# Patient Record
Sex: Female | Born: 1965 | Race: White | Hispanic: No | Marital: Single | Smoking: Current every day smoker
Health system: Southern US, Community
[De-identification: ages and names within clinical notes are randomized; demographics above are authoritative.]

## PROBLEM LIST (undated history)

## (undated) DIAGNOSIS — K449 Diaphragmatic hernia without obstruction or gangrene: Secondary | ICD-10-CM

## (undated) DIAGNOSIS — F32A Depression, unspecified: Secondary | ICD-10-CM

## (undated) DIAGNOSIS — N3281 Overactive bladder: Secondary | ICD-10-CM

## (undated) DIAGNOSIS — I10 Essential (primary) hypertension: Secondary | ICD-10-CM

## (undated) DIAGNOSIS — J439 Emphysema, unspecified: Secondary | ICD-10-CM

## (undated) DIAGNOSIS — T7840XA Allergy, unspecified, initial encounter: Secondary | ICD-10-CM

## (undated) DIAGNOSIS — J189 Pneumonia, unspecified organism: Secondary | ICD-10-CM

## (undated) DIAGNOSIS — F329 Major depressive disorder, single episode, unspecified: Secondary | ICD-10-CM

## (undated) DIAGNOSIS — J45909 Unspecified asthma, uncomplicated: Secondary | ICD-10-CM

## (undated) DIAGNOSIS — E559 Vitamin D deficiency, unspecified: Secondary | ICD-10-CM

## (undated) DIAGNOSIS — R519 Headache, unspecified: Secondary | ICD-10-CM

## (undated) DIAGNOSIS — F419 Anxiety disorder, unspecified: Secondary | ICD-10-CM

## (undated) DIAGNOSIS — Q249 Congenital malformation of heart, unspecified: Secondary | ICD-10-CM

## (undated) DIAGNOSIS — M1990 Unspecified osteoarthritis, unspecified site: Secondary | ICD-10-CM

## (undated) DIAGNOSIS — G4730 Sleep apnea, unspecified: Secondary | ICD-10-CM

## (undated) DIAGNOSIS — M542 Cervicalgia: Secondary | ICD-10-CM

## (undated) DIAGNOSIS — G4700 Insomnia, unspecified: Secondary | ICD-10-CM

## (undated) DIAGNOSIS — E079 Disorder of thyroid, unspecified: Secondary | ICD-10-CM

## (undated) DIAGNOSIS — J449 Chronic obstructive pulmonary disease, unspecified: Secondary | ICD-10-CM

## (undated) HISTORY — PX: CARDIAC CATHETERIZATION: SHX172

## (undated) HISTORY — DX: Unspecified asthma, uncomplicated: J45.909

## (undated) HISTORY — DX: Depression, unspecified: F32.A

## (undated) HISTORY — DX: Anxiety disorder, unspecified: F41.9

## (undated) HISTORY — DX: Vitamin D deficiency, unspecified: E55.9

## (undated) HISTORY — DX: Essential (primary) hypertension: I10

## (undated) HISTORY — DX: Overactive bladder: N32.81

## (undated) HISTORY — DX: Congenital malformation of heart, unspecified: Q24.9

## (undated) HISTORY — DX: Allergy, unspecified, initial encounter: T78.40XA

## (undated) HISTORY — DX: Pneumonia, unspecified organism: J18.9

## (undated) HISTORY — DX: Unspecified osteoarthritis, unspecified site: M19.90

## (undated) HISTORY — DX: Insomnia, unspecified: G47.00

## (undated) HISTORY — DX: Major depressive disorder, single episode, unspecified: F32.9

## (undated) HISTORY — DX: Disorder of thyroid, unspecified: E07.9

## (undated) HISTORY — PX: ANGIOPLASTY: SHX39

## (undated) HISTORY — DX: Emphysema, unspecified: J43.9

## (undated) HISTORY — DX: Diaphragmatic hernia without obstruction or gangrene: K44.9

## (undated) HISTORY — DX: Chronic obstructive pulmonary disease, unspecified: J44.9

---

## 1983-07-21 HISTORY — PX: CHOLECYSTECTOMY: SHX55

## 1990-07-20 HISTORY — PX: TUBAL LIGATION: SHX77

## 1991-07-21 HISTORY — PX: GYNECOLOGIC CRYOSURGERY: SHX857

## 1999-07-21 DIAGNOSIS — J45909 Unspecified asthma, uncomplicated: Secondary | ICD-10-CM

## 1999-07-21 HISTORY — DX: Unspecified asthma, uncomplicated: J45.909

## 2004-07-20 HISTORY — PX: SKIN BIOPSY: SHX1

## 2006-01-26 ENCOUNTER — Ambulatory Visit: Payer: Self-pay | Admitting: Internal Medicine

## 2006-08-23 ENCOUNTER — Emergency Department: Payer: Self-pay | Admitting: Emergency Medicine

## 2006-08-23 ENCOUNTER — Other Ambulatory Visit: Payer: Self-pay

## 2006-10-07 ENCOUNTER — Ambulatory Visit: Payer: Self-pay | Admitting: Cardiovascular Disease

## 2007-06-24 ENCOUNTER — Emergency Department: Payer: Self-pay | Admitting: Emergency Medicine

## 2007-07-07 ENCOUNTER — Emergency Department: Payer: Self-pay | Admitting: Unknown Physician Specialty

## 2007-08-15 ENCOUNTER — Ambulatory Visit: Payer: Self-pay | Admitting: Gastroenterology

## 2008-07-30 ENCOUNTER — Encounter
Admission: RE | Admit: 2008-07-30 | Discharge: 2008-10-28 | Payer: Self-pay | Admitting: Physical Medicine & Rehabilitation

## 2008-07-31 ENCOUNTER — Ambulatory Visit: Payer: Self-pay | Admitting: Physical Medicine & Rehabilitation

## 2008-09-10 ENCOUNTER — Ambulatory Visit: Payer: Self-pay | Admitting: Physical Medicine & Rehabilitation

## 2008-09-14 ENCOUNTER — Ambulatory Visit (HOSPITAL_COMMUNITY)
Admission: RE | Admit: 2008-09-14 | Discharge: 2008-09-14 | Payer: Self-pay | Admitting: Physical Medicine & Rehabilitation

## 2008-10-11 ENCOUNTER — Ambulatory Visit: Payer: Self-pay | Admitting: Physical Medicine & Rehabilitation

## 2008-11-12 ENCOUNTER — Encounter
Admission: RE | Admit: 2008-11-12 | Discharge: 2009-02-10 | Payer: Self-pay | Admitting: Physical Medicine & Rehabilitation

## 2008-11-15 ENCOUNTER — Ambulatory Visit: Payer: Self-pay | Admitting: Physical Medicine & Rehabilitation

## 2008-12-20 ENCOUNTER — Ambulatory Visit: Payer: Self-pay | Admitting: Physical Medicine & Rehabilitation

## 2009-01-14 ENCOUNTER — Ambulatory Visit: Payer: Self-pay | Admitting: Physical Medicine & Rehabilitation

## 2010-01-11 ENCOUNTER — Emergency Department: Payer: Self-pay | Admitting: Unknown Physician Specialty

## 2010-05-26 ENCOUNTER — Emergency Department: Payer: Self-pay | Admitting: Emergency Medicine

## 2010-11-11 ENCOUNTER — Ambulatory Visit: Payer: Self-pay | Admitting: Emergency Medicine

## 2010-12-02 NOTE — Assessment & Plan Note (Signed)
A 45 year old female with back pain of insidious onset greater than 17  months ago.  Treated in the past by primary care using long-acting  opioid as well as breakthrough medicine.  She is also on fairly high  dose benzodiazepine 10 mg t.i.d. and diazepam.  She has not started her  physical therapy that I wrote for last time, she states that the  therapist wanted MRI results.  She had an MRI of the knee done last  year, but the MRI of the back was done 4 or 5 years ago.  She has had an  [place] controlled drug check showing no evidence of multiple providers.  She  has lower extremity numbness, which seemed to relate to her back pain in  terms of co-existence.  She in fact has knee pain as well and sees Dr.  Darrelyn Hillock from [facility].   Her other past medical history of hypothyroidism, asthma, as well as  Plavix.   Her urine drug screen was negative, i.e., consistent with reported  usage.   Her Oswestry score today is 68%.   On examination, in general no acute distress.  Mood and affect  appropriate.  Also mildly anxious.  Extremities without edema.   Motor strength is 5/5 in bilateral deltoid, biceps, grip as well as hip  flexion, knee extension, ankle dorsiflexion.   Deep tendon reflexes are normal in upper and lower extremities.   Sensation normal in the upper and lower extremities with the exception  of decreased left L4 and L5 dermatomes.   She has full range of motion of knees.  No evidence of knee effusion.  Hip range of motion is full and pain free.  Ankle dorsiflexion and  plantar flexion are full and pain free.   IMPRESSION:  Mainly axial lumbar pain with some lower extremity  numbness, question if it is due to spinal nerve root compression versus  a peripheral neuropathy at the base of his head, some type of EMT at her  primary care's office, I do not have the results, it would have been  sometime within the last year or so.   PLAN:  1. In order to more fully  evaluate her constellation of symptoms, we      will need MRI of the lumbar spine without contrast.  2. If MRI of the lumbar spine does not show any compressive lesion      would repeat EMG.  3. The patient already has been trialed on indomethacin per      Orthopedics.  She has tried over-the-counter NSAIDs as well.  She      has tried Tramadol.  None of these have really given her      significant symptom relief.  Has tried Percocet in the past, which      has been helpful, but has not tried hydrocodone per her report.  We      will give her trial of hydrocodone 5/325 t.i.d.  Her urine drug      screen did not show any signs of abuse or misuse.  We will continue      to monitor, however.   I will see her back in about a month after the MRI, further treatment  from there.      Erick Colace, M.D.  Electronically Signed    AEK/MedQ  D:  09/10/2008 16:16:17  T:  09/11/2008 03:07:32  Job #:  [id number]

## 2010-12-02 NOTE — Group Therapy Note (Signed)
REASON FOR REFERRAL:  Back pain.   CHIEF COMPLAINT:  Back pain as well as knee pain.   HISTORY:  Bonnie Gomez is a 45 year old female who gives a history of  back pain of insidious onset of greater than 16 months ago.  She has  been treated in the past by her primary care physician and was placed on  fentanyl patch 50 mcg q.3 days as well as Percocet 10/325 #160 along  with Valium 10 mg p.o. t.i.d. and Ambien 10 mg nightly.  She also has  been treated for hypothyroidism.  She has remained on fairly high dose  of narcotic analgesics in conjunction with benzodiazepines.  Has not  been trialed on any physical therapy.  No injections.  She indicates she  has had lumbar epidural steroid injection in the past.  She has had an  MRI of the back in the past, but I do not have any record of it.  I do  have a record of right knee MRI, which she states shows some bad  arthritis, but actually the report really look normal.  She has had a  subsequent one done since the last MRI of the right knee, which was  performed on August 06, 2007.   Her average pain is 4/10.  She has been on following medications for  pain:  Indomethacin 25 mg b.i.d., Cymbalta 60 mg per day, glucosamine  with chondroitin b.i.d., Lyrica 200 mg b.i.d.  Her other medications  include Singulair, Plavix, amlodipine, Combivent, Spiriva, furosemide,  zolpidem, levothyroxine, WelChol, promethazine, omeprazole, triamterene  and hydrochlorothiazide, aspirin, and dicyclomine.  Her allergies are  none.  Other medications she has tried in the past include tramadol,  which she states does not help, and she states she has been on it for at  least a month straight without any improvement.  She says also they give  her in the emergency room when she goes there at [facility].  Her  pain diagram shows some markings in the neck area as well as low back as  well as bilateral knees and bilateral ankles.  She describes the pain as  sharp,  stabbing, and constant aching currently at a 2/10, interferes  with activity at a 6/10 level.  During the level at the 3/10, sleep is  poor.  Pains with walking, bending, and standing.  Relief from meds is  good.  She has been disabled since 2006.  She needs assist with  household duties and shopping.  Review of systems positive for shortness  of breath and sleep apnea.   PAST SURGICAL HISTORY:  Cholecystectomy.   PAST MEDICAL HISTORY:  Hypothyroidism as well as asthma.  She is on  Plavix, which she states is for heart, although she does not have any  more details.  I do not have any more detailed notes in regards to this.   She denies any illegal drug use, but did admit to getting a Percocet  from a friend of hers about a week ago.  She smokes a half-pack to a  pack per day.   FAMILY HISTORY:  Heart disease, diabetes, and high blood pressure.   PHYSICAL EXAMINATION:  VITAL SIGNS:  Her blood pressure is 121/87, pulse  90, respiratory rate is 18, and sat 89% on room air.  GENERAL:  A well-developed and well-nourished female.  Orientation x3.  Affect is alert.  Gait is normal.  EXTREMITIES:  Without edema.  Her upper and lower extremity strength is  normal.  Coordination is normal in the upper and lower extremities.  Deep tendon reflexes are normal in the upper and lower extremities.  Sensation normal in the upper and lower extremities.  MUSCULOSKELETAL:  Her lumbar spine range of motion is 50% forward  flexion, extension, lateral rotation, and bending.  Neck range of motion  is 100% forward flexion, extension, lateral rotation, and bending.  She  has mild tenderness to palpation in bilateral upper traps, bilateral  posterior paraspinal and cervical, lumbar, and thoracic regions.  No  pain in the hips.  Upper extremity range of motion normal.   IMPRESSION:  Mainly axial lumbar pain appears to be moderate.  Differential includes lumbar degenerative disk versus facet versus   sacroiliac disorder.   Of note is she has never really tried any physical therapy.  We will try  her on this for a month first.  We will check urine drug screen.  Not  convinced that narcotic analgesics have a huge will here.  We will see  her back in a month if her pain does not get better with physical  therapy, we will consider MRI lumbar spine.   In terms of her neck, really this seems to be a secondary complaint.  We  will now pursue further workup at this time.   We will ask Physical Therapy to look into a TENS unit as well.   In terms of the knees, she states she is seeing an orthopedic surgeon  here in [place], cannot give me the name.  She states that repeat  MRIs have been done showing that are severe arthritis.  Once again, I do  not have these and these do not show up on the E-chart System of [facility].      Erick Colace, M.D.  Electronically Signed     AEK/MedQ  D:  07/31/2008 16:20:23  T:  08/01/2008 06:52:52  Job #:  [id number]   cc:   Gelene Mink, MD

## 2010-12-02 NOTE — Procedures (Signed)
NAME:  Bonnie Gomez, Bonnie Gomez              ACCOUNT NO.:  [id number]   MEDICAL RECORD NO.:  [id number]          PATIENT TYPE:  REC   LOCATION:  [facility]                          FACILITY:  [facility]   PHYSICIAN:  Erick Colace, M.D.DATE OF BIRTH:  1966-03-06   DATE OF PROCEDURE:  DATE OF DISCHARGE:                               OPERATIVE REPORT   PROCEDURE:  This is a bilateral L5 dorsal ramus injection, bilateral L4  medial branch block, and bilateral L3 medial branch block under  fluoroscopic guidance.   INDICATIONS:  Lumbar pain only partially responsive to medication  management including narcotic analgesics and interfering with self-care  and mobility.  She has had 50% relief of pain with the last set of  injections on November 15, 2008.  Her current pre-injection pain level is  6/10.   Informed consent was obtained after describing the risks and benefits of  the procedure with the patient.  These include bleeding, bruising,  infection, temporary or permanent paralysis.  She elects to proceed and  has given written consent.  The patient was placed prone on fluoroscopy  table.  Betadine prep and sterile draped.  A 25-gauge 1-1/2-inch needle  was used to anesthetize the skin and subcu tissue with 1% lidocaine x2  mL.  Then, a 22-gauge 5-inch spinal needle was inserted under  fluoroscopic guidance first targeting the left S1 SAP sacral alar  junction, bone contact made, confirmed with lateral imaging.  Omnipaque  180 under live fluoro demonstrated no intravascular uptake and 0.5 mL of  solution containing 1 mL of 4 mg/mL dexamethasone, 2 mL of 0.25%  marcaine were injected.  Then, a left L5 SAP transverse process junction  targeted, bone contact made, confirmed with lateral imaging.  Omnipaque  180 under live fluoro demonstrated no intravascular uptake and 0.5 mL of  dexamethasone-marcaine solution was injected.  Then, a left L4 SAP  transverse process junction targeted, bone contact made,  confirmed with  lateral imaging.  Omnipaque 180 under live fluoro demonstrated no  intravascular uptake and 0.5 mL of dexamethasone-marcaine solution.  The  same procedure was repeated on the right side using same needle  injectate and technique.  The patient tolerated the procedure well.  Pre-  and post-injection vitals stable.  Post-injection instructions given.  Pre-injection pain level 8/10.  Post-injection pain level 5/10.  We will  follow overtime.   Consider RF.  Follow up in a month.      Erick Colace, M.D.  Electronically Signed     AEK/MEDQ  D:  12/20/2008 13:48:54  T:  12/21/2008 03:15:19  Job:  [id number]

## 2010-12-02 NOTE — Assessment & Plan Note (Signed)
Bonnie Gomez returns today follow up of her MRI of the lumbar spine.  She had not had 1 for a few years.  She has had increasing low back pain  as well as lower extremity pain.  She had some decreased left L4-L5  dermatome sensation when I last saw her on September 10, 2008.   Pain level is 5-6/10, interferes with activity at 5/10 level.  She is  able to climb steps.  She is able to drive.  She has problems with sleep  and a blood sugar related problems.   PHYSICAL EXAMINATION:  VITAL SIGNS:  Her blood pressure is 134/88, pulse  79, respirations 18, and sat 95% on room air.  GENERAL:  Obese female in no acute stress.  Orientation x3.  Affect  bright.  Gait is normal.  EXTREMITIES:  She is able to toe walk and heel walk.  She is able to do  partial squat on 1 leg on both sides.  She has good hip, knee, and ankle  range of motion.  She has a normal deep tendon reflexes in the upper  lower extremities.  NEUROLOGIC:  Sensation is intact over the lower extremities today.   I reviewed her MRI with her using a spine model to explain the results.   She has T11-12, T12-L1 shallow disk protrusions, but no significant  neural impingement.  No stenosis in the lumbar spine.  She does have  hypertrophic facet more advanced than would be expected for age at L4-5  and L5-S1.   IMPRESSION:  Lumbar spondylosis without myelopathy.  I believe that the  majority of her pain is in the lower lumbar area, which was what she  point to and what is tender to palpation.  She really does not have any  tenderness around T11-12.  She does have some tenderness around T4-5  area.   We will go ahead and set her up for medial branch blocks.  We will hold  Plavix for 5 days and switched to aspirin 1 a day.   Continue with hydrocodone 5/325 t.i.d., but add Voltaren gel to the back  q.i.d.  She is on Plavix for cardiovascular issues and do not want her  to be on long-term NSAIDs.   I discussed with the patient.  She  agrees with plan.      Erick Colace, M.D.  Electronically Signed     AEK/MedQ  D:  10/11/2008 14:58:07  T:  10/12/2008 02:04:48  Job #:  [id number]

## 2010-12-02 NOTE — Procedures (Signed)
NAME:  Bonnie Gomez, COACHMAN              ACCOUNT NO.:  [id number]   MEDICAL RECORD NO.:  [id number]          PATIENT TYPE:  REC   LOCATION:  [facility]                          FACILITY:  [facility]   PHYSICIAN:  Erick Colace, M.D.DATE OF BIRTH:  10-14-1965   DATE OF PROCEDURE:  DATE OF DISCHARGE:                               OPERATIVE REPORT   PROCEDURES:  Bilateral L5 dorsal ramus injection, bilateral L4 medial  branch block, bilateral L3 medial branch block under fluoroscopic  guidance.   INDICATIONS:  Lumbar pain.  Pain only partially response to medication  management including narcotic analgesics and interferes with self-care  and mobility.   Informed consent was obtained after describing the risks and benefits of  the procedure with the patient, these include bleeding, bruising,  infection, temporary and permanent paralysis.  She elects to proceed and  has given written consent.  The patient was placed prone on the  fluoroscopy table.  Betadine prepped and sterile draped.  A 25-gauge  inch and a half needle was used to anesthetize the skin and subcutaneous  tissue, 1% lidocaine x2 mL.  Then a 22-gauge 5-inch spinal needle was  inserted under fluoroscopic guidance, first shot in the left S1 SAP  sacral ala junction, bone contact made, confirmed with lateral imaging.  Omnipaque 180 under live fluoro demonstrated no intravascular uptake,  then 0.5 mL solution containing 1 mL of 4 mg/mL dexamethasone and 2 mL  of 2% MPF lidocaine were injected.  Then a left L5 SAP transverse  process junction targeted, bone contact made, confirmed with lateral  imaging.  Omnipaque 180 under live fluoro demonstrated no intravascular  uptake, then 0.5 of dexamethasone lidocaine solution was injected.  Then, the left L4 SAP transverse process junction targeted and bone  contact made, confirmed with lateral imaging.  Omnipaque 180 under live  fluoro demonstrated no intravascular uptake, then 0.5 mL of  dexamethasone lidocaine solution was injected.  Same procedure was  repeated on the right side using same needle injectate and technique.  The patient tolerated the procedure well.  Pre and post injection vitals  stable.  Post injection instructions given.      Erick Colace, M.D.  Electronically Signed     AEK/MEDQ  D:  11/15/2008 10:24:33  T:  11/15/2008 23:07:44  Job:  [id number]

## 2011-07-21 DIAGNOSIS — J449 Chronic obstructive pulmonary disease, unspecified: Secondary | ICD-10-CM

## 2011-07-21 HISTORY — PX: COLONOSCOPY: SHX174

## 2011-07-21 HISTORY — PX: UPPER GI ENDOSCOPY: SHX6162

## 2011-07-21 HISTORY — DX: Chronic obstructive pulmonary disease, unspecified: J44.9

## 2011-08-02 ENCOUNTER — Inpatient Hospital Stay: Payer: Self-pay | Admitting: Internal Medicine

## 2011-08-02 LAB — COMPREHENSIVE METABOLIC PANEL
Alkaline Phosphatase: 64 U/L (ref 50–136)
BUN: 6 mg/dL — ABNORMAL LOW (ref 7–18)
Bilirubin,Total: 0.6 mg/dL (ref 0.2–1.0)
Creatinine: 0.87 mg/dL (ref 0.60–1.30)
EGFR (Non-African Amer.): >60
Glucose: 106 mg/dL — ABNORMAL HIGH (ref 65–99)
SGPT (ALT): 12 U/L
Total Protein: 7.1 g/dL (ref 6.4–8.2)

## 2011-08-02 LAB — CBC
HCT: 39.9 % (ref 35.0–47.0)
HGB: 13.4 g/dL (ref 12.0–16.0)
RDW: 13.4 % (ref 11.5–14.5)
WBC: 15.4 10*3/uL — ABNORMAL HIGH (ref 3.6–11.0)

## 2011-08-02 LAB — CK TOTAL AND CKMB (NOT AT ARMC)
CK, Total: 147 U/L (ref 21–215)
CK-MB: <0.5 ng/mL — ABNORMAL LOW (ref 0.5–3.6)

## 2011-08-02 LAB — TROPONIN I: Troponin-I: <0.02 ng/mL

## 2011-08-02 LAB — RAPID INFLUENZA A&B ANTIGENS

## 2011-08-03 LAB — BASIC METABOLIC PANEL
BUN: 9 mg/dL (ref 7–18)
Calcium, Total: 8.7 mg/dL (ref 8.5–10.1)
Glucose: 155 mg/dL — ABNORMAL HIGH (ref 65–99)
Potassium: 3.8 mmol/L (ref 3.5–5.1)
Sodium: 142 mmol/L (ref 136–145)

## 2011-08-03 LAB — CBC WITH DIFFERENTIAL/PLATELET
Basophil #: 0 10*3/uL (ref 0.0–0.1)
Eosinophil %: 0 %
Lymphocyte #: 0.7 10*3/uL — ABNORMAL LOW (ref 1.0–3.6)
MCV: 95 fL (ref 80–100)
Monocyte %: 0.8 %
Neutrophil %: 94.3 %
Platelet: 216 10*3/uL (ref 150–440)
RBC: 4.37 10*6/uL (ref 3.80–5.20)
RDW: 13.3 % (ref 11.5–14.5)
WBC: 15.1 10*3/uL — ABNORMAL HIGH (ref 3.6–11.0)

## 2011-08-04 LAB — CBC WITH DIFFERENTIAL/PLATELET
Basophil #: 0.1 10*3/uL (ref 0.0–0.1)
Basophil %: 0.4 %
Eosinophil #: 0 10*3/uL (ref 0.0–0.7)
HCT: 38.4 % (ref 35.0–47.0)
MCHC: 33.6 g/dL (ref 32.0–36.0)
MCV: 96 fL (ref 80–100)
Monocyte #: 0.6 10*3/uL (ref 0.0–0.7)
Monocyte %: 3.3 %
Neutrophil #: 16.6 10*3/uL — ABNORMAL HIGH (ref 1.4–6.5)
RDW: 13.3 % (ref 11.5–14.5)
WBC: 18 10*3/uL — ABNORMAL HIGH (ref 3.6–11.0)

## 2011-08-05 LAB — WBC: WBC: 17.9 10*3/uL — ABNORMAL HIGH (ref 3.6–11.0)

## 2011-08-08 LAB — CULTURE, BLOOD (SINGLE)

## 2011-11-10 ENCOUNTER — Ambulatory Visit: Payer: Self-pay | Admitting: Cardiovascular Disease

## 2011-12-11 ENCOUNTER — Emergency Department: Payer: Self-pay | Admitting: Emergency Medicine

## 2011-12-11 LAB — CBC
HCT: 44.1 %
HGB: 14.4 g/dL
MCH: 30.4 pg
MCHC: 32.7 g/dL
MCV: 93 fL
Platelet: 236 x10 3/mm 3
RBC: 4.75 X10 6/mm 3
RDW: 13.7 %
WBC: 8.6 x10 3/mm 3

## 2011-12-11 LAB — URINALYSIS, COMPLETE
Bilirubin,UR: NEGATIVE
Glucose,UR: NEGATIVE mg/dL
Ketone: NEGATIVE
Nitrite: NEGATIVE
Ph: 5
Protein: 100
RBC,UR: 2830 /HPF
Specific Gravity: 1.016
Squamous Epithelial: 15
WBC UR: 7 /HPF

## 2011-12-11 LAB — COMPREHENSIVE METABOLIC PANEL WITH GFR
Albumin: 3.1 g/dL — ABNORMAL LOW
Alkaline Phosphatase: 75 U/L
Anion Gap: 9
BUN: 7 mg/dL
Bilirubin,Total: 0.6 mg/dL
Calcium, Total: 8.5 mg/dL
Chloride: 103 mmol/L
Co2: 28 mmol/L
Creatinine: 0.82 mg/dL
EGFR (African American): >60
EGFR (Non-African Amer.): >60
Glucose: 94 mg/dL
Osmolality: 277
Potassium: 3.8 mmol/L
SGOT(AST): 20 U/L
SGPT (ALT): 14 U/L
Sodium: 140 mmol/L
Total Protein: 7.2 g/dL

## 2011-12-11 LAB — TROPONIN I: Troponin-I: <0.02 ng/mL

## 2011-12-11 LAB — CK TOTAL AND CKMB (NOT AT ARMC)
CK, Total: 283 U/L — ABNORMAL HIGH
CK-MB: 0.9 ng/mL

## 2012-09-14 ENCOUNTER — Emergency Department: Payer: Self-pay | Admitting: Emergency Medicine

## 2012-10-18 ENCOUNTER — Emergency Department: Payer: Self-pay | Admitting: Emergency Medicine

## 2012-10-18 LAB — CBC
HCT: 44.8 % (ref 35.0–47.0)
HGB: 15.3 g/dL (ref 12.0–16.0)
RBC: 4.87 10*6/uL (ref 3.80–5.20)
RDW: 14.9 % — ABNORMAL HIGH (ref 11.5–14.5)
WBC: 9.8 10*3/uL (ref 3.6–11.0)

## 2012-10-18 LAB — URINALYSIS, COMPLETE
Glucose,UR: NEGATIVE mg/dL (ref 0–75)
Leukocyte Esterase: NEGATIVE
Nitrite: NEGATIVE
Protein: NEGATIVE
Specific Gravity: 1.018 (ref 1.003–1.030)

## 2012-10-18 LAB — BASIC METABOLIC PANEL
Anion Gap: 6 — ABNORMAL LOW (ref 7–16)
BUN: 10 mg/dL (ref 7–18)
Co2: 28 mmol/L (ref 21–32)
EGFR (African American): >60
Osmolality: 279 (ref 275–301)
Potassium: 3.7 mmol/L (ref 3.5–5.1)

## 2013-07-15 ENCOUNTER — Emergency Department: Payer: Self-pay | Admitting: Emergency Medicine

## 2013-07-15 LAB — RAPID INFLUENZA A&B ANTIGENS

## 2013-12-05 DIAGNOSIS — G4730 Sleep apnea, unspecified: Secondary | ICD-10-CM | POA: Insufficient documentation

## 2013-12-05 DIAGNOSIS — F419 Anxiety disorder, unspecified: Secondary | ICD-10-CM | POA: Insufficient documentation

## 2013-12-05 DIAGNOSIS — G43909 Migraine, unspecified, not intractable, without status migrainosus: Secondary | ICD-10-CM | POA: Insufficient documentation

## 2014-01-15 ENCOUNTER — Ambulatory Visit: Payer: Self-pay | Admitting: Gastroenterology

## 2014-01-16 LAB — PATHOLOGY REPORT

## 2014-02-13 ENCOUNTER — Ambulatory Visit: Payer: Self-pay | Admitting: Gastroenterology

## 2014-02-28 ENCOUNTER — Ambulatory Visit: Payer: Self-pay | Admitting: Gastroenterology

## 2014-03-29 ENCOUNTER — Ambulatory Visit: Payer: Self-pay | Admitting: Unknown Physician Specialty

## 2014-11-11 NOTE — Discharge Summary (Signed)
PATIENT NAME:  Bonnie Gomez, Bonnie Gomez MR#:  [id number] DATE OF BIRTH:  1966/01/21  DATE OF ADMISSION:  08/02/2011 DATE OF DISCHARGE:  08/06/2011  PRIMARY CARE PHYSICIAN: Lorelee Market, MD  REASON FOR ADMISSION: Acute respiratory failure with chronic obstructive pulmonary disease exacerbation and pneumonia.   DISCHARGE DIAGNOSES:  1. Acute on chronic hypoxic respiratory failure due to pneumonia and acute chronic obstructive pulmonary disease exacerbation. 2. Bilateral pneumonia, suspect atypical versus community-acquired pneumonia.  3. Acute hypoxic respiratory failure.  4. Leukocytosis. 5. Chronic back pain. 6. History of migraines. 7. History of fibromyalgia. 8. History of anxiety. 9. History of sleep apnea; the patient is on nocturnal oxygen. 10. Ongoing tobacco abuse.   DISCHARGE DISPOSITION: Home.   DISCHARGE MEDICATIONS:  1. Prednisone taper as written on prescription.  2. Omnicef 300 mg p.o. every 12 hours x2 days. 3. Advair Diskus 250/50 one dose inhaled twice a day. 4. Spiriva HandiHaler one cap inhaled daily.  5. Tylenol 325 mg 1 to 2 tablets p.o. every 4 to 6 hours p.r.n. pain.  6. Norco 5/325 mg one tab p.o. every 12 hours p.r.n. pain.  7. Ambien 5 mg p.o. at bedtime p.r.n. insomnia. 8. Albuterol metered dose inhaler 1 to 2 puffs inhaled every 4 to 6 hours p.r.n.  9. Valium 10 mg p.o. twice a day p.r.n.  10. Vitamin D3 5000 international units two caps p.o. daily.   DISCHARGE ACTIVITY: As tolerated.   DISCHARGE DIET: Regular.   DISCHARGE INSTRUCTIONS:  1. Take medications as prescribed.  2. Return to the emergency department for recurrence of symptoms or for worsening shortness of breath, cough, wheezing, fever, chills, or chest pain.   FOLLOWUP INSTRUCTIONS: Follow-up with Dr. Lorelee Market within 1 to 2 weeks.   REFERRALS: The patient is being referred to Dr. Humphrey Rolls of pulmonology within 2 to 3 weeks for management of chronic obstructive pulmonary disease.    PROCEDURES: Chest x-ray, PA and lateral, from 08/02/2011: Suggestion of interstitial pulmonary opacities raising the possibly of etiology of atypical infection.   PERTINENT LABS/STUDIES: Complete metabolic panel normal on admission, with the exception of serum albumin slightly low at 3.2.  Troponin negative on admission.   CBC normal on admission, except for WBC 15.4. WBC 13.2 on the day of discharge.  Blood cultures x2 from 08/02/2011: No growth to date.  Sputum culture revealed normal flora, on 08/03/2011.   Serum influenza antigen was negative for influenza A and B from 08/02/2011.  BRIEF HISTORY/HOSPITAL COURSE: The patient is a 49 year old female with past medical history of chronic obstructive pulmonary disease, ongoing tobacco abuse, chronic back pain, migraines, fibromyalgia, anxiety, sleep apnea, and ongoing tobacco abuse who presented to the emergency department with complaints of shortness of breath in addition to cough and wheezing. Please see dictated admission History and Physical for pertinent details surrounding the onset of this hospitalization and please see below for further details.  1. Acute hypoxic respiratory failure - due to pneumonia leading to acute chronic obstructive pulmonary disease exacerbation resulting in shortness of breath, cough, and wheezing. Chest x-ray was obtained in the ER and was suggestive of bilateral pulmonary opacities raising the possibility of atypical infection. Blood and sputum cultures were obtained and the patient was empirically started on IV antibiotics and admitted to the medical floor and also kept on supplemental oxygen via nasal cannula for hypoxia. She was also maintained on IV steroids for chronic obstructive pulmonary disease exacerbation in addition to bronchodilator support, and she was also maintained on Advair and  Spiriva and also given bronchodilators with nebulizers and MDIs. With the measures mentioned above, the patient's overall  clinical condition has significantly improved with resolution of her shortness of breath and wheezing and her cough has significantly improved. Blood culture did not reveal any growth to date. Sputum culture revealed normal flora. Once her condition was noted to have improved, she was transitioned off IV steroids to oral prednisone taper which she will continue as an outpatient. Her IV antibiotics were switched to p.o., which she will also continue as an outpatient. She has been successfully weaned off oxygen and has normal oxygen saturations on the day of discharge on room air at rest and also with ambulation. She has ambulated well without any shortness of breath.  2. She was noted to have leukocytosis and this could be steroid induced versus from pneumonia and her WBC count has improved overall and should hopefully continue to improve as her steroids are being tapered. She has been afebrile.  3. Her chronic back pain has been stable as have her history of migraines, fibromyalgia, and anxiety and she is to resume her home medications as before.  4. For sleep apnea, she is to continue nocturnal oxygen as previously advised. 5. For tobacco abuse, the patient was strongly counseled on the importance of smoking cessation. The patient will be referred to pulmonology as an outpatient for ongoing management of her chronic obstructive pulmonary disease.   On 08/06/2011, the patient was hemodynamically stable and without any shortness of breath or wheezing and her cough had significantly improved and she was felt to be stable for discharge home with close outpatient follow-up to which the patient was agreeable.  TIME SPENT ON DISCHARGE: Greater than 30 minutes. ____________________________ Romie Jumper, MD knl:[id number] D: 08/09/2011 23:08:16 ET     T: 08/10/2011 13:39:48 ET       JOB#: [id number] cc: Romie Jumper, MD, <Dictator> Meindert A. Brunetta Genera, MD Allyne Gee, MD Romie Jumper MD ELECTRONICALLY  SIGNED 08/18/2011 18:52

## 2014-11-11 NOTE — H&P (Signed)
PATIENT NAME:  Bonnie Gomez, Bonnie Gomez MR#:  [id number] DATE OF BIRTH:  1965-08-07  DATE OF ADMISSION:  08/02/2011  REFERRING PHYSICIAN: Dr. Benjaman Lobe  FAMILY PHYSICIAN: Dr. Brunetta Genera   REASON FOR ADMISSION: Acute respiratory failure with chronic obstructive pulmonary disease exacerbation and pneumonia.   HISTORY OF PRESENT ILLNESS: The patient is a 49 year old female with a history of chronic obstructive pulmonary disease/tobacco abuse as well as sleep apnea, on nocturnal oxygen who presents to the Emergency Room with a 1 to 2 day history of progressive shortness of breath, cough, and fatigue. In the Emergency Room, the patient was noted to be febrile and tachycardic. She was also hypoxic. She was started on oxygen and given IV steroids with multiple SVNs with no improvement of her symptoms. Chest x-ray returned abnormal consistent with atypical pneumonia. She is now admitted for further evaluation.   PAST MEDICAL HISTORY:  1. Chronic obstructive pulmonary disease/tobacco abuse.  2. Sleep apnea, on nocturnal oxygen.  3. Chronic low back pain.  4. Anxiety.  5. Migraine headaches.  6. Status post bilateral tubal ligation.  7. Status post cholecystectomy.   MEDICATIONS:  1. Ambien 10 mg p.o. at bedtime p.r.n. sleep.  2. Oxygen at 2 liters per minute per nasal cannula at bedtime.  3. Albuterol 2.5 mg SVNs every four hours p.r.n. shortness of breath.  4. Ventolin 2 puffs q.i.d.  5. Valium 10 mg p.o. b.i.d.   ALLERGIES: No known drug allergies.   SOCIAL HISTORY: The patient was smoking up until 2 to 3 days ago. Denies alcohol abuse.   FAMILY HISTORY: Positive for hypertension but otherwise unremarkable.   REVIEW OF SYSTEMS: CONSTITUTIONAL: The patient has had fever but no change in weight. EYES: No blurred or double vision. No glaucoma. ENT: No tinnitus or hearing loss. No nasal discharge or bleeding. No difficulty swallowing. RESPIRATORY: No hemoptysis. No painful respiration. CARDIOVASCULAR: No  chest pain or orthopnea. No palpitations or syncope. GI: No nausea, vomiting, or diarrhea. No abdominal pain. No change in bowel habits.  GU: No dysuria or hematuria. No incontinence. ENDOCRINE: No polyuria or polydipsia. No heat or cold intolerance. HEMATOLOGIC: The patient denies anemia, easy bruising, or bleeding. LYMPHATIC: No swollen glands. MUSCULOSKELETAL: The patient denies pain in her neck, shoulders, knees, or hips. Has chronic back pain. No gout. NEUROLOGIC: No numbness or migraines. Denies stroke or seizures. PSYCH: The patient denies anxiety, insomnia, or depression.   PHYSICAL EXAMINATION:  GENERAL: The patient is in mild respiratory distress.   VITAL SIGNS: Currently remarkable for a blood pressure of 137/83 with a heart rate of 108 and a respiratory rate of 24. Temperature is 99.3.   HEENT: Normocephalic, atraumatic. Pupils equally round and reactive to light and accommodation. Extraocular movements are intact. Sclerae are anicteric. Conjunctivae are clear.  Oropharynx is dry but clear.   NECK: Supple without jugular venous distention or bruits. No adenopathy or thyromegaly is noted.   LUNGS: Scattered wheezes and rhonchi. No dullness or rales. Respiratory effort is mildly increased.   CARDIAC: Rapid rate with a regular rhythm. Normal S1 and S2. No significant rubs, murmurs, or gallops. PMI is nondisplaced. Chest wall is nontender.   ABDOMEN: Soft, nontender, with normoactive bowel sounds. No organomegaly or masses were appreciated. No hernias or bruits were noted.   EXTREMITIES: Without clubbing, cyanosis, or edema. Pulses were 2+ bilaterally.   SKIN: Warm and dry without rash or lesions.   NEUROLOGIC: Cranial nerves II through XII grossly intact. Deep tendon reflexes were symmetric. Motor and  sensory examination is nonfocal.   PSYCH: Exam revealed a patient who was alert and oriented to person, place, and time. She was cooperative and used good judgment.   LABORATORY,  DIAGNOSTIC, AND RADIOLOGICAL DATA: EKG revealed sinus tachycardia with no acute ischemic changes. Chest x-ray revealed bilateral opacities worrisome for atypical pneumonia. White count was 15.4 with a hemoglobin of 13.4. Glucose 106 with a BUN of 6 and a creatinine of 0.87. GFR was greater than 60. Troponin was less than 0.02. Flu swab was negative.   ASSESSMENT:  1. Bilateral atypical pneumonia.  2. Acute respiratory failure.  3. Chronic obstructive pulmonary disease exacerbation. 4. Chronic back pain.  5. Anxiety.   PLAN: The patient will be admitted to the floor with oxygen as well as IV steroids, IV antibiotics, Xopenex and Atrovent SVNs. We will begin Mucinex, Advair, and Spiriva. We will send off sputum for culture. Wean oxygen as tolerated. Continue outpatient medications. Follow up chest x-ray and labs in the morning. Further treatment and evaluation will depend upon the patient's progress.   TOTAL TIME SPENT:  50 minutes.    ____________________________ Leonie Douglas Doy Hutching, MD jds:bjt D: 08/02/2011 17:43:12 ET T: 08/03/2011 06:07:11 ET JOB#: [id number]  cc: Leonie Douglas. Doy Hutching, MD, <Dictator> Meindert A. Brunetta Genera, MD Jennell Janosik Lennice Sites MD ELECTRONICALLY SIGNED 08/03/2011 11:37

## 2014-12-11 IMAGING — CR DG CHEST 2V
1 series · 2 of 2 positions shown · non-contrast
Comparison: none

REASON FOR EXAM: sob, cough, congestion
COMMENTS:   LMP: One week ago

[Series 1: w chest pa · 0.14mm/px · 2 of 2 slices shown]
[im 1/2]
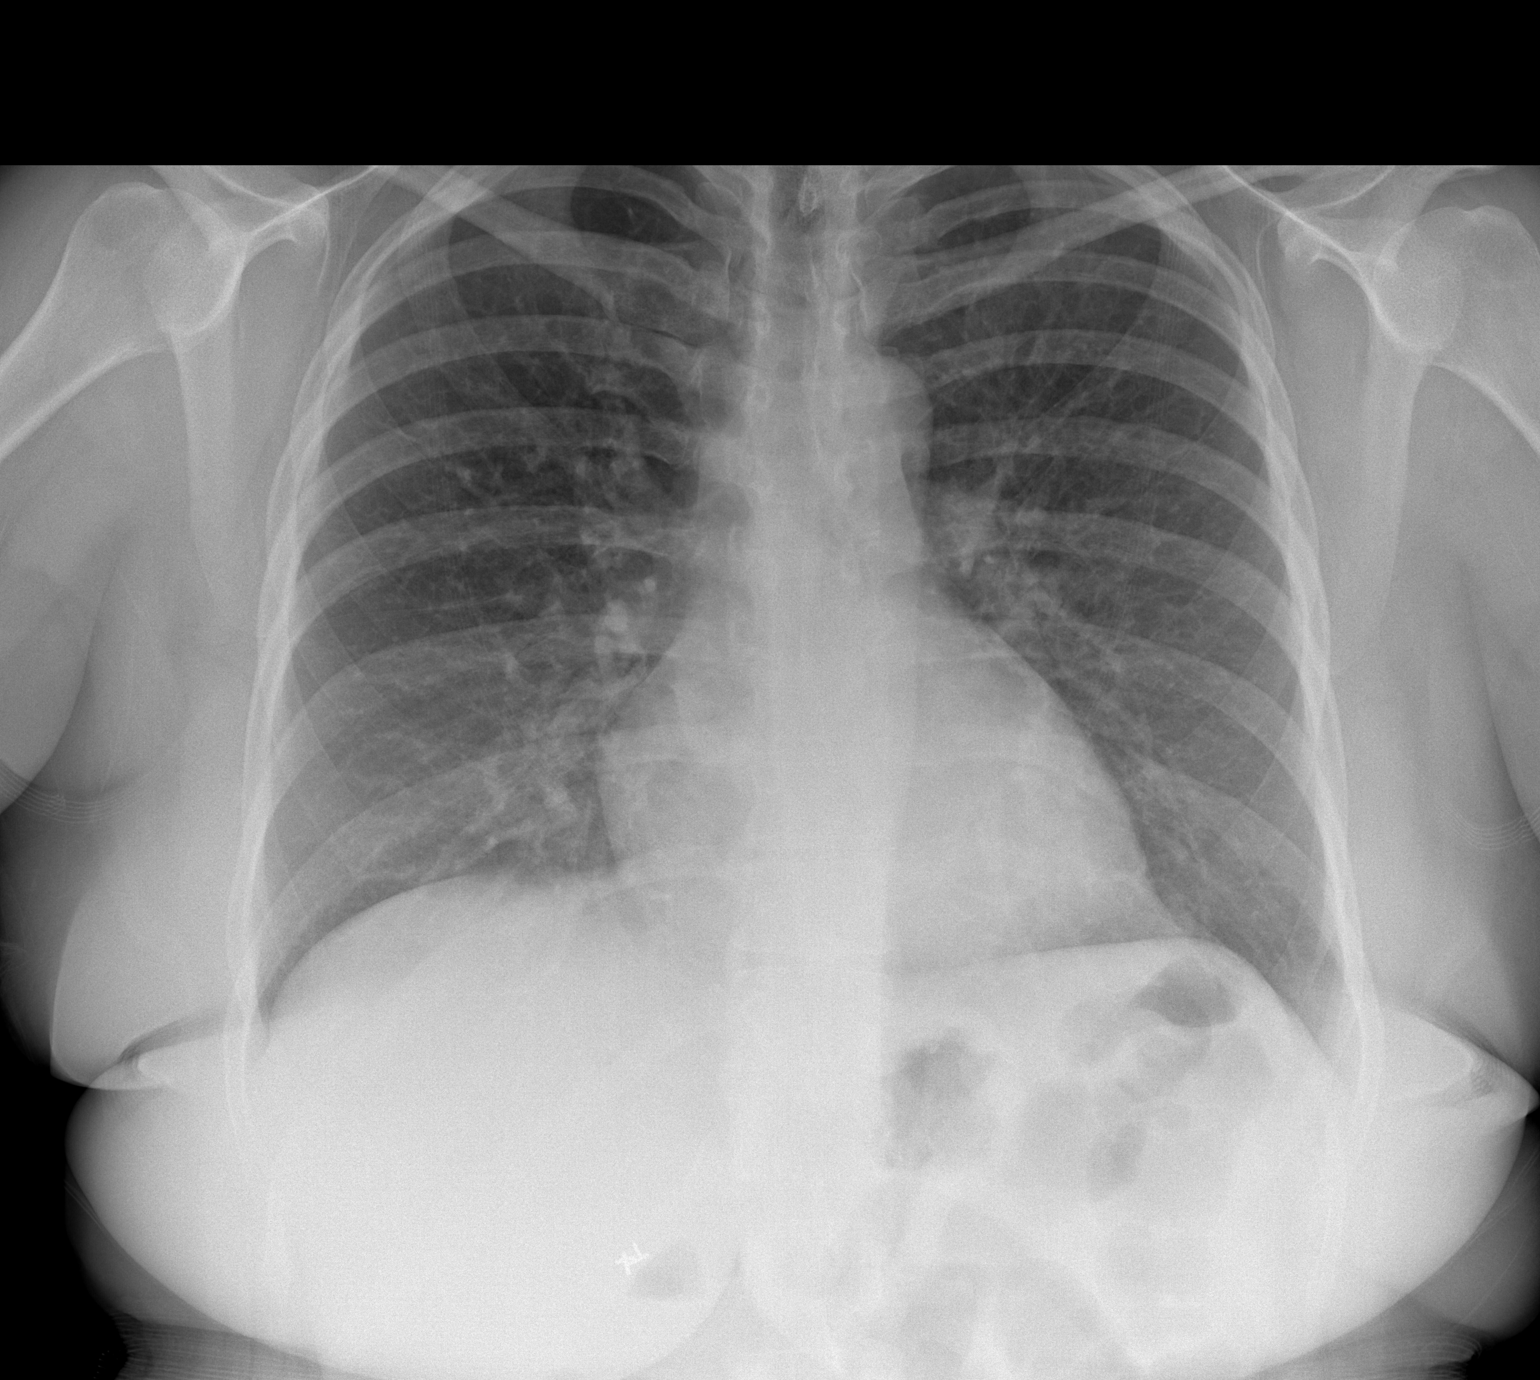
[im 2/2]
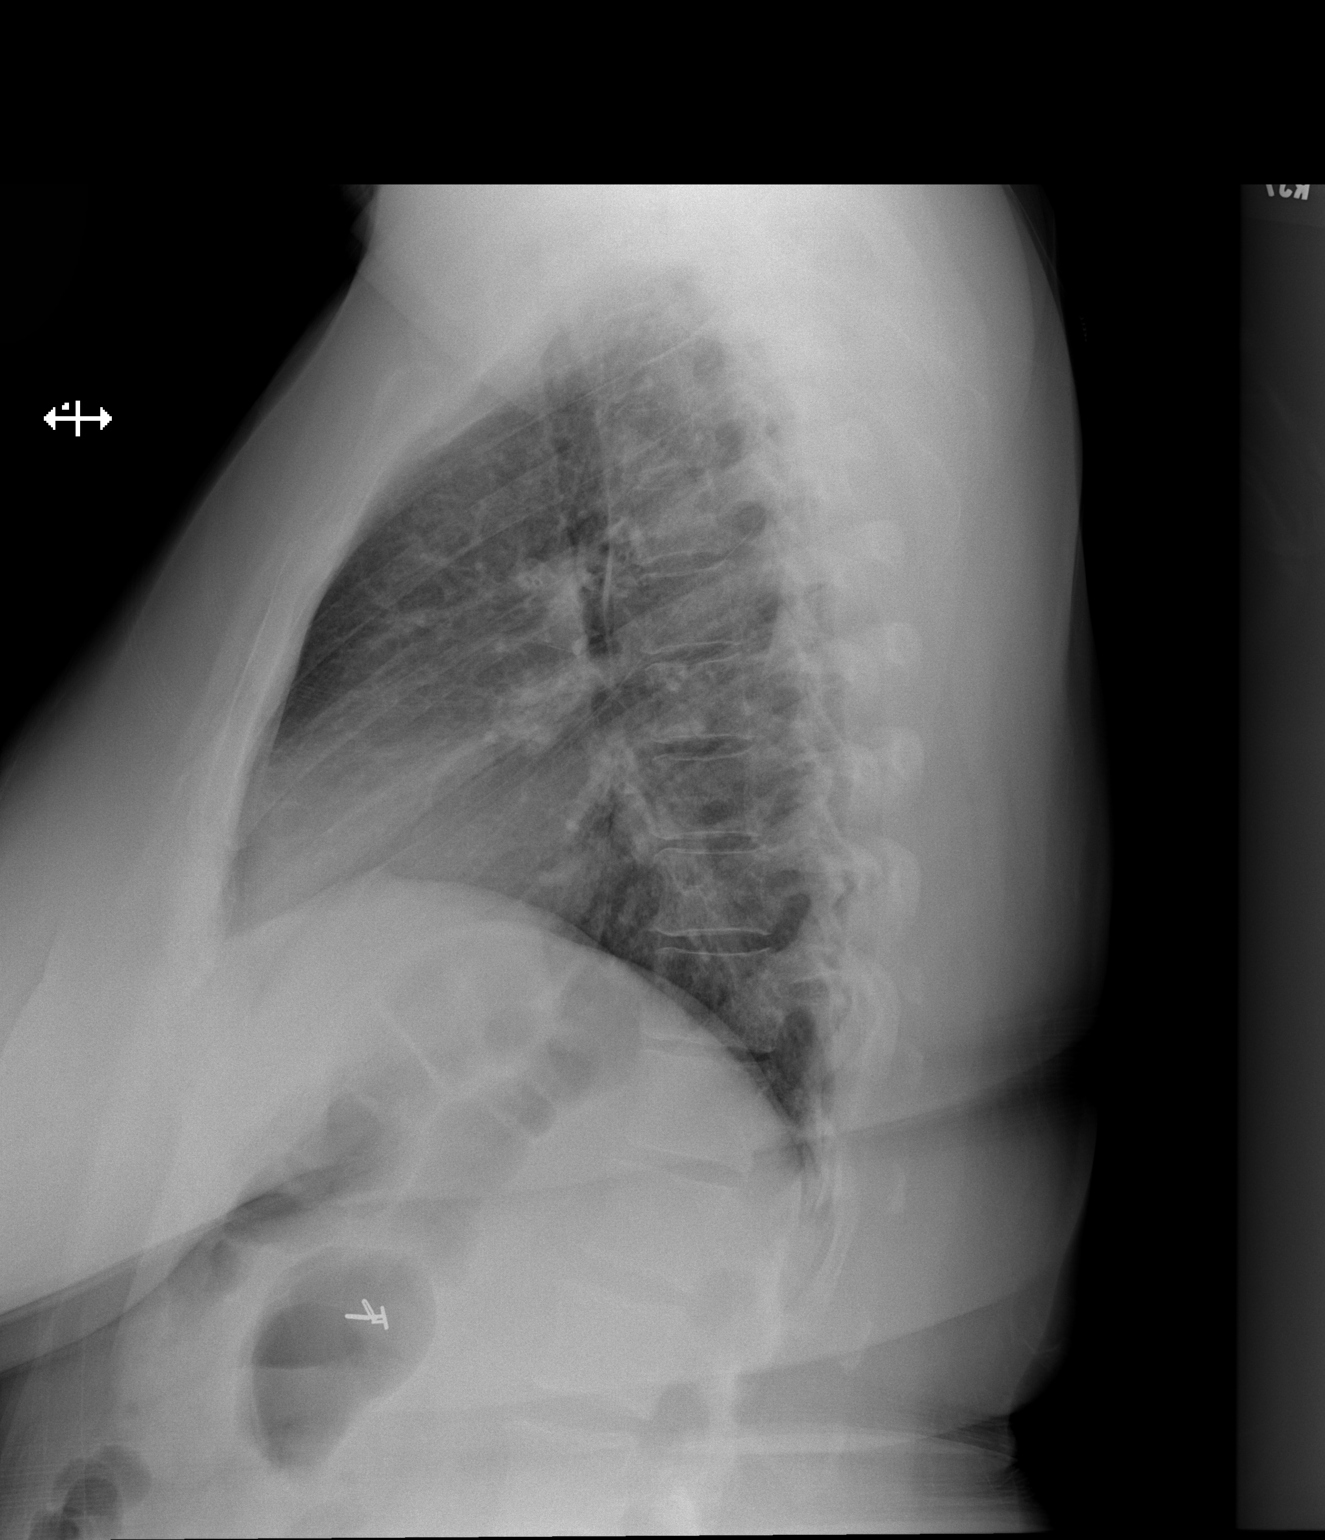

[2 of 2 positions shown; findings below may reference images not displayed]

PROCEDURE:     DXR - DXR CHEST PA (OR AP) AND LATERAL  - September 14, 2012 [DATE]

RESULT:     Comparison is made to the previous examination 11 December, 2011.
The lungs are clear. The heart and pulmonary vessels are normal. The bony
and mediastinal structures are unremarkable. There is no effusion. There is
no pneumothorax or evidence of congestive failure.
IMPRESSION: No acute cardiopulmonary disease.

[REDACTED]

## 2015-04-11 ENCOUNTER — Ambulatory Visit (INDEPENDENT_AMBULATORY_CARE_PROVIDER_SITE_OTHER): Payer: Medicare Other | Admitting: General Surgery

## 2015-04-11 ENCOUNTER — Encounter: Payer: Self-pay | Admitting: General Surgery

## 2015-04-11 VITALS — BP 124/66 | HR 60 | Resp 12 | Ht 63.0 in | Wt 193.0 lb

## 2015-04-11 DIAGNOSIS — N949 Unspecified condition associated with female genital organs and menstrual cycle: Secondary | ICD-10-CM

## 2015-04-11 DIAGNOSIS — N9089 Other specified noninflammatory disorders of vulva and perineum: Secondary | ICD-10-CM

## 2015-04-11 NOTE — Patient Instructions (Addendum)
Wait for biopsy results to determine when to reuturn

## 2015-04-11 NOTE — Progress Notes (Signed)
Patient ID: Bonnie Gomez, female   DOB: 1966/03/02, 49 y.o.   MRN: [id number]  Chief Complaint  Patient presents with  . Hemorrhoids    HPI Bonnie Gomez is a 49 y.o. female here today for an evaluation of hemorrhoids. She does report a "growth" on her rectum, does bleed and irritated. She states she has had this for about 2 years. Denies blood in her stool. She does use soap to clean area in the shower.  HPI  Past Medical History  Diagnosis Date  . 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 Pneumonia     2013  . Asthma 2001  . Arthritis   . Anxiety   . Cancer 2013    skin  . Thyroid disease   . Depression   . Hypertension   . Overactive bladder   . Allergy   . COPD (chronic obstructive pulmonary disease) 2013  . Vitamin D deficiency   . Insomnia   . Emphysema lung   . Hiatal hernia     Past Surgical History  Procedure Laterality Date  . Cholecystectomy  1985  . Tubal ligation  1992  . Angioplasty  2008, 2012  . Upper gi endoscopy  2013    [place]  . Colonoscopy  2013  . Skin biopsy  2006    Family History  Problem Relation Age of Onset  . Cancer Mother     lung  . Cancer Father     lung  . Cancer Maternal Grandmother     breast    Social History Social History  Substance Use Topics  . Smoking status: Current Every Day Smoker -- 1.00 packs/day    Types: Cigarettes  . Smokeless tobacco: None  . Alcohol Use: No    Allergies  Allergen Reactions  . Varenicline Other (See Comments)    Agitation  . Duloxetine Hcl Anxiety    Agitation    Current Outpatient Prescriptions  Medication Sig Dispense Refill  . albuterol (PROAIR HFA) 108 (90 BASE) MCG/ACT inhaler Inhale into the lungs.    Marland Kitchen albuterol (PROVENTIL) (2.5 MG/3ML) 0.083% nebulizer solution INHALE 1 VIAL USING NEBULIZER EVERY SIX HOURS AS NEEDED    . aspirin EC 81 MG tablet Take by mouth.    . BD INTEGRA SYRINGE 25G X 1" 3 ML MISC USE WITH B12  5  . CHANTIX CONTINUING MONTH PAK 1  MG tablet 1 TAB(S) ORAL TWO TIMES A DAY  3  . Cholecalciferol (D 1000) 1000 UNITS capsule Take by mouth.    . clonazePAM (KLONOPIN) 1 MG tablet Take by mouth.    . fentaNYL (DURAGESIC - DOSED MCG/HR) 75 MCG/HR PLACE ONE PATCH ONTO THE SKIN EVERY 72 HOURS  0  . fluconazole (DIFLUCAN) 150 MG tablet 1 ORAL EVERY DAY  0  . Fluticasone Furoate-Vilanterol 100-25 MCG/INH AEPB Inhale into the lungs.    . furosemide (LASIX) 40 MG tablet Take 40 mg by mouth daily.  5  . gabapentin (NEURONTIN) 300 MG capsule Take 300 mg by mouth 3 (three) times daily.  4  . ketoconazole (NIZORAL) 2 % cream APPLY A SMALL AMOUNT TOPICALLY TWO TIMES A DAY  2  . ketoconazole (NIZORAL) 2 % shampoo See admin instructions.  6  . levothyroxine (SYNTHROID, LEVOTHROID) 50 MCG tablet Take 50 mcg by mouth every morning.  2  . oxyCODONE-acetaminophen (PERCOCET) 7.5-325 MG per tablet TAKE 1 TABLET BY MOUTH EVERY 6 HOURS AND 1 TABLET AT  BEDTIME AS NEEDED  0  . progesterone (PROMETRIUM) 100 MG capsule Take 100 mg by mouth daily.  5  . tiotropium (SPIRIVA) 18 MCG inhalation capsule Place into inhaler and inhale.    . traMADol (ULTRAM) 50 MG tablet Take by mouth.    . traZODone (DESYREL) 100 MG tablet Take 300 mg by mouth.     . VESICARE 5 MG tablet      No current facility-administered medications for this visit.    Review of Systems Review of Systems  Constitutional: Negative.   Respiratory: Negative.   Cardiovascular: Negative.     Blood pressure 124/66, pulse 60, resp. rate 12, height 5\' 3"  (1.6 m), weight 193 lb (87.544 kg), SpO2 97 %.  Physical Exam Physical Exam  Constitutional: She is oriented to person, place, and time. She appears well-developed and well-nourished.  Cardiovascular: Normal rate, regular rhythm and normal heart sounds.   Pulmonary/Chest: Effort normal. She has rhonchi in the left lower field.  Abdominal: Soft. Normal appearance and normal aorta.  Genitourinary:     2x4 cm mass at the base of  sacrum   Lymphadenopathy:       Right: No inguinal adenopathy present.       Left: No inguinal adenopathy present.  Neurological: She is alert and oriented to person, place, and time.  Skin: Skin is warm and dry.  Psychiatric: Her behavior is normal.    Data Reviewed PCP notes.  Assessment    Pigmented mass of the perineum.     Plan    This is likely simply a traumatic skin tag, but the unusual pigmentation warrants biopsy prior to formal excision. The patient was amenable, and 10 mL of 0.5% Xylocaine with 0.25% Marcaine with 1-200,000 units of epinephrine was utilized. Chlor prep was applied to the skin. 2, 2.5 mm punch biopsies were obtained. The skin defect was closed with 5-0 nylon sutures. A dry dressing was applied.  The patient will be contacted when the biopsy results are retrieved. If this is a benign lesion this will likely be able to be excised as an office procedure under local anesthesia. If malignancy is identified a different approach may be necessary.       PCP:  Alfred Levins 04/11/2015, 8:26 PM

## 2015-04-15 ENCOUNTER — Telehealth: Payer: Self-pay

## 2015-04-15 NOTE — Telephone Encounter (Signed)
Notified patient as instructed, patient pleased. Discussed follow-up appointments, patient agrees. Patient scheduled for excision of area on 04/29/15.

## 2015-04-15 NOTE — Telephone Encounter (Signed)
-----   Message from Robert Bellow, MD sent at 04/15/2015  3:25 PM EDT ----- Please notify the patient the biopsy was benign. We can arrange for an office excision when convenient. ----- Message -----    From: [facility]    Sent: 04/15/2015   3:12 PM      To: Robert Bellow, MD

## 2015-04-18 ENCOUNTER — Ambulatory Visit (INDEPENDENT_AMBULATORY_CARE_PROVIDER_SITE_OTHER): Payer: Medicare Other | Admitting: *Deleted

## 2015-04-18 DIAGNOSIS — N9089 Other specified noninflammatory disorders of vulva and perineum: Secondary | ICD-10-CM

## 2015-04-18 DIAGNOSIS — N949 Unspecified condition associated with female genital organs and menstrual cycle: Secondary | ICD-10-CM

## 2015-04-18 NOTE — Progress Notes (Signed)
Patient came in today for a wound check/biopsy site.  The nodule is clean, with no signs of infection noted. Plan for office excision. Follow up as scheduled. The patient is aware to call back for any questions or concerns.

## 2015-04-18 NOTE — Patient Instructions (Signed)
The patient is aware to call back for any questions or concerns.  

## 2015-04-29 ENCOUNTER — Ambulatory Visit (INDEPENDENT_AMBULATORY_CARE_PROVIDER_SITE_OTHER): Payer: Medicare Other | Admitting: General Surgery

## 2015-04-29 ENCOUNTER — Encounter: Payer: Self-pay | Admitting: General Surgery

## 2015-04-29 ENCOUNTER — Ambulatory Visit: Payer: Medicare Other | Admitting: General Surgery

## 2015-04-29 VITALS — BP 132/74 | HR 82 | Resp 14 | Wt 189.0 lb

## 2015-04-29 DIAGNOSIS — D215 Benign neoplasm of connective and other soft tissue of pelvis: Secondary | ICD-10-CM | POA: Diagnosis not present

## 2015-04-29 DIAGNOSIS — A630 Anogenital (venereal) warts: Secondary | ICD-10-CM

## 2015-04-29 DIAGNOSIS — N9089 Other specified noninflammatory disorders of vulva and perineum: Secondary | ICD-10-CM

## 2015-04-29 MED ORDER — HYDROCODONE-ACETAMINOPHEN 5-325 MG PO TABS: 1.0000 | ORAL_TABLET | ORAL | Status: DC | PRN

## 2015-04-29 NOTE — Progress Notes (Signed)
Patient ID: Bonnie Gomez, female   DOB: August 25, 1965, 49 y.o.   MRN: [id number]  Chief Complaint  Patient presents with  . Procedure    mass exc    HPI Bonnie Gomez is a 49 y.o. female here today for a perirenal mass excision. Previous punch biopsy showed that this was a condyloma without evidence of malignancy. HPI  Past Medical History  Diagnosis Date  . 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 Pneumonia     2013  . Asthma 2001  . Arthritis   . Anxiety   . Cancer ([facility]) 2013    skin  . Thyroid disease   . Depression   . Hypertension   . Overactive bladder   . Allergy   . COPD (chronic obstructive pulmonary disease) ([place]) 2013  . Vitamin D deficiency   . Insomnia   . Emphysema lung ([place])   . Hiatal hernia     Past Surgical History  Procedure Laterality Date  . Cholecystectomy  1985  . Tubal ligation  1992  . Angioplasty  2008, 2012  . Upper gi endoscopy  2013    [facility]  . Colonoscopy  2013  . Skin biopsy  2006    Family History  Problem Relation Age of Onset  . Cancer Mother     lung  . Cancer Father     lung  . Cancer Maternal Grandmother     breast    Social History Social History  Substance Use Topics  . Smoking status: Current Every Day Smoker -- 1.00 packs/day    Types: Cigarettes  . Smokeless tobacco: None  . Alcohol Use: No    Allergies  Allergen Reactions  . Varenicline Other (See Comments)    Agitation  . Duloxetine Hcl Anxiety    Agitation    Current Outpatient Prescriptions  Medication Sig Dispense Refill  . albuterol (PROAIR HFA) 108 (90 BASE) MCG/ACT inhaler Inhale into the lungs.    Marland Kitchen albuterol (PROVENTIL) (2.5 MG/3ML) 0.083% nebulizer solution INHALE 1 VIAL USING NEBULIZER EVERY SIX HOURS AS NEEDED    . aspirin EC 81 MG tablet Take by mouth.    . BD INTEGRA SYRINGE 25G X 1" 3 ML MISC USE WITH B12  5  . CHANTIX CONTINUING MONTH PAK 1 MG tablet 1 TAB(S) ORAL TWO TIMES A DAY  3  . Cholecalciferol (D 1000) 1000  UNITS capsule Take by mouth.    . clonazePAM (KLONOPIN) 1 MG tablet Take by mouth.    . fentaNYL (DURAGESIC - DOSED MCG/HR) 75 MCG/HR PLACE ONE PATCH ONTO THE SKIN EVERY 72 HOURS  0  . fluconazole (DIFLUCAN) 150 MG tablet 1 ORAL EVERY DAY  0  . Fluticasone Furoate-Vilanterol 100-25 MCG/INH AEPB Inhale into the lungs.    . furosemide (LASIX) 40 MG tablet Take 40 mg by mouth daily.  5  . gabapentin (NEURONTIN) 300 MG capsule Take 300 mg by mouth 3 (three) times daily.  4  . HYDROcodone-acetaminophen (NORCO) 5-325 MG tablet Take 1-2 tablets by mouth every 4 (four) hours as needed for moderate pain. 20 tablet 0  . ketoconazole (NIZORAL) 2 % cream APPLY A SMALL AMOUNT TOPICALLY TWO TIMES A DAY  2  . ketoconazole (NIZORAL) 2 % shampoo See admin instructions.  6  . levothyroxine (SYNTHROID, LEVOTHROID) 50 MCG tablet Take 50 mcg by mouth every morning.  2  . oxyCODONE-acetaminophen (PERCOCET) 7.5-325 MG per tablet TAKE 1 TABLET BY MOUTH  EVERY 6 HOURS AND 1 TABLET AT BEDTIME AS NEEDED  0  . progesterone (PROMETRIUM) 100 MG capsule Take 100 mg by mouth daily.  5  . tiotropium (SPIRIVA) 18 MCG inhalation capsule Place into inhaler and inhale.    . traMADol (ULTRAM) 50 MG tablet Take by mouth.    . traZODone (DESYREL) 100 MG tablet Take 300 mg by mouth.     . VESICARE 5 MG tablet      No current facility-administered medications for this visit.    Review of Systems Review of Systems  Constitutional: Negative.   Respiratory: Negative.   Cardiovascular: Negative.     Blood pressure 132/74, pulse 82, resp. rate 14, weight 189 lb (85.73 kg).  Physical Exam Physical Exam  Genitourinary:       Data Reviewed Condyloma on biopsy.  Assessment    Condyloma of the perineum, symptomatic.    Plan    The plans for excision was reviewed. A total of 20 mL of 0.5% Xylocaine with 0.25% Marcaine with 1-200,000 of epinephrine was utilized well tolerated. Chlor prep was applied pre-and post local and  acetic injection. The area was excised through elliptical incision. Hemostasis was with interrupted 4-0 Vicryls figure-of-eight sutures. The wound was closed in multiple layers with interrupted 4-0 Vicryls figure-of-eight sutures. The skin was closed with a running 4-0 Vicryls subcuticular suture. Telfa and Tegaderm applied.  A prescription for Norco 5/325, #20 with the inscription one 1-2 by mouth every 4 hours when necessary for pain was provided, no refills. The patient reports that she is no longer making use of Percocet 7.5 as noted on her medication list.  She will follow-up in 8 days for wound evaluation with the nurse.     PCP: Threasa Alpha, Np   Robert Bellow 04/29/2015, 4:18 PM

## 2015-04-29 NOTE — Patient Instructions (Addendum)
Patient to return in eight days.

## 2015-05-02 ENCOUNTER — Telehealth: Payer: Self-pay | Admitting: *Deleted

## 2015-05-02 NOTE — Telephone Encounter (Signed)
-----   Message from Robert Bellow, MD sent at 05/02/2015  2:25 PM EDT ----- Please notify the patient that the biopsy did not show any cancer. Follow-up as previously scheduled. ----- Message -----    From: [facility]    Sent: 05/02/2015  11:46 AM      To: Robert Bellow, MD

## 2015-05-02 NOTE — Telephone Encounter (Signed)
Notified patient as instructed, patient pleased. Discussed follow-up appointments, patient agrees  

## 2015-05-07 ENCOUNTER — Ambulatory Visit (INDEPENDENT_AMBULATORY_CARE_PROVIDER_SITE_OTHER): Payer: Medicare Other | Admitting: *Deleted

## 2015-05-07 DIAGNOSIS — N949 Unspecified condition associated with female genital organs and menstrual cycle: Secondary | ICD-10-CM

## 2015-05-07 DIAGNOSIS — N9089 Other specified noninflammatory disorders of vulva and perineum: Secondary | ICD-10-CM

## 2015-05-07 NOTE — Progress Notes (Signed)
Patient came in today for a wound check.  The wound is clean, with no signs of infection noted. Follow up as needed.  

## 2015-11-21 ENCOUNTER — Other Ambulatory Visit: Payer: Self-pay | Admitting: Dentistry

## 2015-11-21 DIAGNOSIS — J449 Chronic obstructive pulmonary disease, unspecified: Secondary | ICD-10-CM

## 2015-12-17 ENCOUNTER — Other Ambulatory Visit: Payer: Self-pay | Admitting: Dentistry

## 2015-12-17 ENCOUNTER — Ambulatory Visit
Admission: RE | Admit: 2015-12-17 | Discharge: 2015-12-17 | Disposition: A | Discharge status: Home / Self Care | Payer: Disability Insurance | Source: Ambulatory Visit | Attending: Dentistry | Admitting: Dentistry

## 2015-12-17 ENCOUNTER — Ambulatory Visit (HOSPITAL_COMMUNITY): Payer: Disability Insurance

## 2015-12-17 DIAGNOSIS — M5136 Other intervertebral disc degeneration, lumbar region: Secondary | ICD-10-CM | POA: Insufficient documentation

## 2015-12-17 DIAGNOSIS — M1990 Unspecified osteoarthritis, unspecified site: Secondary | ICD-10-CM

## 2015-12-17 DIAGNOSIS — M5126 Other intervertebral disc displacement, lumbar region: Secondary | ICD-10-CM

## 2015-12-17 DIAGNOSIS — M797 Fibromyalgia: Secondary | ICD-10-CM | POA: Insufficient documentation

## 2015-12-17 DIAGNOSIS — J449 Chronic obstructive pulmonary disease, unspecified: Secondary | ICD-10-CM

## 2016-02-29 ENCOUNTER — Emergency Department: Payer: Medicare Other

## 2016-02-29 ENCOUNTER — Inpatient Hospital Stay
Admission: EM | Admit: 2016-02-29 | Discharge: 2016-03-01 | DRG: 190 | Disposition: A | Discharge status: Home / Self Care | Payer: Medicare Other | Attending: Internal Medicine | Admitting: Internal Medicine

## 2016-02-29 ENCOUNTER — Encounter: Payer: Self-pay | Admitting: Emergency Medicine

## 2016-02-29 DIAGNOSIS — I10 Essential (primary) hypertension: Secondary | ICD-10-CM | POA: Diagnosis present

## 2016-02-29 DIAGNOSIS — Z79891 Long term (current) use of opiate analgesic: Secondary | ICD-10-CM | POA: Diagnosis not present

## 2016-02-29 DIAGNOSIS — F329 Major depressive disorder, single episode, unspecified: Secondary | ICD-10-CM | POA: Diagnosis present

## 2016-02-29 DIAGNOSIS — J9601 Acute respiratory failure with hypoxia: Secondary | ICD-10-CM | POA: Diagnosis present

## 2016-02-29 DIAGNOSIS — F17210 Nicotine dependence, cigarettes, uncomplicated: Secondary | ICD-10-CM | POA: Diagnosis present

## 2016-02-29 DIAGNOSIS — R0600 Dyspnea, unspecified: Secondary | ICD-10-CM

## 2016-02-29 DIAGNOSIS — Z79899 Other long term (current) drug therapy: Secondary | ICD-10-CM | POA: Diagnosis not present

## 2016-02-29 DIAGNOSIS — Z888 Allergy status to other drugs, medicaments and biological substances status: Secondary | ICD-10-CM

## 2016-02-29 DIAGNOSIS — F419 Anxiety disorder, unspecified: Secondary | ICD-10-CM | POA: Diagnosis present

## 2016-02-29 DIAGNOSIS — Z809 Family history of malignant neoplasm, unspecified: Secondary | ICD-10-CM

## 2016-02-29 DIAGNOSIS — J441 Chronic obstructive pulmonary disease with (acute) exacerbation: Principal | ICD-10-CM

## 2016-02-29 DIAGNOSIS — N3281 Overactive bladder: Secondary | ICD-10-CM | POA: Diagnosis present

## 2016-02-29 DIAGNOSIS — E039 Hypothyroidism, unspecified: Secondary | ICD-10-CM | POA: Diagnosis present

## 2016-02-29 DIAGNOSIS — E559 Vitamin D deficiency, unspecified: Secondary | ICD-10-CM | POA: Diagnosis present

## 2016-02-29 LAB — COMPREHENSIVE METABOLIC PANEL
ALT: 11 U/L — ABNORMAL LOW (ref 14–54)
ANION GAP: 8 (ref 5–15)
AST: 16 U/L (ref 15–41)
Albumin: 3.3 g/dL — ABNORMAL LOW (ref 3.5–5.0)
Alkaline Phosphatase: 66 U/L (ref 38–126)
BUN: 8 mg/dL (ref 6–20)
CHLORIDE: 100 mmol/L — AB (ref 101–111)
CO2: 28 mmol/L (ref 22–32)
CREATININE: 0.87 mg/dL (ref 0.44–1.00)
Calcium: 8.6 mg/dL — ABNORMAL LOW (ref 8.9–10.3)
Glucose, Bld: 147 mg/dL — ABNORMAL HIGH (ref 65–99)
POTASSIUM: 3.9 mmol/L (ref 3.5–5.1)
SODIUM: 136 mmol/L (ref 135–145)
Total Bilirubin: 1 mg/dL (ref 0.3–1.2)
Total Protein: 6.7 g/dL (ref 6.5–8.1)

## 2016-02-29 LAB — TROPONIN I: Troponin I: <0.03 ng/mL (ref <0.03)

## 2016-02-29 LAB — CBC WITH DIFFERENTIAL/PLATELET
Basophils Absolute: 0.1 10*3/uL (ref 0–0.1)
Basophils Relative: 1 %
EOS ABS: 0.2 10*3/uL (ref 0–0.7)
EOS PCT: 2 %
HCT: 46 % (ref 35.0–47.0)
Hemoglobin: 16 g/dL (ref 12.0–16.0)
LYMPHS ABS: 2.7 10*3/uL (ref 1.0–3.6)
LYMPHS PCT: 23 %
MCH: 31.7 pg (ref 26.0–34.0)
MCHC: 34.7 g/dL (ref 32.0–36.0)
MCV: 91.5 fL (ref 80.0–100.0)
MONO ABS: 0.9 10*3/uL (ref 0.2–0.9)
Monocytes Relative: 8 %
Neutro Abs: 7.6 10*3/uL — ABNORMAL HIGH (ref 1.4–6.5)
Neutrophils Relative %: 66 %
PLATELETS: 253 10*3/uL (ref 150–440)
RBC: 5.03 MIL/uL (ref 3.80–5.20)
RDW: 13.7 % (ref 11.5–14.5)
WBC: 11.4 10*3/uL — AB (ref 3.6–11.0)

## 2016-02-29 LAB — BRAIN NATRIURETIC PEPTIDE: B Natriuretic Peptide: 45 pg/mL (ref 0.0–100.0)

## 2016-02-29 MED ORDER — ONDANSETRON HCL 4 MG/2ML IJ SOLN: 4.0000 mg | Freq: Four times a day (QID) | INTRAMUSCULAR | Status: DC | PRN

## 2016-02-29 MED ORDER — DARIFENACIN HYDROBROMIDE ER 7.5 MG PO TB24
7.5000 mg | ORAL_TABLET | Freq: Every day | ORAL | Status: DC
  Administered 2016-02-29 – 2016-03-01 (×2): 7.5 mg via ORAL
  Filled 2016-02-29 (×2): qty 1

## 2016-02-29 MED ORDER — TRAZODONE HCL 100 MG PO TABS: 300.0000 mg | ORAL_TABLET | Freq: Every day | ORAL | Status: DC

## 2016-02-29 MED ORDER — ACETAMINOPHEN 325 MG PO TABS: 650.0000 mg | ORAL_TABLET | Freq: Four times a day (QID) | ORAL | Status: DC | PRN

## 2016-02-29 MED ORDER — METHYLPREDNISOLONE SODIUM SUCC 125 MG IJ SOLR
60.0000 mg | INTRAMUSCULAR | Status: DC
  Administered 2016-03-01: 60 mg via INTRAVENOUS
  Filled 2016-02-29: qty 2

## 2016-02-29 MED ORDER — TIOTROPIUM BROMIDE MONOHYDRATE 18 MCG IN CAPS
18.0000 ug | ORAL_CAPSULE | Freq: Every day | RESPIRATORY_TRACT | Status: DC
  Administered 2016-03-01: 18 ug via RESPIRATORY_TRACT
  Filled 2016-02-29: qty 5

## 2016-02-29 MED ORDER — PROGESTERONE MICRONIZED 100 MG PO CAPS
100.0000 mg | ORAL_CAPSULE | Freq: Every day | ORAL | Status: DC
  Administered 2016-03-01: 100 mg via ORAL
  Filled 2016-02-29 (×2): qty 1

## 2016-02-29 MED ORDER — ASPIRIN EC 81 MG PO TBEC
81.0000 mg | DELAYED_RELEASE_TABLET | Freq: Every day | ORAL | Status: DC
  Administered 2016-03-01: 81 mg via ORAL
  Filled 2016-02-29: qty 1

## 2016-02-29 MED ORDER — LEVOTHYROXINE SODIUM 50 MCG PO TABS
50.0000 ug | ORAL_TABLET | Freq: Every morning | ORAL | Status: DC
  Administered 2016-03-01: 50 ug via ORAL
  Filled 2016-02-29: qty 1

## 2016-02-29 MED ORDER — METHYLPREDNISOLONE SODIUM SUCC 125 MG IJ SOLR
125.0000 mg | Freq: Once | INTRAMUSCULAR | Status: AC
  Administered 2016-02-29: 125 mg via INTRAVENOUS
  Filled 2016-02-29: qty 2

## 2016-02-29 MED ORDER — FENTANYL 75 MCG/HR TD PT72
75.0000 ug | MEDICATED_PATCH | TRANSDERMAL | Status: DC
  Administered 2016-02-29: 75 ug via TRANSDERMAL
  Filled 2016-02-29: qty 1

## 2016-02-29 MED ORDER — IPRATROPIUM-ALBUTEROL 0.5-2.5 (3) MG/3ML IN SOLN
3.0000 mL | Freq: Once | RESPIRATORY_TRACT | Status: AC
  Administered 2016-02-29: 3 mL via RESPIRATORY_TRACT
  Filled 2016-02-29: qty 3

## 2016-02-29 MED ORDER — ENOXAPARIN SODIUM 40 MG/0.4ML ~~LOC~~ SOLN
40.0000 mg | SUBCUTANEOUS | Status: DC
  Administered 2016-02-29: 40 mg via SUBCUTANEOUS
  Filled 2016-02-29: qty 0.4

## 2016-02-29 MED ORDER — ONDANSETRON HCL 4 MG PO TABS: 4.0000 mg | ORAL_TABLET | Freq: Four times a day (QID) | ORAL | Status: DC | PRN

## 2016-02-29 MED ORDER — AZITHROMYCIN 250 MG PO TABS
250.0000 mg | ORAL_TABLET | Freq: Every day | ORAL | Status: DC
  Administered 2016-02-29 – 2016-03-01 (×2): 250 mg via ORAL
  Filled 2016-02-29 (×2): qty 1

## 2016-02-29 MED ORDER — CLONAZEPAM 1 MG PO TABS
1.0000 mg | ORAL_TABLET | Freq: Two times a day (BID) | ORAL | Status: DC | PRN
  Administered 2016-02-29: 1 mg via ORAL
  Filled 2016-02-29: qty 1

## 2016-02-29 MED ORDER — FLUTICASONE FUROATE-VILANTEROL 100-25 MCG/INH IN AEPB
1.0000 | INHALATION_SPRAY | Freq: Every day | RESPIRATORY_TRACT | Status: DC
Start: 2016-02-29 — End: 2016-03-01
  Administered 2016-03-01: 1 via RESPIRATORY_TRACT
  Filled 2016-02-29: qty 28

## 2016-02-29 MED ORDER — GUAIFENESIN-CODEINE 100-10 MG/5ML PO SOLN
10.0000 mL | ORAL | Status: DC | PRN
  Administered 2016-03-01 (×2): 10 mL via ORAL
  Filled 2016-02-29 (×2): qty 10

## 2016-02-29 MED ORDER — OXYCODONE HCL 5 MG PO TABS: 5.0000 mg | ORAL_TABLET | ORAL | Status: DC | PRN

## 2016-02-29 MED ORDER — ACETAMINOPHEN 650 MG RE SUPP
650.0000 mg | Freq: Four times a day (QID) | RECTAL | Status: DC | PRN
Start: 2016-02-29 — End: 2016-03-01

## 2016-02-29 MED ORDER — IPRATROPIUM-ALBUTEROL 0.5-2.5 (3) MG/3ML IN SOLN
3.0000 mL | RESPIRATORY_TRACT | Status: DC
  Administered 2016-03-01 (×4): 3 mL via RESPIRATORY_TRACT
  Filled 2016-02-29 (×4): qty 3

## 2016-02-29 MED ORDER — GABAPENTIN 300 MG PO CAPS
300.0000 mg | ORAL_CAPSULE | Freq: Three times a day (TID) | ORAL | Status: DC
  Administered 2016-02-29 – 2016-03-01 (×2): 300 mg via ORAL
  Filled 2016-02-29 (×2): qty 1

## 2016-02-29 MED ORDER — FUROSEMIDE 40 MG PO TABS
40.0000 mg | ORAL_TABLET | Freq: Every day | ORAL | Status: DC
  Administered 2016-03-01: 40 mg via ORAL
  Filled 2016-02-29: qty 1

## 2016-02-29 NOTE — ED Triage Notes (Signed)
States SOB x 2 days, nausea and vomiting today.

## 2016-02-29 NOTE — ED Provider Notes (Signed)
[facility] Emergency Department Provider Note        Time seen: ----------------------------------------- 6:36 PM on 02/29/2016 -----------------------------------------    I have reviewed the triage vital signs and the nursing notes.   HISTORY  Chief Complaint Shortness of Breath    HPI Bonnie Gomez is a 50 y.o. female who presents to ER with shortness of breath for 2 days. She's had nausea and vomiting today.Patient states she was seen by her pulmonologist 12 days ago and started on Levaquin and prednisone. She finished the prednisone 4 days ago and Levaquin 2 days ago. She has not had any improvement in her shortness of breath. She still been coughing, today acutely she began throwing up. She denies any fevers, has been using inhalers without improving.   Past Medical History:  Diagnosis Date  . Allergy   . Anxiety   . Arthritis   . Asthma 2001  . Cancer ([place]) 2013   skin  . COPD (chronic obstructive pulmonary disease) ([place]) 2013  . Depression   . Emphysema lung ([place])   . 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 Hiatal hernia   . Hypertension   . Insomnia   . Overactive bladder   . Pneumonia    2013  . Thyroid disease   . Vitamin D deficiency     Patient Active Problem List   Diagnosis Date Noted  . Condyloma acuminatum in female 04/29/2015  . Perineal mass in female 04/11/2015    Past Surgical History:  Procedure Laterality Date  . ANGIOPLASTY  2008, 2012  . CHOLECYSTECTOMY  1985  . COLONOSCOPY  2013  . SKIN BIOPSY  2006  . TUBAL LIGATION  1992  . UPPER GI ENDOSCOPY  2013   [facility]    Allergies Varenicline and Duloxetine hcl  Social History Social History  Substance Use Topics  . Smoking status: Current Every Day Smoker    Packs/day: 0.50    Types: Cigarettes  . Smokeless tobacco: Not on file  . Alcohol use No    Review of Systems Constitutional: Negative for fever. Eyes: Negative for visual  changes. ENT: Positive for sinus congestion Cardiovascular: Negative for chest pain. Respiratory: Positive for shortness of breath, cough Gastrointestinal: Negative for abdominal pain, positive for vomiting Genitourinary: Negative for dysuria. Musculoskeletal: Negative for back pain. Skin: Negative for rash. Neurological: Negative for headaches, positive for weakness  10-point ROS otherwise negative.  ____________________________________________   PHYSICAL EXAM:  VITAL SIGNS: ED Triage Vitals  Enc Vitals Group     BP 02/29/16 1816 (!) 154/99     Pulse Rate 02/29/16 1816 90     Resp 02/29/16 1816 18     Temp 02/29/16 1816 98 F (36.7 C)     Temp Source 02/29/16 1816 Oral     SpO2 02/29/16 1816 90 %     Weight 02/29/16 1817 195 lb (88.5 kg)     Height 02/29/16 1817 5\' 3"  (1.6 m)     Head Circumference --      Peak Flow --      Pain Score 02/29/16 1817 7     Pain Loc --      Pain Edu? --      Excl. in [place]? --     Constitutional: Alert and oriented. Mild distress Eyes: Conjunctivae are normal. PERRL. Normal extraocular movements. ENT   Head: Normocephalic and atraumatic.   Nose: No congestion/rhinnorhea.   Mouth/Throat: Mucous membranes are moist.  Neck: No stridor. Cardiovascular: Normal rate, regular rhythm. No murmurs, rubs, or gallops. Respiratory: Wheezing with diminished breath sounds and rhonchi bilaterally. Gastrointestinal: Soft and nontender. Normal bowel sounds Musculoskeletal: Nontender with normal range of motion in all extremities. No lower extremity tenderness nor edema. Neurologic:  Normal speech and language. No gross focal neurologic deficits are appreciated.  Skin:  Skin is warm, dry and intact. No rash noted. Psychiatric: Mood and affect are normal. Speech and behavior are normal.  ____________________________________________  EKG: Interpreted by me.Sinus rhythm with a rate of 82 bpm, normal PR interval, normal QRS, normal QT interval.  Normal axis. No evidence of acute infarction.  ____________________________________________  ED COURSE:  Pertinent labs & imaging results that were available during my care of the patient were reviewed by me and considered in my medical decision making (see chart for details). Clinical Course  Patient is in mild distress from likely COPD exacerbation. She'll receive DuoNeb, steroids and reevaluation. We will assess with chest x-ray basic labs.  Procedures ____________________________________________   LABS (pertinent positives/negatives)  Labs Reviewed  CBC WITH DIFFERENTIAL/PLATELET - Abnormal; Notable for the following:       Result Value   WBC 11.4 (*)    Neutro Abs 7.6 (*)    All other components within normal limits  COMPREHENSIVE METABOLIC PANEL - Abnormal; Notable for the following:    Chloride 100 (*)    Glucose, Bld 147 (*)    Calcium 8.6 (*)    Albumin 3.3 (*)    ALT 11 (*)    All other components within normal limits  BRAIN NATRIURETIC PEPTIDE  TROPONIN I    RADIOLOGY Images were viewed by me  Chest x-ray Does not reveal any acute process ____________________________________________  FINAL ASSESSMENT AND PLAN  COPD exacerbation  Plan: Patient with labs and imaging as dictated above. Patient has not had significant improvement in her symptoms. Still diffuse bilateral wheezing with shortness of breath. She has failed outpatient management of her COPD, this is likely viral. I will discuss with the hospitalist for admission.   Earleen Newport, MD   Note: This dictation was prepared with Dragon dictation. Any transcriptional errors that result from this process are unintentional    Earleen Newport, MD 02/29/16 1946

## 2016-02-29 NOTE — H&P (Signed)
[facility] [facility] [place] NAME: Bonnie Gomez    MR#:  [id number]  DATE OF BIRTH:  November 17, 1965   DATE OF ADMISSION:  02/29/2016  PRIMARY CARE PHYSICIAN: Vista Mink, FNP  Pulmonary: Raul Del  REQUESTING/REFERRING PHYSICIAN: [place]  CHIEF COMPLAINT:   Chief Complaint  Patient presents with  . Shortness of Breath    HISTORY OF PRESENT ILLNESS:  Bonnie Gomez  is a 50 y.o. female with a known history of COPD not on oxygen presenting with shortness of breath and cough. Patient has had symptoms for 12 days in total including productive cough clear sputum generalized weakness fatigue malaise denies fevers or chills. Shortness of breath and wheezing. Saw her pulmonologist who started her on antibiotics and steroids she's completed a ten-day course of these without improvement of symptoms is presenting to Hospital further workup and evaluation. Noticing at home to be hypoxic with O2 saturations in the mid 80s on room air.  PAST MEDICAL HISTORY:   Past Medical History:  Diagnosis Date  . Allergy   . Anxiety   . Arthritis   . Asthma 2001  . Cancer ([place]) 2013   skin  . COPD (chronic obstructive pulmonary disease) ([place]) 2013  . Depression   . Emphysema lung ([place])   . 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 Hiatal hernia   . Hypertension   . Insomnia   . Overactive bladder   . Pneumonia    2013  . Thyroid disease   . Vitamin D deficiency     PAST SURGICAL HISTORY:   Past Surgical History:  Procedure Laterality Date  . ANGIOPLASTY  2008, 2012  . CHOLECYSTECTOMY  1985  . COLONOSCOPY  2013  . SKIN BIOPSY  2006  . TUBAL LIGATION  1992  . UPPER GI ENDOSCOPY  2013   [place]    SOCIAL HISTORY:   Social History  Substance Use Topics  . Smoking status: Current Every Day Smoker    Packs/day: 0.50    Types: Cigarettes  . Smokeless tobacco: Never Used  . Alcohol use No    FAMILY HISTORY:   Family History   Problem Relation Age of Onset  . Cancer Mother     lung  . Cancer Father     lung  . Cancer Maternal Grandmother     breast    DRUG ALLERGIES:   Allergies  Allergen Reactions  . Varenicline Other (See Comments)    Agitation  . Duloxetine Hcl Anxiety    Agitation    REVIEW OF SYSTEMS:  REVIEW OF SYSTEMS:  CONSTITUTIONAL: Denies fevers, chills,Positive fatigue, weakness.  EYES: Denies blurred vision, double vision, or eye pain.  EARS, NOSE, THROAT: Denies tinnitus, ear pain, hearing loss.  RESPIRATORY: Positive cough, shortness of breath, wheezing  CARDIOVASCULAR: Denies chest pain, palpitations, edema.  GASTROINTESTINAL: Denies nausea, vomiting, diarrhea, abdominal pain.  GENITOURINARY: Denies dysuria, hematuria.  ENDOCRINE: Denies nocturia or thyroid problems. HEMATOLOGIC AND LYMPHATIC: Denies easy bruising or bleeding.  SKIN: Denies rash or lesions.  MUSCULOSKELETAL: Denies pain in neck, back, shoulder, knees, hips, or further arthritic symptoms.  NEUROLOGIC: Denies paralysis, paresthesias.  PSYCHIATRIC: Denies anxiety or depressive symptoms. Otherwise full review of systems performed by me is negative.   MEDICATIONS AT HOME:   Prior to Admission medications   Medication Sig Start Date End Date Taking? Authorizing Provider  albuterol (PROAIR HFA) 108 (90 BASE) MCG/ACT inhaler Inhale 2 puffs into the lungs  every 6 (six) hours as needed for wheezing or shortness of breath.    Yes Historical Provider, MD  albuterol (PROVENTIL) (2.5 MG/3ML) 0.083% nebulizer solution INHALE 1 VIAL USING NEBULIZER EVERY SIX HOURS AS NEEDED 04/03/14  Yes Historical Provider, MD  aspirin EC 81 MG tablet Take 81 mg by mouth every morning.    Yes Historical Provider, MD  baclofen (LIORESAL) 10 MG tablet Take 10 mg by mouth 3 (three) times daily as needed for muscle spasms.   Yes Historical Provider, MD  butalbital-acetaminophen-caffeine (FIORICET) 50-325-40 MG tablet Take 1 tablet by mouth every  4 (four) hours as needed for headache.   Yes Historical Provider, MD  Cholecalciferol (VITAMIN D3) 5000 units CAPS Take 10,000 Units by mouth every morning.   Yes Historical Provider, MD  clonazePAM (KLONOPIN) 1 MG tablet Take 1 mg by mouth 3 (three) times daily as needed for anxiety.    Yes Historical Provider, MD  estradiol (ESTRACE) 0.5 MG tablet Take 0.5 mg by mouth every morning. For 21 days. Do not take for 1 week, then start again.   Yes Historical Provider, MD  fentaNYL (DURAGESIC - DOSED MCG/HR) 75 MCG/HR PLACE ONE PATCH ONTO THE SKIN EVERY 72 HOURS 03/23/15  Yes Historical Provider, MD  furosemide (LASIX) 40 MG tablet Take 40 mg by mouth daily as needed for fluid or edema.  04/07/15  Yes Historical Provider, MD  gabapentin (NEURONTIN) 300 MG capsule Take 300 mg by mouth every 6 (six) hours.  03/21/15  Yes Historical Provider, MD  ketoconazole (NIZORAL) 2 % cream APPLY A SMALL AMOUNT TOPICALLY TWO TIMES A DAY 04/08/15  Yes Historical Provider, MD  levofloxacin (LEVAQUIN) 500 MG tablet Take 500 mg by mouth daily. X 10 days. 02/20/16 02/29/16 Yes Historical Provider, MD  levothyroxine (SYNTHROID, LEVOTHROID) 50 MCG tablet Take 50 mcg by mouth every morning. 03/21/15  Yes Historical Provider, MD  losartan (COZAAR) 50 MG tablet Take 50 mg by mouth every morning.   Yes Historical Provider, MD  mometasone (ELOCON) 0.1 % cream Apply 1 application topically 2 (two) times daily.   Yes Historical Provider, MD  oxyCODONE-acetaminophen (PERCOCET) 7.5-325 MG per tablet TAKE 1 TABLET BY MOUTH EVERY 6 HOURS AND 1 TABLET AT BEDTIME AS NEEDED 03/21/15  Yes Historical Provider, MD  potassium chloride (KLOR-CON M10) 10 MEQ tablet Take 10 mEq by mouth every morning.   Yes Historical Provider, MD  promethazine (PHENERGAN) 25 MG tablet Take 25 mg by mouth 2 (two) times daily as needed for nausea.   Yes Historical Provider, MD  tiotropium (SPIRIVA) 18 MCG inhalation capsule Place 18 mcg into inhaler and inhale daily.    Yes  Historical Provider, MD  traZODone (DESYREL) 50 MG tablet Take 50 mg by mouth at bedtime.   Yes Historical Provider, MD  venlafaxine XR (EFFEXOR-XR) 37.5 MG 24 hr capsule Take 37.5 mg by mouth every evening.   Yes Historical Provider, MD  BD INTEGRA SYRINGE 25G X 1" 3 ML MISC USE WITH B12 01/09/15   Historical Provider, MD  fluconazole (DIFLUCAN) 150 MG tablet 1 ORAL EVERY DAY 04/08/15   Historical Provider, MD  Fluticasone Furoate-Vilanterol 100-25 MCG/INH AEPB Inhale into the lungs.    Historical Provider, MD  HYDROcodone-acetaminophen (NORCO) 5-325 MG tablet Take 1-2 tablets by mouth every 4 (four) hours as needed for moderate pain. 04/29/15   Robert Bellow, MD  progesterone (PROMETRIUM) 100 MG capsule Take 100 mg by mouth daily. 04/07/15   Historical Provider, MD  VITAL SIGNS:  Blood pressure 126/67, pulse 84, temperature 98 F (36.7 C), temperature source Oral, resp. rate 19, height 5\' 3"  (1.6 m), weight 88.5 kg (195 lb), SpO2 (!) 89 %.  PHYSICAL EXAMINATION:  VITAL SIGNS: Vitals:   02/29/16 1902 02/29/16 2000  BP:  126/67  Pulse: 77 84  Resp: 16 19  Temp:     GENERAL:49 y.o.female currently in Mild acute distress. Given respiratory status HEAD: Normocephalic, atraumatic.  EYES: Pupils equal, round, reactive to light. Extraocular muscles intact. No scleral icterus.  MOUTH: Moist mucosal membrane. Dentition poor. No abscess noted.  EAR, NOSE, THROAT: Clear without exudates. No external lesions.  NECK: Supple. No thyromegaly. No nodules. No JVD.  PULMONARY: Diminished breath sounds are all lung fields with scattered rhonchi next door wheezing tachypnea No use of accessory muscles, Good respiratory effort. good air entry bilaterally CHEST: Nontender to palpation.  CARDIOVASCULAR: S1 and S2. Regular rate and rhythm. No murmurs, rubs, or gallops. No edema. Pedal pulses 2+ bilaterally.  GASTROINTESTINAL: Soft, nontender, nondistended. No masses. Positive bowel sounds. No  hepatosplenomegaly.  MUSCULOSKELETAL: No swelling, clubbing, or edema. Range of motion full in all extremities.  NEUROLOGIC: Cranial nerves II through XII are intact. No gross focal neurological deficits. Sensation intact. Reflexes intact.  SKIN: No ulceration, lesions, rashes, or cyanosis. Skin warm and dry. Turgor intact.  PSYCHIATRIC: Mood, affect within normal limits. The patient is awake, alert and oriented x 3. Insight, judgment intact.    LABORATORY PANEL:   CBC  Recent Labs Lab 02/29/16 1841  WBC 11.4*  HGB 16.0  HCT 46.0  PLT 253   ------------------------------------------------------------------------------------------------------------------  Chemistries   Recent Labs Lab 02/29/16 1841  NA 136  K 3.9  CL 100*  CO2 28  GLUCOSE 147*  BUN 8  CREATININE 0.87  CALCIUM 8.6*  AST 16  ALT 11*  ALKPHOS 66  BILITOT 1.0   ------------------------------------------------------------------------------------------------------------------  Cardiac Enzymes  Recent Labs Lab 02/29/16 1842  TROPONINI <0.03   ------------------------------------------------------------------------------------------------------------------  RADIOLOGY:  Dg Chest 2 View  Result Date: 02/29/2016 CLINICAL DATA:  Shortness of breath for the past 2 days. Nausea and vomiting today. Smoker. EXAM: CHEST  2 VIEW COMPARISON:  07/15/2013 and chest CT dated 02/05/2014. FINDINGS: Normal sized heart. Clear lungs. Diffuse peribronchial thickening without significant change. Cholecystectomy clips. Minimal scoliosis and minimal thoracic spine degenerative changes. IMPRESSION: No acute abnormality.  Stable chronic bronchitic changes. Electronically Signed   By: Claudie Revering M.D.   On: 02/29/2016 18:37    EKG:   Orders placed or performed during the hospital encounter of 02/29/16  . ED EKG  . ED EKG    IMPRESSION AND PLAN:   50 year old Caucasian female history of COPD non-oxygen requiring  presenting with shortness of breath and cough  1.Chronic obstructive pulmonary disease exacerbation: Provide DuoNeb treatments q. 4 hours, Solu-Medrol 60 mg IV q. daily, add azithromycin. Continue with home medications. Patient follows Dr. Raul Del as outpatient  2. Hypothyroidism unspecified: Synthroid 3. Essential hypertension: Losartan 4. Anxiety/depression not otherwise specified: Venlafaxine     All the records are reviewed and case discussed with ED provider. Management plans discussed with the patient, family and they are in agreement.  CODE STATUS: Full  TOTAL TIME TAKING CARE OF THIS PATIENT: 33 minutes.    Nik Gorrell,  Karenann Cai.D on 02/29/2016 at 8:44 PM  Between 7am to 6pm - Pager - [phone]  After 6pm: House Pager: - [phone]  [facility]  Office  [phone]  CC: Primary care physician; Vista Mink, FNP

## 2016-03-01 MED ORDER — PREDNISONE 10 MG PO TABS: 10.0000 mg | ORAL_TABLET | Freq: Every day | ORAL | 0 refills | Status: DC

## 2016-03-01 MED ORDER — NICOTINE 21 MG/24HR TD PT24
21.0000 mg | MEDICATED_PATCH | Freq: Every day | TRANSDERMAL | Status: DC
  Administered 2016-03-01: 21 mg via TRANSDERMAL
  Filled 2016-03-01: qty 1

## 2016-03-01 MED ORDER — GUAIFENESIN-CODEINE 100-10 MG/5ML PO SOLN: 10.0000 mL | ORAL | 0 refills | Status: DC | PRN

## 2016-03-01 MED ORDER — AZITHROMYCIN 250 MG PO TABS: ORAL_TABLET | ORAL | 0 refills | Status: DC

## 2016-03-01 MED ORDER — NICOTINE 21 MG/24HR TD PT24: 21.0000 mg | MEDICATED_PATCH | Freq: Every day | TRANSDERMAL | 0 refills | Status: DC

## 2016-03-01 NOTE — Care Management Note (Signed)
Case Management Note  Patient Details  Name: Bonnie Gomez MRN: [id number] Date of Birth: 1965-12-25  Subjective/Objective:   [name] did not meet the O2 Progression measurements necessary to qualify for home oxygen at this time.                 Action/Plan:   Expected Discharge Date:                  Expected Discharge Plan:     In-House Referral:     Discharge planning Services     Post Acute Care Choice:    Choice offered to:     DME Arranged:    DME Agency:     HH Arranged:    HH Agency:     Status of Service:     If discussed at [employer] of Stay Meetings, dates discussed:    Additional Comments:  Jaren Kearn A, RN 03/01/2016, 11:58 AM

## 2016-03-01 NOTE — Progress Notes (Signed)
Received call from patient after discharge;she is upset the pharmacy won't fill Rx for cough meds. Yves Dill RN took call and said that she spoke to both Dr. Benjie Karvonen & pharmacy. Pharmacy refused to fill Guafinasin with codeine because patient also wears fentanyl patch.  Patient's nurse called and explained it to patient and she is alright with it.

## 2016-03-01 NOTE — Care Management Note (Signed)
Case Management Note  Patient Details  Name: LANAIYA GRIZZEL MRN: [id number] Date of Birth: Aug 10, 1965  Subjective/Objective:       MD order for new home oxygen. Discussed with [facility]. Oxygen will be ordered as soon as Ms Elder is qualified for oxygen.             Action/Plan:   Expected Discharge Date:                  Expected Discharge Plan:     In-House Referral:     Discharge planning Services     Post Acute Care Choice:    Choice offered to:     DME Arranged:    DME Agency:     HH Arranged:    HH Agency:     Status of Service:     If discussed at [employer] of Stay Meetings, dates discussed:    Additional Comments:  Jameyah Fennewald A, RN 03/01/2016, 10:50 AM

## 2016-03-01 NOTE — Progress Notes (Signed)
SATURATION QUALIFICATIONS: (This note is used to comply with regulatory documentation for home oxygen)  Patient Saturations on Room Air at Rest = 95%  Patient Saturations on Room Air while Ambulating = 92%  Patient Saturations on 0 Liters of oxygen while Ambulating = 0%  Please briefly explain why patient needs home oxygen:  Patient does not meet criteria.

## 2016-03-01 NOTE — Discharge Summary (Signed)
[facility] [place] NAME: Bonnie Gomez    MR#:  [id number]  DATE OF BIRTH:  [birth year]  DATE OF ADMISSION:  02/29/2016 ADMITTING PHYSICIAN: Lytle Butte, MD  DATE OF DISCHARGE: 03/01/2016  PRIMARY CARE PHYSICIAN: Vista Mink, FNP    ADMISSION DIAGNOSIS:  Dyspnea [R06.00] COPD exacerbation ([place]) [J44.1]  DISCHARGE DIAGNOSIS:  Active Problems:   COPD exacerbation ([place])   SECONDARY DIAGNOSIS:   Past Medical History:  Diagnosis Date  . Allergy   . Anxiety   . Arthritis   . Asthma 2001  . Cancer ([place]) 2013   skin  . COPD (chronic obstructive pulmonary disease) ([place]) 2013  . Depression   . Emphysema lung ([place])   . 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 Hiatal hernia   . Hypertension   . Insomnia   . Overactive bladder   . Pneumonia    2013  . Thyroid disease   . Vitamin D deficiency     HOSPITAL COURSE:   50 year old female with history of COPD and tobacco dependence who presents with acute COPD exacerbation.  1. acute hypoxic respiratory failure due to acute COPD exacerbation: Patient reports her symptoms have much improved. Patient was started on IV steroids, nebulizer and oxygen. She is also started on antibiotics. Chest x-ray did not show evidence of pneumonia. She will continue steroid taper, antibiotics and oxygen therapy.   2. Acute COPD exacerbation: Plan as outlined above.  3. Tobacco dependence: Patient is highly encouraged to stop smoking. Patient counseled 3 minutes. She will be discharged with nicotine patch.  4. Hypothyroidism: Patient will continue Synthroid.  5. Essential hypertension: Blood pressure is controlled on losartan.    DISCHARGE CONDITIONS AND DIET:  Stable for discharge  Heart  Healthy diet  CONSULTS OBTAINED:  Treatment Team:  Lytle Butte, MD  DRUG ALLERGIES:   Allergies  Allergen Reactions  . Varenicline Other (See Comments)    Agitation  .  Duloxetine Hcl Anxiety    Agitation    DISCHARGE MEDICATIONS:   Current Discharge Medication List    START taking these medications   Details  azithromycin (ZITHROMAX) 250 MG tablet Take for 4 days daily Qty: 4 each, Refills: 0    guaiFENesin-codeine 100-10 MG/5ML syrup Take 10 mLs by mouth every 4 (four) hours as needed for cough. Qty: 120 mL, Refills: 0    nicotine (NICODERM CQ - DOSED IN MG/24 HOURS) 21 mg/24hr patch Place 1 patch (21 mg total) onto the skin daily. Qty: 28 patch, Refills: 0    predniSONE (DELTASONE) 10 MG tablet Take 1 tablet (10 mg total) by mouth daily with breakfast. 60 mg PO (ORAL) x 2 days 50 mg PO (ORAL)  x 2 days 40 mg PO (ORAL)  x 2 days 30 mg PO  (ORAL)  x 2 days 20 mg PO  (ORAL) x 2 days 10 mg PO  (ORAL) x 2 days then stop Qty: 42 tablet, Refills: 0      CONTINUE these medications which have NOT CHANGED   Details  albuterol (PROAIR HFA) 108 (90 BASE) MCG/ACT inhaler Inhale 2 puffs into the lungs every 6 (six) hours as needed for wheezing or shortness of breath.     albuterol (PROVENTIL) (2.5 MG/3ML) 0.083% nebulizer solution INHALE 1 VIAL USING NEBULIZER EVERY SIX HOURS AS NEEDED    aspirin EC 81 MG tablet Take 81 mg by mouth every morning.  baclofen (LIORESAL) 10 MG tablet Take 10 mg by mouth 3 (three) times daily as needed for muscle spasms.    BD INTEGRA SYRINGE 25G X 1" 3 ML MISC USE WITH B12 Refills: 5    butalbital-acetaminophen-caffeine (FIORICET) 50-325-40 MG tablet Take 1 tablet by mouth every 4 (four) hours as needed for headache.    Cholecalciferol (VITAMIN D3) 5000 units CAPS Take 10,000 Units by mouth every morning.    clonazePAM (KLONOPIN) 1 MG tablet Take 1 mg by mouth 3 (three) times daily as needed for anxiety.     cyanocobalamin (,VITAMIN B-12,) 1000 MCG/ML injection Inject 1,000 mcg into the muscle once a week. Use on Saturdays.    DALIRESP 500 MCG TABS tablet Take 500 mcg by mouth every evening.  Refills: 2     estradiol (ESTRACE) 0.5 MG tablet Take 0.5 mg by mouth every morning. For 21 days. Do not take for 1 week, then start again.    fentaNYL (DURAGESIC - DOSED MCG/HR) 75 MCG/HR PLACE ONE PATCH ONTO THE SKIN EVERY 72 HOURS Refills: 0    Fluticasone Furoate-Vilanterol 100-25 MCG/INH AEPB Inhale 1 puff into the lungs daily.     furosemide (LASIX) 40 MG tablet Take 40 mg by mouth daily as needed for fluid or edema.  Refills: 5    gabapentin (NEURONTIN) 300 MG capsule Take 300 mg by mouth every 6 (six) hours.  Refills: 4    hydrochlorothiazide (HYDRODIURIL) 25 MG tablet Take 25 mg by mouth daily. Refills: 2    ketoconazole (NIZORAL) 2 % cream APPLY A SMALL AMOUNT TOPICALLY TWO TIMES A DAY Refills: 2    levothyroxine (SYNTHROID, LEVOTHROID) 50 MCG tablet Take 50 mcg by mouth every morning. Refills: 2    lidocaine (XYLOCAINE) 5 % ointment Apply 2-3 g topically See admin instructions. Apply 2 to 3 grams topically to affected area 3 to 4 times per day. Refills: 5    losartan (COZAAR) 50 MG tablet Take 50 mg by mouth every morning.    mometasone (ELOCON) 0.1 % cream Apply 1 application topically 2 (two) times daily.    potassium chloride (KLOR-CON M10) 10 MEQ tablet Take 10 mEq by mouth every morning.    progesterone (PROMETRIUM) 100 MG capsule Take 100 mg by mouth See admin instructions. Take 100 mg by mouth for the last 7 days of the month after 21 day cycle of Estradiol. Refills: 5    promethazine (PHENERGAN) 25 MG tablet Take 25 mg by mouth 2 (two) times daily as needed for nausea.    tiotropium (SPIRIVA) 18 MCG inhalation capsule Place 18 mcg into inhaler and inhale daily.     traMADol (ULTRAM) 50 MG tablet Take 50 mg by mouth See admin instructions. Take 50 mg by mouth every 6 to 8 hours as needed for pain. Refills: 1    traZODone (DESYREL) 50 MG tablet Take 50 mg by mouth at bedtime.    valACYclovir (VALTREX) 500 MG tablet Take 500 mg by mouth daily. Refills: 4     venlafaxine XR (EFFEXOR-XR) 37.5 MG 24 hr capsule Take 37.5 mg by mouth every evening.    VESICARE 10 MG tablet Take 10 mg by mouth daily. Refills: 1    fluconazole (DIFLUCAN) 150 MG tablet 1 ORAL EVERY DAY Refills: 0      STOP taking these medications     levofloxacin (LEVAQUIN) 500 MG tablet      oxyCODONE-acetaminophen (PERCOCET) 7.5-325 MG per tablet      HYDROcodone-acetaminophen (NORCO) 5-325 MG  tablet               Today   CHIEF COMPLAINT:   Patient reports she is feels 100% better than yesterday. She denies shortness of breath. She continues to have cough. She denies fever or chills.   VITAL SIGNS:  Blood pressure (!) 141/71, pulse 78, temperature 98 F (36.7 C), temperature source Oral, resp. rate 18, height 5\' 3"  (1.6 m), weight 86.5 kg (190 lb 9.6 oz), SpO2 94 %.   REVIEW OF SYSTEMS:  Review of Systems  Constitutional: Negative.  Negative for chills, fever and malaise/fatigue.  HENT: Negative.  Negative for ear discharge, ear pain, hearing loss, nosebleeds and sore throat.   Eyes: Negative.  Negative for blurred vision and pain.  Respiratory: Positive for cough. Negative for hemoptysis, shortness of breath and wheezing.   Cardiovascular: Negative.  Negative for chest pain, palpitations and leg swelling.  Gastrointestinal: Negative.  Negative for abdominal pain, blood in stool, diarrhea, nausea and vomiting.  Genitourinary: Negative.  Negative for dysuria.  Musculoskeletal: Negative.  Negative for back pain.  Skin: Negative.   Neurological: Negative for dizziness, tremors, speech change, focal weakness, seizures and headaches.  Endo/Heme/Allergies: Negative.  Does not bruise/bleed easily.  Psychiatric/Behavioral: Negative.  Negative for depression, hallucinations and suicidal ideas.     PHYSICAL EXAMINATION:  GENERAL:  50 y.o.-year-old patient lying in the bed with no acute distress.  NECK:  Supple, no jugular venous distention. No thyroid  enlargement, no tenderness.  LUNGS: Normal breath sounds bilaterally, no wheezing, rales,rhonchi  No use of accessory muscles of respiration.  CARDIOVASCULAR: S1, S2 normal. No murmurs, rubs, or gallops.  ABDOMEN: Soft, non-tender, non-distended. Bowel sounds present. No organomegaly or mass.  EXTREMITIES: No pedal edema, cyanosis, or clubbing.  PSYCHIATRIC: The patient is alert and oriented x 3.  SKIN: No obvious rash, lesion, or ulcer.   DATA REVIEW:   CBC  Recent Labs Lab 02/29/16 1841  WBC 11.4*  HGB 16.0  HCT 46.0  PLT 253    Chemistries   Recent Labs Lab 02/29/16 1841  NA 136  K 3.9  CL 100*  CO2 28  GLUCOSE 147*  BUN 8  CREATININE 0.87  CALCIUM 8.6*  AST 16  ALT 11*  ALKPHOS 66  BILITOT 1.0    Cardiac Enzymes  Recent Labs Lab 02/29/16 1842  TROPONINI <0.03    Microbiology Results  @[email or web address] @  RADIOLOGY:  Dg Chest 2 View  Result Date: 02/29/2016 CLINICAL DATA:  Shortness of breath for the past 2 days. Nausea and vomiting today. Smoker. EXAM: CHEST  2 VIEW COMPARISON:  07/15/2013 and chest CT dated 02/05/2014. FINDINGS: Normal sized heart. Clear lungs. Diffuse peribronchial thickening without significant change. Cholecystectomy clips. Minimal scoliosis and minimal thoracic spine degenerative changes. IMPRESSION: No acute abnormality.  Stable chronic bronchitic changes. Electronically Signed   By: Claudie Revering M.D.   On: 02/29/2016 18:37      Management plans discussed with the patient and she is in agreement. Stable for discharge home  Patient should follow up with pcp  CODE STATUS:     Code Status Orders        Start     Ordered   02/29/16 1959  Full code  Continuous     02/29/16 1959    Code Status History    Date Active Date Inactive Code Status Order ID Comments User Context   This patient has a current code status but no historical code status.  TOTAL TIME TAKING CARE OF THIS PATIENT: 36 minutes.    Note: This  dictation was prepared with Dragon dictation along with smaller phrase technology. Any transcriptional errors that result from this process are unintentional.  Rylyn Zawistowski M.D on 03/01/2016 at 10:43 AM  Between 7am to 6pm - Pager - [phone] After 6pm go to [email or web address] - password [email or web address] [facility]  Office  [phone]  CC: Primary care physician; Vista Mink, FNP

## 2016-03-01 NOTE — Progress Notes (Signed)
Patient is being discharged to home this afternoon. D/C and Rx instructions given and acknowledged. IV removed. Patient is waiting for family member to pick her up.

## 2016-10-01 ENCOUNTER — Encounter: Payer: Self-pay | Admitting: Oncology

## 2016-10-01 ENCOUNTER — Encounter (INDEPENDENT_AMBULATORY_CARE_PROVIDER_SITE_OTHER): Payer: Self-pay

## 2016-10-01 ENCOUNTER — Inpatient Hospital Stay: Payer: Medicare Other

## 2016-10-01 ENCOUNTER — Inpatient Hospital Stay: Payer: Medicare Other | Attending: Oncology | Admitting: Oncology

## 2016-10-01 VITALS — BP 110/73 | HR 90 | Temp 98.1°F | Resp 18 | Ht 64.17 in | Wt 206.0 lb

## 2016-10-01 DIAGNOSIS — Z7982 Long term (current) use of aspirin: Secondary | ICD-10-CM | POA: Insufficient documentation

## 2016-10-01 DIAGNOSIS — K449 Diaphragmatic hernia without obstruction or gangrene: Secondary | ICD-10-CM | POA: Insufficient documentation

## 2016-10-01 DIAGNOSIS — D751 Secondary polycythemia: Secondary | ICD-10-CM | POA: Diagnosis not present

## 2016-10-01 DIAGNOSIS — A630 Anogenital (venereal) warts: Secondary | ICD-10-CM

## 2016-10-01 DIAGNOSIS — J45909 Unspecified asthma, uncomplicated: Secondary | ICD-10-CM | POA: Diagnosis not present

## 2016-10-01 DIAGNOSIS — E559 Vitamin D deficiency, unspecified: Secondary | ICD-10-CM | POA: Diagnosis not present

## 2016-10-01 DIAGNOSIS — F419 Anxiety disorder, unspecified: Secondary | ICD-10-CM | POA: Diagnosis not present

## 2016-10-01 DIAGNOSIS — Z803 Family history of malignant neoplasm of breast: Secondary | ICD-10-CM | POA: Diagnosis not present

## 2016-10-01 DIAGNOSIS — E669 Obesity, unspecified: Secondary | ICD-10-CM | POA: Insufficient documentation

## 2016-10-01 DIAGNOSIS — Z8701 Personal history of pneumonia (recurrent): Secondary | ICD-10-CM | POA: Insufficient documentation

## 2016-10-01 DIAGNOSIS — J449 Chronic obstructive pulmonary disease, unspecified: Secondary | ICD-10-CM | POA: Diagnosis not present

## 2016-10-01 DIAGNOSIS — M25561 Pain in right knee: Secondary | ICD-10-CM | POA: Diagnosis not present

## 2016-10-01 DIAGNOSIS — N3281 Overactive bladder: Secondary | ICD-10-CM

## 2016-10-01 DIAGNOSIS — D72829 Elevated white blood cell count, unspecified: Secondary | ICD-10-CM | POA: Diagnosis not present

## 2016-10-01 DIAGNOSIS — Z801 Family history of malignant neoplasm of trachea, bronchus and lung: Secondary | ICD-10-CM | POA: Diagnosis not present

## 2016-10-01 DIAGNOSIS — I10 Essential (primary) hypertension: Secondary | ICD-10-CM | POA: Insufficient documentation

## 2016-10-01 DIAGNOSIS — J439 Emphysema, unspecified: Secondary | ICD-10-CM | POA: Diagnosis not present

## 2016-10-01 DIAGNOSIS — E039 Hypothyroidism, unspecified: Secondary | ICD-10-CM | POA: Insufficient documentation

## 2016-10-01 DIAGNOSIS — M129 Arthropathy, unspecified: Secondary | ICD-10-CM

## 2016-10-01 DIAGNOSIS — G4700 Insomnia, unspecified: Secondary | ICD-10-CM | POA: Insufficient documentation

## 2016-10-01 DIAGNOSIS — Z79899 Other long term (current) drug therapy: Secondary | ICD-10-CM

## 2016-10-01 DIAGNOSIS — F329 Major depressive disorder, single episode, unspecified: Secondary | ICD-10-CM | POA: Diagnosis not present

## 2016-10-01 DIAGNOSIS — F17210 Nicotine dependence, cigarettes, uncomplicated: Secondary | ICD-10-CM | POA: Diagnosis not present

## 2016-10-01 DIAGNOSIS — D72825 Bandemia: Secondary | ICD-10-CM

## 2016-10-01 LAB — CBC
HEMATOCRIT: 42.3 % (ref 35.0–47.0)
Hemoglobin: 14.9 g/dL (ref 12.0–16.0)
MCH: 31.7 pg (ref 26.0–34.0)
MCHC: 35.1 g/dL (ref 32.0–36.0)
MCV: 90.2 fL (ref 80.0–100.0)
PLATELETS: 256 10*3/uL (ref 150–440)
RBC: 4.69 MIL/uL (ref 3.80–5.20)
RDW: 13.9 % (ref 11.5–14.5)
WBC: 8.7 10*3/uL (ref 3.6–11.0)

## 2016-10-01 LAB — URINALYSIS, COMPLETE (UACMP) WITH MICROSCOPIC
BACTERIA UA: NONE SEEN
Bilirubin Urine: NEGATIVE
GLUCOSE, UA: NEGATIVE mg/dL
Hgb urine dipstick: NEGATIVE
Ketones, ur: NEGATIVE mg/dL
Leukocytes, UA: NEGATIVE
Nitrite: NEGATIVE
PROTEIN: NEGATIVE mg/dL
Specific Gravity, Urine: 1.008 (ref 1.005–1.030)
pH: 5 (ref 5.0–8.0)

## 2016-10-01 LAB — DIFFERENTIAL
BASOS ABS: 0 10*3/uL (ref 0–0.1)
BASOS PCT: 1 %
EOS ABS: 0.1 10*3/uL (ref 0–0.7)
Eosinophils Relative: 2 %
Lymphocytes Relative: 29 %
Lymphs Abs: 2.5 10*3/uL (ref 1.0–3.6)
MONOS PCT: 8 %
Monocytes Absolute: 0.7 10*3/uL (ref 0.2–0.9)
Neutro Abs: 5.3 10*3/uL (ref 1.4–6.5)
Neutrophils Relative %: 60 %

## 2016-10-01 LAB — SEDIMENTATION RATE: Sed Rate: 12 mm/hr (ref 0–30)

## 2016-10-01 LAB — C-REACTIVE PROTEIN: CRP: 1 mg/dL — ABNORMAL HIGH (ref <1.0)

## 2016-10-01 NOTE — Progress Notes (Signed)
New pt. Referred by [facility].

## 2016-10-01 NOTE — Progress Notes (Signed)
Hematology/Oncology Consult note [facility] Telephone:([phone] [phone]  Patient Care Team: Vista Mink, [place] as PCP - General (Family Medicine) Robert Bellow, MD (General Surgery) Vista Mink, [place] (Family Medicine)   Name of the patient: Bonnie Gomez  [id number]  July 17, 1966    Reason for referral- leucocytosis   Referring physician- Threasa Alpha FNP  Date of visit: 10/01/16   History of presenting illness- patient is a 51 year old female who has been referred to [place] for leukocytosis. Blood work from 08/28/2016 was as follows: TSH was normal at 1.62. T3 and T4 within normal limits. BMP was within normal limits. CBC showed white count of 14 point with absolute neutrophilia, H&H of 16.1/47.4 and a platelet count of 265. Her past medical history is significant for hypothyroidism, anxiety disorder, COPD and hypertension as well as morbid obesity and other medical problems. Looking back at her prior CBCs she had an elevated white count of 15 and 18 on 2 occasions in 2013 again with absolute neutrophilia. White count in August 2017 was 11.4  Patient has been smoking 1 pack of cigarettes per day for the last 30 years. She reports problems with right knee pain. She is also had gallbladder surgery in 1980s and since then she has been having problems with dumping syndrome where she feels that she has to go to the restroom almost immediately after eating her food.   ECOG PS- 1  Pain scale- 7- knee pain   Review of systems- Review of Systems  Constitutional: Negative for chills, fever, malaise/fatigue and weight loss.  HENT: Negative for congestion, ear discharge and nosebleeds.   Eyes: Negative for blurred vision.  Respiratory: Negative for cough, hemoptysis, sputum production, shortness of breath and wheezing.   Cardiovascular: Negative for chest pain, palpitations, orthopnea and claudication.  Gastrointestinal: Negative  for abdominal pain, blood in stool, constipation, diarrhea, heartburn, melena, nausea and vomiting.  Genitourinary: Negative for dysuria, flank pain, frequency, hematuria and urgency.  Musculoskeletal: Negative for back pain, joint pain and myalgias.  Skin: Negative for rash.  Neurological: Negative for dizziness, tingling, focal weakness, seizures, weakness and headaches.  Endo/Heme/Allergies: Does not bruise/bleed easily.  Psychiatric/Behavioral: Negative for depression and suicidal ideas. The patient does not have insomnia.     Allergies  Allergen Reactions  . Varenicline Other (See Comments)    Agitation  . Duloxetine Hcl Anxiety    Agitation    Patient Active Problem List   Diagnosis Date Noted  . COPD exacerbation ([place]) 02/29/2016  . Condyloma acuminatum in female 04/29/2015  . Perineal mass in female 04/11/2015     Past Medical History:  Diagnosis Date  . Allergy   . Anxiety   . Arthritis   . Asthma 2001  . Cancer ([place]) 2013   skin  . COPD (chronic obstructive pulmonary disease) ([place]) 2013  . Depression   . Emphysema lung ([place])   . 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 Hiatal hernia   . Hypertension   . Insomnia   . Overactive bladder   . Pneumonia    2013  . Thyroid disease   . Vitamin D deficiency      Past Surgical History:  Procedure Laterality Date  . ANGIOPLASTY  2008, 2012  . CHOLECYSTECTOMY  1985  . COLONOSCOPY  2013  . SKIN BIOPSY  2006  . TUBAL LIGATION  1992  . UPPER GI ENDOSCOPY  2013   [place]  Social History   Social History  . Marital status: Single    Spouse name: N/A  . Number of children: N/A  . Years of education: N/A   Occupational History  . Not on file.   Social History Main Topics  . Smoking status: Current Every Day Smoker    Packs/day: 0.50    Types: Cigarettes  . Smokeless tobacco: Never Used  . Alcohol use No  . Drug use: No  . Sexual activity: Not on file   Other Topics Concern  . Not on  file   Social History Narrative  . No narrative on file     Family History  Problem Relation Age of Onset  . Cancer Mother     lung  . Cancer Father     lung  . Cancer Maternal Grandmother     breast     Current Outpatient Prescriptions:  .  albuterol (PROAIR HFA) 108 (90 BASE) MCG/ACT inhaler, Inhale 2 puffs into the lungs every 6 (six) hours as needed for wheezing or shortness of breath. , Disp: , Rfl:  .  albuterol (PROVENTIL) (2.5 MG/3ML) 0.083% nebulizer solution, INHALE 1 VIAL USING NEBULIZER EVERY SIX HOURS AS NEEDED, Disp: , Rfl:  .  aspirin EC 81 MG tablet, Take 81 mg by mouth every morning. , Disp: , Rfl:  .  baclofen (LIORESAL) 10 MG tablet, Take 10 mg by mouth 3 (three) times daily as needed for muscle spasms., Disp: , Rfl:  .  BD INTEGRA SYRINGE 25G X 1" 3 ML MISC, USE WITH B12, Disp: , Rfl: 5 .  butalbital-acetaminophen-caffeine (FIORICET) 50-325-40 MG tablet, Take 1 tablet by mouth every 4 (four) hours as needed for headache., Disp: , Rfl:  .  Cholecalciferol (VITAMIN D3) 5000 units CAPS, Take 10,000 Units by mouth every morning., Disp: , Rfl:  .  clonazePAM (KLONOPIN) 1 MG tablet, Take 1 mg by mouth 3 (three) times daily as needed for anxiety. , Disp: , Rfl:  .  cyanocobalamin (,VITAMIN B-12,) 1000 MCG/ML injection, Inject 1,000 mcg into the muscle once a week. Use on Saturdays., Disp: , Rfl:  .  DALIRESP 500 MCG TABS tablet, Take 500 mcg by mouth every evening. , Disp: , Rfl: 2 .  estradiol (ESTRACE) 0.5 MG tablet, Take 0.5 mg by mouth every morning. For 21 days. Do not take for 1 week, then start again., Disp: , Rfl:  .  fentaNYL (DURAGESIC - DOSED MCG/HR) 75 MCG/HR, PLACE ONE PATCH ONTO THE SKIN EVERY 72 HOURS, Disp: , Rfl: 0 .  fluconazole (DIFLUCAN) 150 MG tablet, 1 ORAL EVERY DAY, Disp: , Rfl: 0 .  Fluticasone Furoate-Vilanterol 100-25 MCG/INH AEPB, Inhale 1 puff into the lungs daily. , Disp: , Rfl:  .  furosemide (LASIX) 40 MG tablet, Take 40 mg by mouth daily  as needed for fluid or edema. , Disp: , Rfl: 5 .  gabapentin (NEURONTIN) 300 MG capsule, Take 300 mg by mouth every 6 (six) hours. , Disp: , Rfl: 4 .  guaiFENesin-codeine 100-10 MG/5ML syrup, Take 10 mLs by mouth every 4 (four) hours as needed for cough., Disp: 120 mL, Rfl: 0 .  hydrochlorothiazide (HYDRODIURIL) 25 MG tablet, Take 25 mg by mouth daily., Disp: , Rfl: 2 .  ketoconazole (NIZORAL) 2 % cream, APPLY A SMALL AMOUNT TOPICALLY TWO TIMES A DAY, Disp: , Rfl: 2 .  levothyroxine (SYNTHROID, LEVOTHROID) 50 MCG tablet, Take 50 mcg by mouth every morning., Disp: , Rfl: 2 .  lidocaine (XYLOCAINE)  5 % ointment, Apply 2-3 g topically See admin instructions. Apply 2 to 3 grams topically to affected area 3 to 4 times per day., Disp: , Rfl: 5 .  losartan (COZAAR) 50 MG tablet, Take 50 mg by mouth every morning., Disp: , Rfl:  .  mometasone (ELOCON) 0.1 % cream, Apply 1 application topically 2 (two) times daily., Disp: , Rfl:  .  nicotine (NICODERM CQ - DOSED IN MG/24 HOURS) 21 mg/24hr patch, Place 1 patch (21 mg total) onto the skin daily., Disp: 28 patch, Rfl: 0 .  potassium chloride (KLOR-CON M10) 10 MEQ tablet, Take 10 mEq by mouth every morning., Disp: , Rfl:  .  predniSONE (DELTASONE) 10 MG tablet, Take 1 tablet (10 mg total) by mouth daily with breakfast. 60 mg PO (ORAL) x 2 days 50 mg PO (ORAL)  x 2 days 40 mg PO (ORAL)  x 2 days 30 mg PO  (ORAL)  x 2 days 20 mg PO  (ORAL) x 2 days 10 mg PO  (ORAL) x 2 days then stop, Disp: 42 tablet, Rfl: 0 .  progesterone (PROMETRIUM) 100 MG capsule, Take 100 mg by mouth See admin instructions. Take 100 mg by mouth for the last 7 days of the month after 21 day cycle of Estradiol., Disp: , Rfl: 5 .  promethazine (PHENERGAN) 25 MG tablet, Take 25 mg by mouth 2 (two) times daily as needed for nausea., Disp: , Rfl:  .  tiotropium (SPIRIVA) 18 MCG inhalation capsule, Place 18 mcg into inhaler and inhale daily. , Disp: , Rfl:  .  traMADol (ULTRAM) 50 MG tablet, Take 50  mg by mouth See admin instructions. Take 50 mg by mouth every 6 to 8 hours as needed for pain., Disp: , Rfl: 1 .  traZODone (DESYREL) 50 MG tablet, Take 50 mg by mouth at bedtime., Disp: , Rfl:  .  valACYclovir (VALTREX) 500 MG tablet, Take 500 mg by mouth daily., Disp: , Rfl: 4 .  venlafaxine XR (EFFEXOR-XR) 37.5 MG 24 hr capsule, Take 37.5 mg by mouth every evening., Disp: , Rfl:  .  VESICARE 10 MG tablet, Take 10 mg by mouth daily., Disp: , Rfl: 1   Physical exam:  Vitals:   10/01/16 1444  BP: 110/73  Pulse: 90  Resp: 18  Temp: 98.1 F (36.7 C)  TempSrc: Tympanic  Weight: 206 lb 0.3 oz (93.5 kg)  Height: 5' 4.17" (1.63 m)   Physical Exam  Constitutional: She is oriented to person, place, and time and well-developed, well-nourished, and in no distress.  HENT:  Head: Normocephalic and atraumatic.  Eyes: EOM are normal. Pupils are equal, round, and reactive to light.  Neck: Normal range of motion.  Cardiovascular: Normal rate, regular rhythm and normal heart sounds.   Pulmonary/Chest: Effort normal and breath sounds normal.  Abdominal: Soft. Bowel sounds are normal.  Neurological: She is alert and oriented to person, place, and time.  Skin: Skin is warm and dry.       CMP Latest Ref Rng & Units 02/29/2016  Glucose 65 - 99 mg/dL 147(H)  BUN 6 - 20 mg/dL 8  Creatinine 0.44 - 1.00 mg/dL 0.87  Sodium 135 - 145 mmol/L 136  Potassium 3.5 - 5.1 mmol/L 3.9  Chloride 101 - 111 mmol/L 100(L)  CO2 22 - 32 mmol/L 28  Calcium 8.9 - 10.3 mg/dL 8.6(L)  Total Protein 6.5 - 8.1 g/dL 6.7  Total Bilirubin 0.3 - 1.2 mg/dL 1.0  Alkaline Phos 38 -  126 U/L 66  AST 15 - 41 U/L 16  ALT 14 - 54 U/L 11(L)   CBC Latest Ref Rng & Units 02/29/2016  WBC 3.6 - 11.0 K/uL 11.4(H)  Hemoglobin 12.0 - 16.0 g/dL 16.0  Hematocrit 35.0 - 47.0 % 46.0  Platelets 150 - 440 K/uL 253    No images are attached to the encounter.  No results found.  Assessment and plan- Patient is a 51 y.o. female who has  been referred to [place] for leukocytosis/neutrophilia  Given that patient has predominantly neutrophilia this is likely reactive from her underlying medical problems. Today I will get a CBC with manual differential, ESR, CRP, pathology review of her peripheral smear and peripheral  flow cytometry. She also was noted to have mild polycythemia with a hemoglobin of 16.1. I will check a jak 2 mutation, UA and EPO level. Prior x-ray from August 2017 showed chronic bronchitis changes. I will see the patient back in about 3 weeks' time to discuss the results of her blood work and further workup if need be.    Thank you for this kind referral and the opportunity to participate in the care of this patient   Visit Diagnosis 1. Bandemia   2. Polycythemia, secondary     Dr. Randa Evens, MD, MPH [facility] at [facility] Pager- [phone] 10/01/2016  3:25 PM

## 2016-10-02 LAB — ERYTHROPOIETIN: Erythropoietin: 27.6 m[IU]/mL — ABNORMAL HIGH (ref 2.6–18.5)

## 2016-10-02 LAB — PATHOLOGIST SMEAR REVIEW

## 2016-10-05 LAB — JAK2 GENOTYPR

## 2016-10-08 LAB — COMP PANEL: LEUKEMIA/LYMPHOMA

## 2016-10-22 ENCOUNTER — Inpatient Hospital Stay: Payer: Medicare Other | Attending: Oncology | Admitting: Oncology

## 2016-10-22 ENCOUNTER — Encounter: Payer: Self-pay | Admitting: Oncology

## 2016-10-22 VITALS — BP 139/89 | HR 78 | Temp 96.4°F | Resp 20 | Wt 197.3 lb

## 2016-10-22 DIAGNOSIS — G4700 Insomnia, unspecified: Secondary | ICD-10-CM | POA: Insufficient documentation

## 2016-10-22 DIAGNOSIS — I10 Essential (primary) hypertension: Secondary | ICD-10-CM | POA: Diagnosis not present

## 2016-10-22 DIAGNOSIS — Z79899 Other long term (current) drug therapy: Secondary | ICD-10-CM | POA: Diagnosis not present

## 2016-10-22 DIAGNOSIS — Z803 Family history of malignant neoplasm of breast: Secondary | ICD-10-CM | POA: Diagnosis not present

## 2016-10-22 DIAGNOSIS — D72829 Elevated white blood cell count, unspecified: Secondary | ICD-10-CM | POA: Insufficient documentation

## 2016-10-22 DIAGNOSIS — J449 Chronic obstructive pulmonary disease, unspecified: Secondary | ICD-10-CM | POA: Insufficient documentation

## 2016-10-22 DIAGNOSIS — F17200 Nicotine dependence, unspecified, uncomplicated: Secondary | ICD-10-CM

## 2016-10-22 DIAGNOSIS — D72825 Bandemia: Secondary | ICD-10-CM

## 2016-10-22 DIAGNOSIS — K449 Diaphragmatic hernia without obstruction or gangrene: Secondary | ICD-10-CM

## 2016-10-22 DIAGNOSIS — F329 Major depressive disorder, single episode, unspecified: Secondary | ICD-10-CM | POA: Insufficient documentation

## 2016-10-22 DIAGNOSIS — F419 Anxiety disorder, unspecified: Secondary | ICD-10-CM | POA: Insufficient documentation

## 2016-10-22 DIAGNOSIS — E559 Vitamin D deficiency, unspecified: Secondary | ICD-10-CM | POA: Diagnosis not present

## 2016-10-22 DIAGNOSIS — K911 Postgastric surgery syndromes: Secondary | ICD-10-CM | POA: Diagnosis not present

## 2016-10-22 DIAGNOSIS — Z7982 Long term (current) use of aspirin: Secondary | ICD-10-CM | POA: Diagnosis not present

## 2016-10-22 DIAGNOSIS — E039 Hypothyroidism, unspecified: Secondary | ICD-10-CM | POA: Insufficient documentation

## 2016-10-22 DIAGNOSIS — M25561 Pain in right knee: Secondary | ICD-10-CM | POA: Insufficient documentation

## 2016-10-22 DIAGNOSIS — F17210 Nicotine dependence, cigarettes, uncomplicated: Secondary | ICD-10-CM | POA: Diagnosis not present

## 2016-10-22 DIAGNOSIS — Z801 Family history of malignant neoplasm of trachea, bronchus and lung: Secondary | ICD-10-CM | POA: Diagnosis not present

## 2016-10-22 DIAGNOSIS — N3281 Overactive bladder: Secondary | ICD-10-CM | POA: Insufficient documentation

## 2016-10-22 DIAGNOSIS — Z9049 Acquired absence of other specified parts of digestive tract: Secondary | ICD-10-CM | POA: Diagnosis not present

## 2016-10-22 NOTE — Progress Notes (Signed)
Hematology/Oncology Consult note [facility]  [phone] Fax:([phone]  Patient Care Team: Vista Mink, [place] as PCP - General (Family Medicine) Robert Bellow, MD (General Surgery) Vista Mink, [place] (Family Medicine)   Name of the patient: Bonnie Gomez  [id number]  May 10, 1966   Date of visit: 10/22/16  Diagnosis- leucocytosis likely reactive  Chief complaint/ Reason for visit- discuss results of bloodwork  Heme/Onc history: patient is a 51 year old female who has been referred to [place] for leukocytosis. Blood work from 08/28/2016 was as follows: TSH was normal at 1.62. T3 and T4 within normal limits. BMP was within normal limits. CBC showed white count of 14 point with absolute neutrophilia, H&H of 16.1/47.4 and a platelet count of 265. Her past medical history is significant for hypothyroidism, anxiety disorder, COPD and hypertension as well as morbid obesity and other medical problems. Looking back at her prior CBCs she had an elevated white count of 15 and 18 on 2 occasions in 2013 again with absolute neutrophilia. White count in August 2017 was 11.4  Patient has been smoking 1 pack of cigarettes per day for the last 30 years. She reports problems with right knee pain. She is also had gallbladder surgery in 1980s and since then she has been having problems with dumping syndrome where she feels that she has to go to the restroom almost immediately after eating her food.   Results of bloowork from 10/01/16 reviewed today were as follows: cbc showed wbc of 8.7 which was normal. H/H 14.9/42.3 and platelet count of 256. Differential was normal. Review of peripheral smear showed neutrophilia. EPO level was 27.6. ESR and cRP were normal. JAK2 testing was negative. Flow cyto,etry did not reveal any leukemia or lymphoma  Interval history- reports feeling anxious. Denies other complaints  ECOG PS- 0 Pain scale- 3   Review of  systems- Review of Systems  Constitutional: Negative for chills, fever, malaise/fatigue and weight loss.  HENT: Negative for congestion, ear discharge and nosebleeds.   Eyes: Negative for blurred vision.  Respiratory: Negative for cough, hemoptysis, sputum production, shortness of breath and wheezing.   Cardiovascular: Negative for chest pain, palpitations, orthopnea and claudication.  Gastrointestinal: Negative for abdominal pain, blood in stool, constipation, diarrhea, heartburn, melena, nausea and vomiting.  Genitourinary: Negative for dysuria, flank pain, frequency, hematuria and urgency.  Musculoskeletal: Negative for back pain, joint pain and myalgias.  Skin: Negative for rash.  Neurological: Negative for dizziness, tingling, focal weakness, seizures, weakness and headaches.  Endo/Heme/Allergies: Does not bruise/bleed easily.  Psychiatric/Behavioral: Negative for depression and suicidal ideas. The patient is nervous/anxious. The patient does not have insomnia.      Current treatment- observation  Allergies  Allergen Reactions  . Varenicline Other (See Comments)    Agitation  . Duloxetine Hcl Anxiety    Agitation     Past Medical History:  Diagnosis Date  . Allergy   . Anxiety   . Arthritis   . Asthma 2001  . COPD (chronic obstructive pulmonary disease) ([place]) 2013  . Depression   . Emphysema lung ([place])   . 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 Hiatal hernia   . Hypertension   . Insomnia   . Overactive bladder   . Pneumonia    2013  . Thyroid disease   . Vitamin D deficiency      Past Surgical History:  Procedure Laterality Date  . ANGIOPLASTY  2008, 2012  .  CHOLECYSTECTOMY  1985  . COLONOSCOPY  2013  . SKIN BIOPSY  2006  . TUBAL LIGATION  1992  . UPPER GI ENDOSCOPY  2013   [place]    Social History   Social History  . Marital status: Single    Spouse name: N/A  . Number of children: N/A  . Years of education: N/A   Occupational  History  . Not on file.   Social History Main Topics  . Smoking status: Current Every Day Smoker    Packs/day: 1.00    Years: 35.00    Types: Cigarettes  . Smokeless tobacco: Never Used     Comment: "not now "per pt  . Alcohol use No  . Drug use: No  . Sexual activity: No   Other Topics Concern  . Not on file   Social History Narrative  . No narrative on file    Family History  Problem Relation Age of Onset  . Cancer Mother     lung  . Cancer Father     lung  . Cancer Maternal Grandmother     breast  . Scoliosis Brother   . Arthritis Brother   . Uterine cancer Paternal Grandmother      Current Outpatient Prescriptions:  .  albuterol (PROAIR HFA) 108 (90 BASE) MCG/ACT inhaler, Inhale 2 puffs into the lungs every 6 (six) hours as needed for wheezing or shortness of breath. , Disp: , Rfl:  .  albuterol (PROVENTIL) (2.5 MG/3ML) 0.083% nebulizer solution, INHALE 1 VIAL USING NEBULIZER EVERY SIX HOURS AS NEEDED, Disp: , Rfl:  .  arformoterol (BROVANA) 15 MCG/2ML NEBU, Inhale into the lungs., Disp: , Rfl:  .  aspirin EC 81 MG tablet, Take 81 mg by mouth every morning. , Disp: , Rfl:  .  baclofen (LIORESAL) 10 MG tablet, Take 10 mg by mouth 3 (three) times daily as needed for muscle spasms., Disp: , Rfl:  .  BD INTEGRA SYRINGE 25G X 1" 3 ML MISC, USE WITH B12, Disp: , Rfl: 5 .  butalbital-acetaminophen-caffeine (FIORICET) 50-325-40 MG tablet, Take 1 tablet by mouth every 4 (four) hours as needed for headache., Disp: , Rfl:  .  Cholecalciferol (VITAMIN D3) 5000 units CAPS, Take 10,000 Units by mouth every morning., Disp: , Rfl:  .  clonazePAM (KLONOPIN) 1 MG tablet, Take 1 mg by mouth 3 (three) times daily as needed for anxiety. , Disp: , Rfl:  .  cyanocobalamin (,VITAMIN B-12,) 1000 MCG/ML injection, Inject 1,000 mcg into the muscle every 30 (thirty) days. Use on Saturdays. , Disp: , Rfl:  .  DALIRESP 500 MCG TABS tablet, Take 500 mcg by mouth every evening. , Disp: , Rfl: 2 .   estradiol (ESTRACE) 0.5 MG tablet, Take 0.5 mg by mouth every morning. For 21 days. Do not take for 1 week, then start again., Disp: , Rfl:  .  fentaNYL (DURAGESIC - DOSED MCG/HR) 50 MCG/HR, APPLY 1 PATCH EVERY 72 HOURS. REMOVE OLD PATCH BEFORE APPLYING A NEW ONE., Disp: , Rfl: 0 .  FLUoxetine (PROZAC) 20 MG tablet, Take 20 mg by mouth daily., Disp: , Rfl:  .  Fluticasone Furoate-Vilanterol 100-25 MCG/INH AEPB, Inhale 1 puff into the lungs daily. , Disp: , Rfl:  .  furosemide (LASIX) 40 MG tablet, Take 40 mg by mouth daily as needed for fluid or edema. , Disp: , Rfl: 5 .  gabapentin (NEURONTIN) 300 MG capsule, Take 300 mg by mouth every 6 (six) hours. , Disp: , Rfl:  4 .  guaiFENesin-codeine 100-10 MG/5ML syrup, Take 10 mLs by mouth every 4 (four) hours as needed for cough. (Patient not taking: Reported on 10/01/2016), Disp: 120 mL, Rfl: 0 .  hydrochlorothiazide (HYDRODIURIL) 25 MG tablet, Take 25 mg by mouth daily., Disp: , Rfl: 2 .  ketoconazole (NIZORAL) 2 % cream, APPLY A SMALL AMOUNT TOPICALLY TWO TIMES A DAY, Disp: , Rfl: 2 .  levothyroxine (SYNTHROID, LEVOTHROID) 50 MCG tablet, Take 50 mcg by mouth every morning., Disp: , Rfl: 2 .  losartan (COZAAR) 50 MG tablet, Take 50 mg by mouth every morning., Disp: , Rfl:  .  mometasone (ELOCON) 0.1 % cream, Apply 1 application topically 2 (two) times daily., Disp: , Rfl:  .  nicotine (NICODERM CQ - DOSED IN MG/24 HOURS) 21 mg/24hr patch, Place 1 patch (21 mg total) onto the skin daily., Disp: 28 patch, Rfl: 0 .  oxyCODONE-acetaminophen (PERCOCET) 7.5-325 MG tablet, TAKE 1 TABLET BY MOUTH 4 TIMES A DAY FOR 31 DAYS, Disp: , Rfl: 0 .  potassium chloride (KLOR-CON M10) 10 MEQ tablet, Take 10 mEq by mouth every morning., Disp: , Rfl:  .  promethazine (PHENERGAN) 25 MG tablet, Take 25 mg by mouth 2 (two) times daily as needed for nausea., Disp: , Rfl:  .  tiotropium (SPIRIVA) 18 MCG inhalation capsule, Place 18 mcg into inhaler and inhale daily. , Disp: , Rfl:    .  traMADol (ULTRAM) 50 MG tablet, Take 50 mg by mouth See admin instructions. Take 50 mg by mouth every 6 to 8 hours as needed for pain., Disp: , Rfl: 1 .  valACYclovir (VALTREX) 500 MG tablet, Take 500 mg by mouth daily., Disp: , Rfl: 4 .  VESICARE 10 MG tablet, Take 10 mg by mouth daily., Disp: , Rfl: 1  Physical exam:  Vitals:   10/22/16 1440  BP: 139/89  Pulse: 78  Resp: 20  Temp: (!) 96.4 F (35.8 C)  TempSrc: Tympanic  Weight: 197 lb 4.8 oz (89.5 kg)   Physical Exam  Constitutional: She is oriented to person, place, and time and well-developed, well-nourished, and in no distress.  HENT:  Head: Normocephalic and atraumatic.  Eyes: EOM are normal. Pupils are equal, round, and reactive to light.  Neck: Normal range of motion.  Cardiovascular: Normal rate, regular rhythm and normal heart sounds.   Pulmonary/Chest: Effort normal and breath sounds normal.  Abdominal: Soft. Bowel sounds are normal.  Neurological: She is alert and oriented to person, place, and time.  Skin: Skin is warm and dry.     CMP Latest Ref Rng & Units 02/29/2016  Glucose 65 - 99 mg/dL 147(H)  BUN 6 - 20 mg/dL 8  Creatinine 0.44 - 1.00 mg/dL 0.87  Sodium 135 - 145 mmol/L 136  Potassium 3.5 - 5.1 mmol/L 3.9  Chloride 101 - 111 mmol/L 100(L)  CO2 22 - 32 mmol/L 28  Calcium 8.9 - 10.3 mg/dL 8.6(L)  Total Protein 6.5 - 8.1 g/dL 6.7  Total Bilirubin 0.3 - 1.2 mg/dL 1.0  Alkaline Phos 38 - 126 U/L 66  AST 15 - 41 U/L 16  ALT 14 - 54 U/L 11(L)   CBC Latest Ref Rng & Units 10/01/2016  WBC 3.6 - 11.0 K/uL 8.7  Hemoglobin 12.0 - 16.0 g/dL 14.9  Hematocrit 35.0 - 47.0 % 42.3  Platelets 150 - 440 K/uL 256    No images are attached to the encounter.  No results found.   Assessment and plan- Patient is a  51 y.o. female referred to [place] for leucocytosis likely reactive  Leucocytosis- did not persist on repeat check. All studies as mentioned above were unremarkable. Leucocytosis likely reactive. No  further investigation warranted at this tme such as bone marrow biopsy. Patient can continue to f/u with pcp and be referred in the future if it consistently trends upeards  Mild polycythemia- did not persist on repeat check. Likely from smoking. That also explains the high EPO level. No indication for phlebotomy. Continue to f/u with pcp  Tobacco dependence- Patient agreeable for quit smart program and we will make the referral. She does not meet criteria for low dose screening CT  RTC prn   Visit Diagnosis 1. Bandemia   2. Tobacco dependence      Dr. Randa Evens, MD, MPH [facility] at [facility] Pager- [phone] 10/22/2016 3:53 PM

## 2016-10-22 NOTE — Progress Notes (Signed)
Here for follow up

## 2016-10-28 ENCOUNTER — Encounter: Payer: Self-pay | Admitting: *Deleted

## 2017-01-08 LAB — HM PAP SMEAR: HM Pap smear: NEGATIVE

## 2017-01-28 ENCOUNTER — Other Ambulatory Visit: Payer: Self-pay | Admitting: Obstetrics and Gynecology

## 2017-01-28 DIAGNOSIS — N6452 Nipple discharge: Secondary | ICD-10-CM

## 2017-04-15 ENCOUNTER — Encounter: Payer: Self-pay | Admitting: Certified Nurse Midwife

## 2017-04-22 ENCOUNTER — Encounter: Payer: Self-pay | Admitting: Certified Nurse Midwife

## 2017-04-29 ENCOUNTER — Encounter: Payer: Self-pay | Admitting: Certified Nurse Midwife

## 2017-04-29 ENCOUNTER — Ambulatory Visit (INDEPENDENT_AMBULATORY_CARE_PROVIDER_SITE_OTHER): Payer: Medicare Other | Admitting: Certified Nurse Midwife

## 2017-04-29 VITALS — BP 148/94 | HR 80 | Ht 64.0 in | Wt 201.6 lb

## 2017-04-29 DIAGNOSIS — Z780 Asymptomatic menopausal state: Secondary | ICD-10-CM | POA: Diagnosis not present

## 2017-04-29 DIAGNOSIS — Z79818 Long term (current) use of other agents affecting estrogen receptors and estrogen levels: Secondary | ICD-10-CM

## 2017-04-29 DIAGNOSIS — R32 Unspecified urinary incontinence: Secondary | ICD-10-CM | POA: Diagnosis not present

## 2017-04-29 DIAGNOSIS — Z9889 Other specified postprocedural states: Secondary | ICD-10-CM | POA: Diagnosis not present

## 2017-04-29 DIAGNOSIS — Z8742 Personal history of other diseases of the female genital tract: Secondary | ICD-10-CM

## 2017-04-29 NOTE — Patient Instructions (Signed)
Preventive Care 40-64 Years, Female Preventive care refers to lifestyle choices and visits with your health care provider that can promote health and wellness. What does preventive care include?  A yearly physical exam. This is also called an annual well check.  Dental exams once or twice a year.  Routine eye exams. Ask your health care provider how often you should have your eyes checked.  Personal lifestyle choices, including: ? Daily care of your teeth and gums. ? Regular physical activity. ? Eating a healthy diet. ? Avoiding tobacco and drug use. ? Limiting alcohol use. ? Practicing safe sex. ? Taking low-dose aspirin daily starting at age 53. ? Taking vitamin and mineral supplements as recommended by your health care provider. What happens during an annual well check? The services and screenings done by your health care provider during your annual well check will depend on your age, overall health, lifestyle risk factors, and family history of disease. Counseling Your health care provider may ask you questions about your:  Alcohol use.  Tobacco use.  Drug use.  Emotional well-being.  Home and relationship well-being.  Sexual activity.  Eating habits.  Work and work Statistician.  Method of birth control.  Menstrual cycle.  Pregnancy history.  Screening You may have the following tests or measurements:  Height, weight, and BMI.  Blood pressure.  Lipid and cholesterol levels. These may be checked every 5 years, or more frequently if you are over 53 years old.  Skin check.  Lung cancer screening. You may have this screening every year starting at age 53 if you have a 30-pack-year history of smoking and currently smoke or have quit within the past 15 years.  Fecal occult blood test (FOBT) of the stool. You may have this test every year starting at age 53.  Flexible sigmoidoscopy or colonoscopy. You may have a sigmoidoscopy every 5 years or a colonoscopy  every 10 years starting at age 53.  Hepatitis C blood test.  Hepatitis B blood test.  Sexually transmitted disease (STD) testing.  Diabetes screening. This is done by checking your blood sugar (glucose) after you have not eaten for a while (fasting). You may have this done every 1-3 years.  Mammogram. This may be done every 1-2 years. Talk to your health care provider about when you should start having regular mammograms. This may depend on whether you have a family history of breast cancer.  BRCA-related cancer screening. This may be done if you have a family history of breast, ovarian, tubal, or peritoneal cancers.  Pelvic exam and Pap test. This may be done every 3 years starting at age 53. Starting at age 53, this may be done every 5 years if you have a Pap test in combination with an HPV test.  Bone density scan. This is done to screen for osteoporosis. You may have this scan if you are at high risk for osteoporosis.  Discuss your test results, treatment options, and if necessary, the need for more tests with your health care provider. Vaccines Your health care provider may recommend certain vaccines, such as:  Influenza vaccine. This is recommended every year.  Tetanus, diphtheria, and acellular pertussis (Tdap, Td) vaccine. You may need a Td booster every 10 years.  Varicella vaccine. You may need this if you have not been vaccinated.  Zoster vaccine. You may need this after age 53.  Measles, mumps, and rubella (MMR) vaccine. You may need at least one dose of MMR if you were born in  1959 or later. You may also need a second dose.  Pneumococcal 13-valent conjugate (PCV13) vaccine. You may need this if you have certain conditions and were not previously vaccinated.  Pneumococcal polysaccharide (PPSV23) vaccine. You may need one or two doses if you smoke cigarettes or if you have certain conditions.  Meningococcal vaccine. You may need this if you have certain  conditions.  Hepatitis A vaccine. You may need this if you have certain conditions or if you travel or work in places where you may be exposed to hepatitis A.  Hepatitis B vaccine. You may need this if you have certain conditions or if you travel or work in places where you may be exposed to hepatitis B.  Haemophilus influenzae type b (Hib) vaccine. You may need this if you have certain conditions.  Talk to your health care provider about which screenings and vaccines you need and how often you need them. This information is not intended to replace advice given to you by your health care provider. Make sure you discuss any questions you have with your health care provider. Document Released: 08/02/2015 Document Revised: 03/25/2016 Document Reviewed: 05/07/2015 Elsevier Interactive Patient Education  2017 [employer].

## 2017-04-30 NOTE — Progress Notes (Signed)
GYN ENCOUNTER NOTE  Subjective:       Bonnie Gomez is a 51 y.o. [phone] female here for gynecologic evaluation of after cervical polypectomy in 01/2017.  Questions possibility of additional polyps on cervix or in uterus. Also, she would like a referral to urology for urinary frequency, urgency and leakage.   Denies respiratory distress, chest pain, abdominal pain, vaginal bleeding, dysuria, and leg pain or swelling.    Gynecologic History  No LMP recorded. Patient is postmenopausal.  Contraception: post menopausal status  Last Pap: 01/22/2017. Results were: normal  Last mammogram: 02/10/2017. Normal.   Obstetric History  OB History  Gravida Para Term Preterm AB Living  4 2     2 2   SAB TAB Ectopic Multiple Live Births      2   2    # Outcome Date GA Lbr Len/2nd Weight Sex Delivery Anes PTL Lv  4 Ectopic           3 Ectopic           2 Para         LIV  1 Para         LIV    Obstetric Comments  1st Menstrual Cycle:  13  1st Pregnancy:  16    Past Medical History:  Diagnosis Date  . Allergy   . Anxiety   . Arthritis   . Asthma 2001  . COPD (chronic obstructive pulmonary disease) ([place]) 2013  . Depression   . Emphysema lung ([place])   . 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 Hiatal hernia   . Hypertension   . Insomnia   . Overactive bladder   . Pneumonia    2013  . Thyroid disease   . Vitamin D deficiency     Past Surgical History:  Procedure Laterality Date  . ANGIOPLASTY  2008, 2012  . CHOLECYSTECTOMY  1985  . COLONOSCOPY  2013  . SKIN BIOPSY  2006  . TUBAL LIGATION  1992  . UPPER GI ENDOSCOPY  2013   [facility]    Current Outpatient Prescriptions on File Prior to Visit  Medication Sig Dispense Refill  . albuterol (PROAIR HFA) 108 (90 BASE) MCG/ACT inhaler Inhale 2 puffs into the lungs every 6 (six) hours as needed for wheezing or shortness of breath.     Marland Kitchen albuterol (PROVENTIL) (2.5 MG/3ML) 0.083% nebulizer solution INHALE 1 VIAL  USING NEBULIZER EVERY SIX HOURS AS NEEDED    . arformoterol (BROVANA) 15 MCG/2ML NEBU Inhale into the lungs.    Marland Kitchen aspirin EC 81 MG tablet Take 81 mg by mouth every morning.     . baclofen (LIORESAL) 10 MG tablet Take 10 mg by mouth 3 (three) times daily as needed for muscle spasms.    . BD INTEGRA SYRINGE 25G X 1" 3 ML MISC USE WITH B12  5  . butalbital-acetaminophen-caffeine (FIORICET) 50-325-40 MG tablet Take 1 tablet by mouth every 4 (four) hours as needed for headache.    Hendricks Limes CONTINUING MONTH PAK 1 MG tablet     . Cholecalciferol (VITAMIN D3) 5000 units CAPS Take 10,000 Units by mouth every morning.    . clonazePAM (KLONOPIN) 1 MG tablet Take 1 mg by mouth 3 (three) times daily as needed for anxiety.     . cyanocobalamin (,VITAMIN B-12,) 1000 MCG/ML injection Inject 1,000 mcg into the muscle every 30 (thirty) days. Use on Saturdays.     Marland Kitchen DALIRESP 500  MCG TABS tablet Take 500 mcg by mouth every evening.   2  . DULoxetine (CYMBALTA) 60 MG capsule     . estradiol (ESTRACE) 0.5 MG tablet Take 0.5 mg by mouth every morning. For 21 days. Do not take for 1 week, then start again.    . fentaNYL (DURAGESIC - DOSED MCG/HR) 50 MCG/HR APPLY 1 PATCH EVERY 72 HOURS. REMOVE OLD PATCH BEFORE APPLYING A NEW ONE.  0  . FLUoxetine (PROZAC) 40 MG capsule     . Fluticasone Furoate-Vilanterol 100-25 MCG/INH AEPB Inhale 1 puff into the lungs daily.     . furosemide (LASIX) 40 MG tablet Take 40 mg by mouth daily as needed for fluid or edema.   5  . gabapentin (NEURONTIN) 300 MG capsule Take 300 mg by mouth every 6 (six) hours.   4  . hydrochlorothiazide (HYDRODIURIL) 25 MG tablet Take 25 mg by mouth daily.  2  . ketoconazole (NIZORAL) 2 % cream APPLY A SMALL AMOUNT TOPICALLY TWO TIMES A DAY  2  . levothyroxine (SYNTHROID, LEVOTHROID) 50 MCG tablet Take 50 mcg by mouth every morning.  2  . losartan (COZAAR) 50 MG tablet Take 50 mg by mouth every morning.    . mometasone (ELOCON) 0.1 % cream Apply 1  application topically 2 (two) times daily.    . nicotine (NICODERM CQ - DOSED IN MG/24 HOURS) 21 mg/24hr patch Place 1 patch (21 mg total) onto the skin daily. 28 patch 0  . oxyCODONE-acetaminophen (PERCOCET) 7.5-325 MG tablet TAKE 1 TABLET BY MOUTH 4 TIMES A DAY FOR 31 DAYS  0  . potassium chloride (KLOR-CON M10) 10 MEQ tablet Take 10 mEq by mouth every morning.    . promethazine (PHENERGAN) 25 MG tablet Take 25 mg by mouth 2 (two) times daily as needed for nausea.    Marland Kitchen tiotropium (SPIRIVA) 18 MCG inhalation capsule Place 18 mcg into inhaler and inhale daily.     . traMADol (ULTRAM) 50 MG tablet Take 50 mg by mouth See admin instructions. Take 50 mg by mouth every 6 to 8 hours as needed for pain.  1  . valACYclovir (VALTREX) 500 MG tablet Take 500 mg by mouth daily.  4  . VESICARE 10 MG tablet Take 10 mg by mouth daily.  1   No current facility-administered medications on file prior to visit.     Allergies  Allergen Reactions  . Varenicline Other (See Comments)    Agitation  . Duloxetine Hcl Anxiety    Agitation    Social History   Social History  . Marital status: Single    Spouse name: N/A  . Number of children: N/A  . Years of education: N/A   Occupational History  . Not on file.   Social History Main Topics  . Smoking status: Current Every Day Smoker    Packs/day: 1.00    Years: 35.00    Types: Cigarettes  . Smokeless tobacco: Never Used     Comment: "not now "per pt  . Alcohol use No  . Drug use: No  . Sexual activity: No   Other Topics Concern  . Not on file   Social History Narrative  . No narrative on file    Family History  Problem Relation Age of Onset  . Cancer Mother        lung  . Cancer Father        lung  . Cancer Maternal Grandmother  breast  . Scoliosis Brother   . Arthritis Brother   . Uterine cancer Paternal Grandmother     The following portions of the patient's history were reviewed and updated as appropriate: allergies,  current medications, past family history, past medical history, past social history, past surgical history and problem list.  Review of Systems  Review of Systems - Negative except as noted above.  History obtained from the patient.   Objective:   BP (!) 148/94   Pulse 80   Ht 5\' 4"  (1.626 m)   Wt 201 lb 9.6 oz (91.4 kg)   BMI 34.60 kg/m    CONSTITUTIONAL: Well-developed, well-nourished female in no acute distress.   HENT:  Normocephalic, atraumatic.   NECK: Normal range of motion, supple, no masses.    SKIN: Skin is warm and dry. No rash noted. Not diaphoretic. No erythema. No pallor.  [place]: Alert and oriented to person, place, and time.   PSYCHIATRIC: Normal mood and affect. Normal behavior. Normal judgment and thought content.  CVS exam: normal rate, regular rhythm, normal S1, S2, no murmurs, rubs, clicks or gallops.  Lungs - Normal respiratory effort, chest expands symmetrically. Lungs are clear to auscultation except wheezes noted in bilateral lower lobes.  BREASTS: Not Examined  ABDOMEN: Soft, non distended; Non tender.  No Organomegaly.  PELVIC:  External Genitalia: Normal  BUS: Normal  Vagina: Normal  Cervix: Normal  Uterus: Normal size, shape,consistency, mobile  Adnexa: Normal   MUSCULOSKELETAL: Normal range of motion. No tenderness.  No cyanosis, clubbing, or edema.  Assessment:   1. History of cervical polypectomy   Plan:   Exam findings reviewed with pt, verbalized understanding.   Referral urology, see orders.   Reviewed red flag symptoms and when to call.   RTC x 1-2 weeks for ultrasound. RTC x 2-3 weeks to discuss results and plan of care.    Diona Fanti, CNM

## 2017-05-03 ENCOUNTER — Encounter: Payer: Self-pay | Admitting: Certified Nurse Midwife

## 2017-05-03 DIAGNOSIS — Z780 Asymptomatic menopausal state: Secondary | ICD-10-CM | POA: Insufficient documentation

## 2017-05-13 ENCOUNTER — Ambulatory Visit (INDEPENDENT_AMBULATORY_CARE_PROVIDER_SITE_OTHER): Payer: Medicare Other

## 2017-05-13 DIAGNOSIS — Z8742 Personal history of other diseases of the female genital tract: Secondary | ICD-10-CM | POA: Diagnosis not present

## 2017-05-13 DIAGNOSIS — Z9889 Other specified postprocedural states: Secondary | ICD-10-CM

## 2017-05-13 DIAGNOSIS — Z79818 Long term (current) use of other agents affecting estrogen receptors and estrogen levels: Secondary | ICD-10-CM

## 2017-05-18 ENCOUNTER — Encounter: Payer: Self-pay | Admitting: Urology

## 2017-05-18 ENCOUNTER — Ambulatory Visit (INDEPENDENT_AMBULATORY_CARE_PROVIDER_SITE_OTHER): Payer: Medicare Other | Admitting: Urology

## 2017-05-18 VITALS — BP 152/90 | HR 79 | Ht 64.0 in | Wt 203.5 lb

## 2017-05-18 DIAGNOSIS — N3941 Urge incontinence: Secondary | ICD-10-CM

## 2017-05-18 DIAGNOSIS — R32 Unspecified urinary incontinence: Secondary | ICD-10-CM

## 2017-05-18 DIAGNOSIS — N3946 Mixed incontinence: Secondary | ICD-10-CM

## 2017-05-18 LAB — MICROSCOPIC EXAMINATION
BACTERIA UA: NONE SEEN
RBC, UA: NONE SEEN /hpf (ref 0–?)
WBC, UA: NONE SEEN /hpf (ref 0–?)

## 2017-05-18 LAB — URINALYSIS, COMPLETE
BILIRUBIN UA: NEGATIVE
Glucose, UA: NEGATIVE
KETONES UA: NEGATIVE
LEUKOCYTES UA: NEGATIVE
Nitrite, UA: NEGATIVE
PROTEIN UA: NEGATIVE
RBC UA: NEGATIVE
SPEC GRAV UA: 1.025 (ref 1.005–1.030)
Urobilinogen, Ur: 0.2 mg/dL (ref 0.2–1.0)
pH, UA: 5 (ref 5.0–7.5)

## 2017-05-18 LAB — BLADDER SCAN AMB NON-IMAGING: Scan Result: 14

## 2017-05-18 NOTE — Progress Notes (Signed)
05/18/2017 10:25 AM   Bonnie Gomez [birth year] [id number]  Referring provider: Remi Haggard, [address] [place], [place] [address]  Chief Complaint  Patient presents with  . Urinary Incontinence    HPI: For the past she complains of nocturia, urgency and UUI. Standing she will leak and is a big trigger. She leaks some when she coughs and sneezes. She has asthma and smokes. She voids with a good flow. She wears 3 ppd and a heavy pad at night. She has frequency every 2-3 hours. On Vesicare 10 mg. Lasix also worsens. NG risk includes disc bulge and sciatica. No pelvic surgery. No prolapse symptoms. 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    UA clear. PVR 14 ml.   Modifying factors: There are no other modifying factors  Associated signs and symptoms: There are no other associated signs and symptoms Aggravating and relieving factors: There are no other aggravating or relieving factors Severity: Moderate Duration: Persistent   PMH: Past Medical History:  Diagnosis Date  . Allergy   . Anxiety   . Arthritis   . Asthma 2001  . COPD (chronic obstructive pulmonary disease) ([place]) 2013  . Depression   . Emphysema lung ([place])   . 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 Hiatal hernia   . Hypertension   . Insomnia   . Overactive bladder   . Pneumonia    2013  . Thyroid disease   . Vitamin D deficiency     Surgical History: Past Surgical History:  Procedure Laterality Date  . ANGIOPLASTY  2008, 2012  . CHOLECYSTECTOMY  1985  . COLONOSCOPY  2013  . GYNECOLOGIC CRYOSURGERY  1993  . SKIN BIOPSY  2006  . TUBAL LIGATION  1992  . UPPER GI ENDOSCOPY  2013   [facility]    Home Medications:  Allergies as of 05/18/2017      Reactions   Duloxetine Other (See Comments)   Agitation   Varenicline Other (See Comments)   Agitation   Duloxetine Hcl Anxiety   Agitation      Medication List       Accurate as of 05/18/17 10:25 AM. Always use your most recent med list.          PROAIR HFA 108 (90 Base) MCG/ACT inhaler Generic drug:  albuterol Inhale 2 puffs into the lungs every 6 (six) hours as needed for wheezing or shortness of breath.   albuterol (2.5 MG/3ML) 0.083% nebulizer solution Commonly known as:  PROVENTIL INHALE 1 VIAL USING NEBULIZER EVERY SIX HOURS AS NEEDED   arformoterol 15 MCG/2ML Nebu Commonly known as:  BROVANA Inhale into the lungs.   arformoterol 15 MCG/2ML Nebu Commonly known as:  BROVANA Inhale into the lungs.   aspirin EC 81 MG tablet Take 81 mg by mouth every morning.   baclofen 10 MG tablet Commonly known as:  LIORESAL Take 10 mg by mouth 3 (three) times daily as needed for muscle spasms.   BD INTEGRA SYRINGE 25G X 1" 3 ML Misc Generic drug:  SYRINGE-NEEDLE (DISP) 3 ML USE WITH B12   budesonide 0.5 MG/2ML nebulizer solution Commonly known as:  PULMICORT TAKE 2 MLS (0.5 MG TOTAL) BY NEBULIZATION ONCE DAILY.   CHANTIX CONTINUING MONTH PAK 1 MG tablet Generic drug:  varenicline   clonazePAM 1 MG tablet Commonly known as:  KLONOPIN Take 1 mg by mouth 3 (three) times daily as needed for anxiety.   cyanocobalamin 1000 MCG/ML injection Commonly known as:  (  VITAMIN B-12) Inject 1,000 mcg into the muscle every 30 (thirty) days. Use on Saturdays.   DALIRESP 500 MCG Tabs tablet Generic drug:  roflumilast Take 500 mcg by mouth every evening.   DULoxetine 60 MG capsule Commonly known as:  CYMBALTA   estradiol 0.5 MG tablet Commonly known as:  ESTRACE Take 0.5 mg by mouth every morning. For 21 days. Do not take for 1 week, then start again.   fentaNYL 50 MCG/HR Commonly known as:  DURAGESIC - dosed mcg/hr APPLY 1 PATCH EVERY 72 HOURS. REMOVE OLD PATCH BEFORE APPLYING A NEW ONE.   FIORICET 50-325-40 MG tablet Generic drug:  butalbital-acetaminophen-caffeine Take 1 tablet by mouth every 4 (four) hours as needed for headache.   FLUoxetine 40 MG capsule Commonly known as:  PROZAC   fluticasone  furoate-vilanterol 100-25 MCG/INH Aepb Commonly known as:  BREO ELLIPTA Inhale 1 puff into the lungs daily.   furosemide 40 MG tablet Commonly known as:  LASIX Take 40 mg by mouth daily as needed for fluid or edema.   gabapentin 300 MG capsule Commonly known as:  NEURONTIN Take 300 mg by mouth every 6 (six) hours.   hydrochlorothiazide 25 MG tablet Commonly known as:  HYDRODIURIL Take 25 mg by mouth daily.   ketoconazole 2 % cream Commonly known as:  NIZORAL APPLY A SMALL AMOUNT TOPICALLY TWO TIMES A DAY   KLOR-CON M10 10 MEQ tablet Generic drug:  potassium chloride Take 10 mEq by mouth every morning.   levothyroxine 50 MCG tablet Commonly known as:  SYNTHROID, LEVOTHROID Take 50 mcg by mouth every morning.   losartan 50 MG tablet Commonly known as:  COZAAR Take 50 mg by mouth every morning.   medroxyPROGESTERone 2.5 MG tablet Commonly known as:  PROVERA Take 2.5 mg by mouth daily.   mometasone 0.1 % cream Commonly known as:  ELOCON Apply 1 application topically 2 (two) times daily.   nicotine 21 mg/24hr patch Commonly known as:  NICODERM CQ - dosed in mg/24 hours Place 1 patch (21 mg total) onto the skin daily.   nystatin powder Commonly known as:  MYCOSTATIN/NYSTOP APPLY SMALL AMOUNT TOPICALLY TWO TIMES A DAY AS NEEDED   ondansetron 4 MG disintegrating tablet Commonly known as:  ZOFRAN-ODT   oxyCODONE-acetaminophen 7.5-325 MG tablet Commonly known as:  PERCOCET TAKE 1 TABLET BY MOUTH 4 TIMES A DAY FOR 31 DAYS   pantoprazole 40 MG tablet Commonly known as:  PROTONIX Take 40 mg by mouth daily.   promethazine 25 MG tablet Commonly known as:  PHENERGAN Take 25 mg by mouth 2 (two) times daily as needed for nausea.   tiotropium 18 MCG inhalation capsule Commonly known as:  SPIRIVA Place 18 mcg into inhaler and inhale daily.   traMADol 50 MG tablet Commonly known as:  ULTRAM Take 50 mg by mouth See admin instructions. Take 50 mg by mouth every 6 to 8  hours as needed for pain.   valACYclovir 500 MG tablet Commonly known as:  VALTREX Take 500 mg by mouth daily.   VESICARE 10 MG tablet Generic drug:  solifenacin Take 10 mg by mouth daily.   Vitamin D3 5000 units Caps Take 10,000 Units by mouth every morning.       Allergies:  Allergies  Allergen Reactions  . Duloxetine Other (See Comments)    Agitation  . Varenicline Other (See Comments)    Agitation  . Duloxetine Hcl Anxiety    Agitation    Family History: Family History  Problem Relation Age of Onset  . Cancer Mother        lung  . Cancer Father        lung  . Cancer Maternal Grandmother        breast  . Scoliosis Brother   . Arthritis Brother   . Uterine cancer Paternal Grandmother     Social History:  reports that she has been smoking Cigarettes.  She has a 35.00 pack-year smoking history. She has never used smokeless tobacco. She reports that she does not drink alcohol or use drugs.  ROS:                                        Physical Exam: There were no vitals taken for this visit.  Constitutional:  Alert and oriented, No acute distress. HEENT: [place] AT, moist mucus membranes.  Trachea midline, no masses. Cardiovascular: No clubbing, cyanosis, or edema. Respiratory: Normal respiratory effort, no increased work of breathing. GI: Abdomen is soft, nontender, nondistended, no abdominal masses GU: No CVA tenderness.  Skin: No rashes, bruises or suspicious lesions. Neurologic: Grossly intact, no focal deficits, moving all 4 extremities. Psychiatric: Normal mood and affect.  Laboratory Data: Lab Results  Component Value Date   WBC 8.7 10/01/2016   HGB 14.9 10/01/2016   HCT 42.3 10/01/2016   MCV 90.2 10/01/2016   PLT 256 10/01/2016    Lab Results  Component Value Date   CREATININE 0.87 02/29/2016    No results found for: PSA1  No results found for: TESTOSTERONE  No results found for: HGBA1C  Urinalysis Lab Results    Component Value Date   APPEARANCEUR CLEAR (A) 10/01/2016   LEUKOCYTESUR NEGATIVE 10/01/2016   PROTEINUR NEGATIVE 10/01/2016   GLUCOSEU NEGATIVE 10/01/2016   RBCU 0-5 10/01/2016   BILIRUBINUR NEGATIVE 10/01/2016   NITRITE NEGATIVE 10/01/2016    Lab Results  Component Value Date   BACTERIA NONE SEEN 10/01/2016    No results found for this or any previous visit. No results found for this or any previous visit. No results found for this or any previous visit. No results found for this or any previous visit. No results found for this or any previous visit. No results found for this or any previous visit. No results found for this or any previous visit. No results found for this or any previous visit.  Assessment & Plan:    1. Urinary incontinence, mixed -- add Myrbetriq - samples given. F/u with Dr. Matilde Sprang. Discussed PT, Myrbetriq, PTNS.   - Urinalysis, Complete - Bladder Scan (Post Void Residual) in office   No Follow-up on file.  Festus Aloe, [facility] [address], [place], [place] [address] [phone]

## 2017-05-20 ENCOUNTER — Ambulatory Visit (INDEPENDENT_AMBULATORY_CARE_PROVIDER_SITE_OTHER): Payer: Medicare Other | Admitting: Certified Nurse Midwife

## 2017-05-20 ENCOUNTER — Encounter: Payer: Self-pay | Admitting: Certified Nurse Midwife

## 2017-05-20 VITALS — BP 151/87 | HR 84 | Wt 203.5 lb

## 2017-05-20 DIAGNOSIS — Z09 Encounter for follow-up examination after completed treatment for conditions other than malignant neoplasm: Secondary | ICD-10-CM | POA: Diagnosis not present

## 2017-05-20 NOTE — Patient Instructions (Signed)
Black Cohosh, Cimicifuga racemosa oral dosage forms What is this medicine? BLACK COHOSH (blak KOH hosh) or Cimicifuga racemosa is a dietary supplement. It is promoted to relieve symptoms of menopause, such as hot flashes. The FDA has not approved this supplement for any medical use. This supplement may be used for other purposes; ask your health care provider or pharmacist if you have questions. This medicine may be used for other purposes; ask your health care provider or pharmacist if you have questions. What should I tell my health care provider before I take this medicine? They need to know if you have any of these conditions: -breast cancer -cervical, ovarian or uterine cancer -high blood pressure -infertility -liver disease -menstrual changes or irregular periods -unusual vaginal or uterine bleeding -an unusual or allergic reaction to black cohosh, soybeans, tartrazine dye (yellow dye number 5), other medicines, foods, dyes, or preservatives -pregnant or trying to get pregnant -breast-feeding How should I use this medicine? Take this herb by mouth with a glass of water. Follow the directions on the package labeling, or talk to your health care professional. Do not use for longer than 6 months without the advice of a health care professional. Do not use if you are pregnant or breast-feeding. Talk to your obstetrician-gynecologist or certified nurse-midwife. This herb is not for use in children under the age of 100 years. Overdosage: If you think you have taken too much of this medicine contact a poison control center or emergency room at once. NOTE: This medicine is only for you. Do not share this medicine with others. What if I miss a dose? If you miss a dose, take it as soon as you can. If it is almost time for your next dose, take only that dose. Do not take double or extra doses. What may interact with this medicine? -atorvastatin -cisplatin -fertility treatments This list may not  describe all possible interactions. Give your health care provider a list of all the medicines, herbs, non-prescription drugs, or dietary supplements you use. Also tell them if you smoke, drink alcohol, or use illegal drugs. Some items may interact with your medicine. What should I watch for while using this medicine? Since this herb is derived from a plant, allergic reactions are possible. Stop using this herb if you develop a rash. You may need to see your health care professional, or inform them that this occurred. Report any unusual side effects promptly. If you are taking this herb for menstrual or menopausal symptoms, visit your doctor or health care professional for regular checks on your progress. You should have a complete check-up every 6 months. You will need a regular breast and pelvic exam while on this therapy. Follow the advice of your doctor or health care professional. Women should inform their doctor if they wish to become pregnant or think they might be pregnant. If you have any reason to think you are pregnant, stop taking this herb at once and contact your doctor or health care professional. Herbal or dietary supplements are not regulated like medicines. Rigid quality control standards are not required for dietary supplements. The purity and strength of these products can vary. The safety and effect of this dietary supplement for a certain disease or illness is not well known. This product is not intended to diagnose, treat, cure or prevent any disease. The Food and Drug Administration suggests the following to help consumers protect themselves: -Always read product labels and follow directions. -Natural does not mean a product is  safe for humans to take. -Look for products that include USP after the ingredient name. This means that the manufacturer followed the standards of the U.S. Pharmacopoeia. -Supplements made or sold by a nationally known food or drug company are more likely to  be made under tight controls. You can write to the company for more information about how the product was made. What side effects may I notice from receiving this medicine? Side effects that you should report to your doctor or health care professional as soon as possible: -allergic reactions like skin rash, itching or hives, swelling of the face, lips, or tongue -breathing problems -dizziness -palpitations -signs and symptoms of liver injury like dark yellow or brown urine; general ill feeling or flu-like symptoms; light-colored stools; loss of appetite; nausea; right upper belly pain; unusually weak or tired; yellowing of the eyes or skin -unusual vaginal bleeding Side effects that usually do not require medical attention (report to your doctor or health care professional if they continue or are bothersome): -breast tenderness -headache -nausea -upset stomach This list may not describe all possible side effects. Call your doctor for medical advice about side effects. You may report side effects to FDA at 1-800-FDA-1088. Where should I keep my medicine? Keep out of the reach of children. Store at room temperature between 15 and 30 degrees C (59 and 86 degrees C). Throw away any unused herb after the expiration date. NOTE: This sheet is a summary. It may not cover all possible information. If you have questions about this medicine, talk to your doctor, pharmacist, or health care provider.  2018 Elsevier/Gold Standard (2016-01-15 14:35:09)

## 2017-05-23 ENCOUNTER — Encounter: Payer: Self-pay | Admitting: Certified Nurse Midwife

## 2017-05-23 NOTE — Progress Notes (Signed)
GYN ENCOUNTER NOTE  Subjective:       Bonnie Gomez is a 51 y.o. [phone] female here for ultrasound results review and follow up.   Denies difficulty breathing or respiratory distress, chest pain, abdominal pain, vaginal bleeding, and leg pain or swelling.   Questions appropriate treatment for hot flashes and other symptoms of menopause.    Gynecologic History  No LMP recorded. Patient is postmenopausal.  Contraception: post menopausal status  Last Pap: 01/22/2017. Results were: normal  Last mammogram: 02/10/2017. Results were: normal  Obstetric History  OB History  Gravida Para Term Preterm AB Living  4 2     2 2   SAB TAB Ectopic Multiple Live Births      2   2    # Outcome Date GA Lbr Len/2nd Weight Sex Delivery Anes PTL Lv  4 Ectopic           3 Ectopic           2 Para         LIV  1 Para         LIV    Obstetric Comments  1st Menstrual Cycle:  13  1st Pregnancy:  16    Past Medical History:  Diagnosis Date  . Allergy   . Anxiety   . Arthritis   . Asthma 2001  . COPD (chronic obstructive pulmonary disease) ([place]) 2013  . Depression   . Emphysema lung ([place])   . 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 Hiatal hernia   . Hypertension   . Insomnia   . Overactive bladder   . Pneumonia    2013  . Thyroid disease   . Vitamin D deficiency     Past Surgical History:  Procedure Laterality Date  . ANGIOPLASTY  2008, 2012  . CHOLECYSTECTOMY  1985  . COLONOSCOPY  2013  . GYNECOLOGIC CRYOSURGERY  1993  . SKIN BIOPSY  2006  . TUBAL LIGATION  1992  . UPPER GI ENDOSCOPY  2013   [facility]    Current Outpatient Medications on File Prior to Visit  Medication Sig Dispense Refill  . albuterol (PROAIR HFA) 108 (90 BASE) MCG/ACT inhaler Inhale 2 puffs into the lungs every 6 (six) hours as needed for wheezing or shortness of breath.     Marland Kitchen albuterol (PROVENTIL) (2.5 MG/3ML) 0.083% nebulizer solution INHALE 1 VIAL USING NEBULIZER EVERY SIX HOURS AS NEEDED     . arformoterol (BROVANA) 15 MCG/2ML NEBU Inhale into the lungs.    Marland Kitchen aspirin EC 81 MG tablet Take 81 mg by mouth every morning.     . baclofen (LIORESAL) 10 MG tablet Take 10 mg by mouth 3 (three) times daily as needed for muscle spasms.    . BD INTEGRA SYRINGE 25G X 1" 3 ML MISC USE WITH B12  5  . budesonide (PULMICORT) 0.5 MG/2ML nebulizer solution TAKE 2 MLS (0.5 MG TOTAL) BY NEBULIZATION ONCE DAILY.  11  . butalbital-acetaminophen-caffeine (FIORICET) 50-325-40 MG tablet Take 1 tablet by mouth every 4 (four) hours as needed for headache.    Hendricks Limes CONTINUING MONTH PAK 1 MG tablet     . Cholecalciferol (VITAMIN D3) 5000 units CAPS Take 10,000 Units by mouth every morning.    . clonazePAM (KLONOPIN) 1 MG tablet Take 1 mg by mouth 3 (three) times daily as needed for anxiety.     . cyanocobalamin (,VITAMIN B-12,) 1000 MCG/ML injection Inject 1,000 mcg into the  muscle every 30 (thirty) days. Use on Saturdays.     Marland Kitchen DALIRESP 500 MCG TABS tablet Take 500 mcg by mouth every evening.   2  . DULoxetine (CYMBALTA) 60 MG capsule     . estradiol (ESTRACE) 0.5 MG tablet Take 0.5 mg by mouth every morning. For 21 days. Do not take for 1 week, then start again.    . fentaNYL (DURAGESIC - DOSED MCG/HR) 50 MCG/HR APPLY 1 PATCH EVERY 72 HOURS. REMOVE OLD PATCH BEFORE APPLYING A NEW ONE.  0  . FLUoxetine (PROZAC) 40 MG capsule     . furosemide (LASIX) 40 MG tablet Take 40 mg by mouth daily as needed for fluid or edema.   5  . gabapentin (NEURONTIN) 300 MG capsule Take 300 mg by mouth every 6 (six) hours.   4  . hydrochlorothiazide (HYDRODIURIL) 25 MG tablet Take 25 mg by mouth daily.  2  . ketoconazole (NIZORAL) 2 % cream APPLY A SMALL AMOUNT TOPICALLY TWO TIMES A DAY  2  . levothyroxine (SYNTHROID, LEVOTHROID) 50 MCG tablet Take 50 mcg by mouth every morning.  2  . losartan (COZAAR) 50 MG tablet Take 50 mg by mouth every morning.    . medroxyPROGESTERone (PROVERA) 2.5 MG tablet Take 2.5 mg by mouth  daily.  2  . mometasone (ELOCON) 0.1 % cream Apply 1 application topically 2 (two) times daily.    . nicotine (NICODERM CQ - DOSED IN MG/24 HOURS) 21 mg/24hr patch Place 1 patch (21 mg total) onto the skin daily. 28 patch 0  . nystatin (MYCOSTATIN/NYSTOP) powder APPLY SMALL AMOUNT TOPICALLY TWO TIMES A DAY AS NEEDED  2  . ondansetron (ZOFRAN-ODT) 4 MG disintegrating tablet     . oxyCODONE-acetaminophen (PERCOCET) 7.5-325 MG tablet TAKE 1 TABLET BY MOUTH 4 TIMES A DAY FOR 31 DAYS  0  . pantoprazole (PROTONIX) 40 MG tablet Take 40 mg by mouth daily.  2  . potassium chloride (KLOR-CON M10) 10 MEQ tablet Take 10 mEq by mouth every morning.    . tiotropium (SPIRIVA) 18 MCG inhalation capsule Place 18 mcg into inhaler and inhale daily.     . valACYclovir (VALTREX) 500 MG tablet Take 500 mg by mouth daily.  4  . VESICARE 10 MG tablet Take 10 mg by mouth daily.  1   No current facility-administered medications on file prior to visit.     Allergies  Allergen Reactions  . Duloxetine Other (See Comments)    Agitation  . Varenicline Other (See Comments)    Agitation  . Duloxetine Hcl Anxiety    Agitation    Social History   Socioeconomic History  . Marital status: Single    Spouse name: Not on file  . Number of children: Not on file  . Years of education: Not on file  . Highest education level: Not on file  Social Needs  . Financial resource strain: Not on file  . Food insecurity - worry: Not on file  . Food insecurity - inability: Not on file  . Transportation needs - medical: Not on file  . Transportation needs - non-medical: Not on file  Occupational History  . Not on file  Tobacco Use  . Smoking status: Current Every Day Smoker    Packs/day: 1.00    Years: 35.00    Pack years: 35.00    Types: Cigarettes  . Smokeless tobacco: Never Used  . Tobacco comment: "not now "per pt  Substance and Sexual Activity  . Alcohol  use: No    Alcohol/week: 0.0 oz  . Drug use: No  . Sexual  activity: No  Other Topics Concern  . Not on file  Social History Narrative  . Not on file    Family History  Problem Relation Age of Onset  . Cancer Mother        lung  . Cancer Father        lung  . Cancer Maternal Grandmother        breast  . Scoliosis Brother   . Arthritis Brother   . Uterine cancer Paternal Grandmother   . Bladder Cancer Neg Hx   . Kidney cancer Neg Hx     The following portions of the patient's history were reviewed and updated as appropriate: allergies, current medications, past family history, past medical history, past social history, past surgical history and problem list.  Review of Systems  Review of Systems - Negative except as noted above. History obtained from the patient  Objective:   BP (!) 151/87   Pulse 84   Wt 203 lb 8 oz (92.3 kg)   BMI 34.93 kg/m    Alert and oriented x 4, no apparent distress.   Physical exam: not indicated.    ULTRASOUND REPORT  Location: [facility] Date of Service: 05/13/17   Indications: Hx cervical polypectomy Findings:  The uterus measures 6.2 x 3.3  x4.1cm. Echo texture is homogeneous without evidence of focal masses. The Endometrium measures 1.4 mm.  Right Ovary was not visualized. Left Ovary was not visualized Survey of the adnexa demonstrates no adnexal masses. There is no free fluid in the cul de sac.  Impression: 1. Anteverted uterus appears of normal size and contour. 2. Endometrium appears thin and smooth at 1.33mm.  Recommendations: 1.Clinical correlation with the patient's History and Physical Exam.  Assessment:   1. Follow up  Plan:   Results reviewed with patient, verbalized understanding.   Encouraged hormone free period for four (4) to six (6) weeks then recollection of labs to identify best treatment for symptoms of menopause. Advised to use black cohosh in the interim, pt agrees to plan.   Labs: see orders.   Reviewed red flag symptoms and when  to call.   RTC x 4-6 weeks for lab appointment or sooner if needed.    Diona Fanti, CNM

## 2017-06-14 ENCOUNTER — Encounter: Payer: Self-pay | Admitting: Urology

## 2017-06-14 ENCOUNTER — Ambulatory Visit (INDEPENDENT_AMBULATORY_CARE_PROVIDER_SITE_OTHER): Payer: Medicare Other | Admitting: Urology

## 2017-06-14 VITALS — BP 152/92 | HR 86 | Ht 64.0 in | Wt 203.0 lb

## 2017-06-14 DIAGNOSIS — N3946 Mixed incontinence: Secondary | ICD-10-CM

## 2017-06-14 MED ORDER — VESICARE 10 MG PO TABS: 10.0000 mg | ORAL_TABLET | Freq: Every day | ORAL | 11 refills | Status: DC

## 2017-06-14 MED ORDER — MIRABEGRON ER 50 MG PO TB24: 50.0000 mg | ORAL_TABLET | Freq: Every day | ORAL | 11 refills | Status: DC

## 2017-06-14 MED ORDER — SOLIFENACIN SUCCINATE 10 MG PO TABS: 10.0000 mg | ORAL_TABLET | Freq: Every day | ORAL | 11 refills | Status: DC

## 2017-06-14 NOTE — Progress Notes (Signed)
06/14/2017 4:22 PM   Bonnie Gomez 04-Dec-1965 [id number]  Referring provider: Remi Haggard, [address] [place], [place] [address]  Chief Complaint  Patient presents with  . Urinary Incontinence    HPI: Dr Alvis Lemmings: For the past she complains of nocturia, urgency and UUI. Standing she will leak and is a big trigger. She leaks some when she coughs and sneezes. She has asthma and smokes. She voids with a good flow. She wears 3 ppd and a heavy pad at night. She has frequency every 2-3 hours. On Vesicare 10 mg. Lasix also worsens. NG risk includes disc bulge and sciatica. No pelvic surgery. No prolapse symptoms. 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    I think she was placed on Vesicare and Myrbetriq  Today I repeated the history today.  For many years she leaks with coughing and sneezing and only if her bladder is full with bending and lifting.  She reports that she can live with this.  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  Now she wears 2 pads during the day and 1 at night.  She is about 60% better on a combination of Vesicare and Myrbetriq.  She actually took both of them together.  She voids 2-5 times a night and does have ankle edema and takes a diuretic.  She voids frequently during the day and cannot hold it for 2 hours.  Rarely she has high-volume flooding episodes like she did prior on Vesicare monotherapy  Modifying factors: There are no other modifying factors  Associated signs and symptoms: There are no other associated signs and symptoms Aggravating and relieving factors: There are no other aggravating or relieving factors Severity: Moderate Duration: Persistent    PMH: Past Medical History:  Diagnosis Date  . Allergy   . Anxiety   . Arthritis   . Asthma 2001  . COPD (chronic obstructive pulmonary disease) ([place]) 2013  . Depression   . Emphysema lung ([place])   . 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 Hiatal hernia   . Hypertension   . Insomnia   . Overactive bladder   . Pneumonia    2013  . Thyroid disease   . Vitamin D deficiency     Surgical History: Past Surgical History:  Procedure Laterality Date  . ANGIOPLASTY  2008, 2012  . CHOLECYSTECTOMY  1985  . COLONOSCOPY  2013  . GYNECOLOGIC CRYOSURGERY  1993  . SKIN BIOPSY  2006  . TUBAL LIGATION  1992  . UPPER GI ENDOSCOPY  2013   [facility]    Home Medications:  Allergies as of 06/14/2017      Reactions   Duloxetine Other (See Comments)   Agitation   Varenicline Other (See Comments)   Agitation   Duloxetine Hcl Anxiety   Agitation      Medication List        Accurate as of 06/14/17  4:22 PM. Always use your most recent med list.          PROAIR HFA 108 (90 Base) MCG/ACT inhaler Generic drug:  albuterol Inhale 2 puffs into the lungs every 6 (six) hours as needed for wheezing or shortness of breath.   albuterol (2.5 MG/3ML) 0.083% nebulizer solution Commonly known as:  PROVENTIL INHALE 1 VIAL USING NEBULIZER EVERY SIX HOURS AS NEEDED   arformoterol 15 MCG/2ML Nebu Commonly known as:  BROVANA Inhale into the lungs.  aspirin EC 81 MG tablet Take 81 mg by mouth every morning.   baclofen 10 MG tablet Commonly known as:  LIORESAL Take 10 mg by mouth 3 (three) times daily as needed for muscle spasms.   BD INTEGRA SYRINGE 25G X 1" 3 ML Misc Generic drug:  SYRINGE-NEEDLE (DISP) 3 ML USE WITH B12   budesonide 0.5 MG/2ML nebulizer solution Commonly known as:  PULMICORT TAKE 2 MLS (0.5 MG TOTAL) BY NEBULIZATION ONCE DAILY.   CHANTIX CONTINUING MONTH PAK 1 MG tablet Generic drug:  varenicline   clonazePAM 1 MG tablet Commonly known as:  KLONOPIN Take 1 mg by mouth 3 (three) times daily as needed for anxiety.   cyanocobalamin 1000 MCG/ML injection Commonly known as:  (VITAMIN B-12) Inject 1,000 mcg into the muscle every 30 (thirty) days. Use on Saturdays.   DALIRESP 500 MCG Tabs tablet Generic  drug:  roflumilast Take 500 mcg by mouth every evening.   DULoxetine 60 MG capsule Commonly known as:  CYMBALTA   estradiol 0.5 MG tablet Commonly known as:  ESTRACE Take 0.5 mg by mouth every morning. For 21 days. Do not take for 1 week, then start again.   fentaNYL 50 MCG/HR Commonly known as:  DURAGESIC - dosed mcg/hr APPLY 1 PATCH EVERY 72 HOURS. REMOVE OLD PATCH BEFORE APPLYING A NEW ONE.   FIORICET 50-325-40 MG tablet Generic drug:  butalbital-acetaminophen-caffeine Take 1 tablet by mouth every 4 (four) hours as needed for headache.   FLUoxetine 40 MG capsule Commonly known as:  PROZAC   furosemide 40 MG tablet Commonly known as:  LASIX Take 40 mg by mouth daily as needed for fluid or edema.   gabapentin 300 MG capsule Commonly known as:  NEURONTIN Take 300 mg by mouth every 6 (six) hours.   hydrochlorothiazide 25 MG tablet Commonly known as:  HYDRODIURIL Take 25 mg by mouth daily.   ketoconazole 2 % cream Commonly known as:  NIZORAL APPLY A SMALL AMOUNT TOPICALLY TWO TIMES A DAY   KLOR-CON M10 10 MEQ tablet Generic drug:  potassium chloride Take 10 mEq by mouth every morning.   levothyroxine 50 MCG tablet Commonly known as:  SYNTHROID, LEVOTHROID Take 50 mcg by mouth every morning.   losartan 50 MG tablet Commonly known as:  COZAAR Take 50 mg by mouth every morning.   medroxyPROGESTERone 2.5 MG tablet Commonly known as:  PROVERA Take 2.5 mg by mouth daily.   mirabegron ER 50 MG Tb24 tablet Commonly known as:  MYRBETRIQ Take 1 tablet (50 mg total) by mouth daily.   mometasone 0.1 % cream Commonly known as:  ELOCON Apply 1 application topically 2 (two) times daily.   nicotine 21 mg/24hr patch Commonly known as:  NICODERM CQ - dosed in mg/24 hours Place 1 patch (21 mg total) onto the skin daily.   nystatin powder Commonly known as:  MYCOSTATIN/NYSTOP APPLY SMALL AMOUNT TOPICALLY TWO TIMES A DAY AS NEEDED   ondansetron 4 MG disintegrating  tablet Commonly known as:  ZOFRAN-ODT   oxyCODONE-acetaminophen 7.5-325 MG tablet Commonly known as:  PERCOCET TAKE 1 TABLET BY MOUTH 4 TIMES A DAY FOR 31 DAYS   pantoprazole 40 MG tablet Commonly known as:  PROTONIX Take 40 mg by mouth daily.   tiotropium 18 MCG inhalation capsule Commonly known as:  SPIRIVA Place 18 mcg into inhaler and inhale daily.   valACYclovir 500 MG tablet Commonly known as:  VALTREX Take 500 mg by mouth daily.   VESICARE 10 MG tablet Generic  drug:  solifenacin Take 1 tablet (10 mg total) by mouth daily.   Vitamin D3 5000 units Caps Take 10,000 Units by mouth every morning.       Allergies:  Allergies  Allergen Reactions  . Duloxetine Other (See Comments)    Agitation  . Varenicline Other (See Comments)    Agitation  . Duloxetine Hcl Anxiety    Agitation    Family History: Family History  Problem Relation Age of Onset  . Cancer Mother        lung  . Cancer Father        lung  . Cancer Maternal Grandmother        breast  . Scoliosis Brother   . Arthritis Brother   . Uterine cancer Paternal Grandmother   . Bladder Cancer Neg Hx   . Kidney cancer Neg Hx     Social History:  reports that she has been smoking cigarettes.  She has a 35.00 pack-year smoking history. she has never used smokeless tobacco. She reports that she does not drink alcohol or use drugs.  ROS: UROLOGY Frequent Urination?: Yes Hard to postpone urination?: Yes Burning/pain with urination?: No Get up at night to urinate?: Yes Leakage of urine?: Yes Urine stream starts and stops?: No Trouble starting stream?: No Do you have to strain to urinate?: No Blood in urine?: No Urinary tract infection?: No Sexually transmitted disease?: No Injury to kidneys or bladder?: No Painful intercourse?: No Weak stream?: No Currently pregnant?: No Vaginal bleeding?: No Last menstrual period?: n  Gastrointestinal Nausea?: No Vomiting?: No Indigestion/heartburn?:  No Diarrhea?: Yes Constipation?: No  Constitutional Fever: No Night sweats?: Yes Weight loss?: No Fatigue?: Yes  Skin Skin rash/lesions?: No Itching?: No  Eyes Blurred vision?: No Double vision?: No  Ears/Nose/Throat Sore throat?: No Sinus problems?: Yes  Hematologic/Lymphatic Swollen glands?: No Easy bruising?: No  Cardiovascular Leg swelling?: Yes Chest pain?: No  Respiratory Cough?: Yes Shortness of breath?: Yes  Endocrine Excessive thirst?: Yes  Musculoskeletal Back pain?: Yes Joint pain?: Yes  Neurological Headaches?: Yes Dizziness?: No  Psychologic Depression?: Yes Anxiety?: Yes  Physical Exam: BP (!) 152/92 (BP Location: Right Arm, Patient Position: Sitting, Cuff Size: Normal)   Pulse 86   Ht 5\' 4"  (1.626 m)   Wt 203 lb (92.1 kg)   BMI 34.84 kg/m   Constitutional:  Alert and oriented, No acute distress.   Laboratory Data: Lab Results  Component Value Date   WBC 8.7 10/01/2016   HGB 14.9 10/01/2016   HCT 42.3 10/01/2016   MCV 90.2 10/01/2016   PLT 256 10/01/2016    Lab Results  Component Value Date   CREATININE 0.87 02/29/2016    No results found for: PSA  No results found for: TESTOSTERONE  No results found for: HGBA1C  Urinalysis    Component Value Date/Time   COLORURINE YELLOW (A) 10/01/2016 1540   APPEARANCEUR Clear 05/18/2017 1026   LABSPEC 1.008 10/01/2016 1540   LABSPEC 1.018 10/18/2012 1346   PHURINE 5.0 10/01/2016 1540   GLUCOSEU Negative 05/18/2017 1026   GLUCOSEU Negative 10/18/2012 1346   HGBUR NEGATIVE 10/01/2016 1540   BILIRUBINUR Negative 05/18/2017 1026   BILIRUBINUR Negative 10/18/2012 1346   KETONESUR NEGATIVE 10/01/2016 1540   PROTEINUR Negative 05/18/2017 1026   PROTEINUR NEGATIVE 10/01/2016 1540   NITRITE Negative 05/18/2017 1026   NITRITE NEGATIVE 10/01/2016 1540   LEUKOCYTESUR Negative 05/18/2017 1026   LEUKOCYTESUR Negative 10/18/2012 1346    Pertinent Imaging: None  Assessment &  Plan: The patient was given Myrbetriq 50 mg samples and prescription and we called in Vesicare 10 mg.  And for over 5 weeks I will perform a pelvic examination and perform cystoscopy especially if she is still leaking.  She is a smoker.  I mentioned a test and if she has not reached her treatment goal I will explain in order urodynamics.  A refractory overactive bladder therapy may be much more prudent to consider then a sling.  She remembered percutaneous tibial nerve stimulation being mentioned last visit  There are no diagnoses linked to this encounter.  No Follow-up on file.  Reece Packer, MD  [facility] [address], [place], [place] [address] [phone]

## 2017-06-16 ENCOUNTER — Other Ambulatory Visit: Payer: Self-pay | Admitting: Certified Nurse Midwife

## 2017-06-16 DIAGNOSIS — R232 Flushing: Secondary | ICD-10-CM

## 2017-06-16 DIAGNOSIS — N951 Menopausal and female climacteric states: Secondary | ICD-10-CM

## 2017-06-16 DIAGNOSIS — E079 Disorder of thyroid, unspecified: Secondary | ICD-10-CM

## 2017-06-16 NOTE — Progress Notes (Signed)
Orders placed for labs.   Philip Aspen, CNM

## 2017-06-17 ENCOUNTER — Other Ambulatory Visit: Payer: Medicare Other

## 2017-06-17 DIAGNOSIS — E079 Disorder of thyroid, unspecified: Secondary | ICD-10-CM

## 2017-06-17 DIAGNOSIS — R232 Flushing: Secondary | ICD-10-CM

## 2017-06-18 LAB — COMPREHENSIVE METABOLIC PANEL
A/G RATIO: 1.8 (ref 1.2–2.2)
ALBUMIN: 3.9 g/dL (ref 3.5–5.5)
ALT: 11 IU/L (ref 0–32)
AST: 13 IU/L (ref 0–40)
Alkaline Phosphatase: 69 IU/L (ref 39–117)
BILIRUBIN TOTAL: 0.3 mg/dL (ref 0.0–1.2)
BUN / CREAT RATIO: 5 — AB (ref 9–23)
BUN: 5 mg/dL — ABNORMAL LOW (ref 6–24)
CALCIUM: 9 mg/dL (ref 8.7–10.2)
CO2: 27 mmol/L (ref 20–29)
Chloride: 102 mmol/L (ref 96–106)
Creatinine, Ser: 0.93 mg/dL (ref 0.57–1.00)
GFR, EST AFRICAN AMERICAN: 82 mL/min/{1.73_m2} (ref >59)
GFR, EST NON AFRICAN AMERICAN: 71 mL/min/{1.73_m2} (ref >59)
Globulin, Total: 2.2 g/dL (ref 1.5–4.5)
Glucose: 149 mg/dL — ABNORMAL HIGH (ref 65–99)
POTASSIUM: 4.2 mmol/L (ref 3.5–5.2)
Sodium: 142 mmol/L (ref 134–144)
TOTAL PROTEIN: 6.1 g/dL (ref 6.0–8.5)

## 2017-06-18 LAB — TSH: TSH: 2.25 u[IU]/mL (ref 0.450–4.500)

## 2017-06-18 LAB — TESTOSTERONE: Testosterone: 19 ng/dL (ref 3–41)

## 2017-06-18 LAB — ESTRADIOL: Estradiol: <5 pg/mL

## 2017-07-15 ENCOUNTER — Emergency Department
Admission: EM | Admit: 2017-07-15 | Discharge: 2017-07-15 | Disposition: A | Discharge status: Home / Self Care | Payer: Medicare Other | Attending: Student in an Organized Health Care Education/Training Program | Admitting: Student in an Organized Health Care Education/Training Program

## 2017-07-15 ENCOUNTER — Encounter: Payer: Self-pay | Admitting: Intensive Care

## 2017-07-15 ENCOUNTER — Emergency Department: Payer: Medicare Other

## 2017-07-15 DIAGNOSIS — J45909 Unspecified asthma, uncomplicated: Secondary | ICD-10-CM | POA: Insufficient documentation

## 2017-07-15 DIAGNOSIS — F17210 Nicotine dependence, cigarettes, uncomplicated: Secondary | ICD-10-CM | POA: Diagnosis not present

## 2017-07-15 DIAGNOSIS — J9621 Acute and chronic respiratory failure with hypoxia: Secondary | ICD-10-CM | POA: Diagnosis not present

## 2017-07-15 DIAGNOSIS — R0602 Shortness of breath: Secondary | ICD-10-CM | POA: Diagnosis present

## 2017-07-15 DIAGNOSIS — J449 Chronic obstructive pulmonary disease, unspecified: Secondary | ICD-10-CM | POA: Diagnosis not present

## 2017-07-15 DIAGNOSIS — J441 Chronic obstructive pulmonary disease with (acute) exacerbation: Secondary | ICD-10-CM

## 2017-07-15 DIAGNOSIS — Z79899 Other long term (current) drug therapy: Secondary | ICD-10-CM | POA: Insufficient documentation

## 2017-07-15 LAB — CBC
HEMATOCRIT: 44.7 % (ref 35.0–47.0)
HEMOGLOBIN: 15.5 g/dL (ref 12.0–16.0)
MCH: 32.4 pg (ref 26.0–34.0)
MCHC: 34.6 g/dL (ref 32.0–36.0)
MCV: 93.6 fL (ref 80.0–100.0)
Platelets: 212 10*3/uL (ref 150–440)
RBC: 4.78 MIL/uL (ref 3.80–5.20)
RDW: 14.3 % (ref 11.5–14.5)
WBC: 7.4 10*3/uL (ref 3.6–11.0)

## 2017-07-15 LAB — INFLUENZA PANEL BY PCR (TYPE A & B)
INFLAPCR: NEGATIVE
INFLBPCR: NEGATIVE

## 2017-07-15 LAB — BASIC METABOLIC PANEL
ANION GAP: 8 (ref 5–15)
BUN: 6 mg/dL (ref 6–20)
CALCIUM: 9 mg/dL (ref 8.9–10.3)
CO2: 25 mmol/L (ref 22–32)
Chloride: 103 mmol/L (ref 101–111)
Creatinine, Ser: 0.77 mg/dL (ref 0.44–1.00)
GLUCOSE: 121 mg/dL — AB (ref 65–99)
POTASSIUM: 3.5 mmol/L (ref 3.5–5.1)
Sodium: 136 mmol/L (ref 135–145)

## 2017-07-15 LAB — TROPONIN I

## 2017-07-15 MED ORDER — PREDNISONE 20 MG PO TABS
60.0000 mg | ORAL_TABLET | Freq: Once | ORAL | Status: AC
  Administered 2017-07-15: 60 mg via ORAL
  Filled 2017-07-15: qty 3

## 2017-07-15 MED ORDER — IPRATROPIUM-ALBUTEROL 0.5-2.5 (3) MG/3ML IN SOLN
3.0000 mL | Freq: Once | RESPIRATORY_TRACT | Status: AC
  Administered 2017-07-15: 3 mL via RESPIRATORY_TRACT
  Filled 2017-07-15: qty 3

## 2017-07-15 MED ORDER — PREDNISONE 10 MG PO TABS: 10.0000 mg | ORAL_TABLET | Freq: Every day | ORAL | 0 refills | Status: DC

## 2017-07-15 MED ORDER — OXYCODONE-ACETAMINOPHEN 5-325 MG PO TABS
2.0000 | ORAL_TABLET | Freq: Once | ORAL | Status: AC
  Administered 2017-07-15: 2 via ORAL
  Filled 2017-07-15: qty 2

## 2017-07-15 NOTE — ED Triage Notes (Signed)
Patient presents today with SOB X2 days. HX COPD. Wears 2L O2 at night. Also c/o generalized body aches, low grade fevers, and congestion. Room Air sats reading at 90% in triage.

## 2017-07-15 NOTE — ED Provider Notes (Signed)
[facility] Emergency Department Provider Note    First MD Initiated Contact with Patient 07/15/17 1705     (approximate)  I have reviewed the triage vital signs and the nursing notes.   HISTORY  Chief Complaint Shortness of Breath    HPI Bonnie Gomez is a 51 y.o. female history of COPD wears 2 L nasal cannula oxygen at night presents with worsening shortness of breath and increased O2 requirement requiring multiple nebulizer treatments over the past day or so.  States she is also had a nonproductive cough.  No fevers.  But has had some chills and body aches.  Also feels that she has some chest congestion.  Denies any nausea or vomiting.  No lower extremity swelling.  No pleuritic chest pain.  She did get her flu vaccine this year.  Past Medical History:  Diagnosis Date  . Allergy   . Anxiety   . Arthritis   . Asthma 2001  . COPD (chronic obstructive pulmonary disease) ([place]) 2013  . Depression   . Emphysema lung ([place])   . 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 Hiatal hernia   . Hypertension   . Insomnia   . Overactive bladder   . Pneumonia    2013  . Thyroid disease   . Vitamin D deficiency    Family History  Problem Relation Age of Onset  . Cancer Mother        lung  . Cancer Father        lung  . Cancer Maternal Grandmother        breast  . Scoliosis Brother   . Arthritis Brother   . Uterine cancer Paternal Grandmother   . Bladder Cancer Neg Hx   . Kidney cancer Neg Hx    Past Surgical History:  Procedure Laterality Date  . ANGIOPLASTY  2008, 2012  . CHOLECYSTECTOMY  1985  . COLONOSCOPY  2013  . GYNECOLOGIC CRYOSURGERY  1993  . SKIN BIOPSY  2006  . TUBAL LIGATION  1992  . UPPER GI ENDOSCOPY  2013   [place]   Patient Active Problem List   Diagnosis Date Noted  . Post-menopausal 05/03/2017  . COPD exacerbation ([place]) 02/29/2016  . Condyloma acuminatum in female 04/29/2015  . Perineal mass in female  04/11/2015  . Anxiety 12/05/2013  . Sleep apnea 12/05/2013  . Migraine 12/05/2013      Prior to Admission medications   Medication Sig Start Date End Date Taking? Authorizing Provider  albuterol (PROAIR HFA) 108 (90 BASE) MCG/ACT inhaler Inhale 2 puffs into the lungs every 6 (six) hours as needed for wheezing or shortness of breath.     [provider]  albuterol (PROVENTIL) (2.5 MG/3ML) 0.083% nebulizer solution INHALE 1 VIAL USING NEBULIZER EVERY SIX HOURS AS NEEDED 04/03/14   [provider]  arformoterol (BROVANA) 15 MCG/2ML NEBU Inhale into the lungs. 02/17/17   [provider]  aspirin EC 81 MG tablet Take 81 mg by mouth every morning.     [provider]  baclofen (LIORESAL) 10 MG tablet Take 10 mg by mouth 3 (three) times daily as needed for muscle spasms.    [provider]  BD INTEGRA SYRINGE 25G X 1" 3 ML MISC USE WITH B12 01/09/15   [provider]  budesonide (PULMICORT) 0.5 MG/2ML nebulizer solution TAKE 2 MLS (0.5 MG TOTAL) BY NEBULIZATION ONCE DAILY. 04/18/17   [provider]  butalbital-acetaminophen-caffeine Emelda Brothers)  50-325-40 MG tablet Take 1 tablet by mouth every 4 (four) hours as needed for headache.    [provider]  CHANTIX CONTINUING MONTH PAK 1 MG tablet  09/21/16   [provider]  Cholecalciferol (VITAMIN D3) 5000 units CAPS Take 10,000 Units by mouth every morning.    [provider]  clonazePAM (KLONOPIN) 1 MG tablet Take 1 mg by mouth 3 (three) times daily as needed for anxiety.     [provider]  cyanocobalamin (,VITAMIN B-12,) 1000 MCG/ML injection Inject 1,000 mcg into the muscle every 30 (thirty) days. Use on Saturdays.     [provider]  DALIRESP 500 MCG TABS tablet Take 500 mcg by mouth every evening.  01/10/16   [provider]  DULoxetine (CYMBALTA) 60 MG capsule  09/21/16   [provider]  estradiol (ESTRACE) 0.5 MG tablet Take  0.5 mg by mouth every morning. For 21 days. Do not take for 1 week, then start again.    [provider]  fentaNYL (DURAGESIC - DOSED MCG/HR) 50 MCG/HR APPLY 1 PATCH EVERY 72 HOURS. REMOVE OLD PATCH BEFORE APPLYING A NEW ONE. 09/10/16   [provider]  FLUoxetine (PROZAC) 40 MG capsule  09/25/16   [provider]  furosemide (LASIX) 40 MG tablet Take 40 mg by mouth daily as needed for fluid or edema.  04/07/15   [provider]  gabapentin (NEURONTIN) 300 MG capsule Take 300 mg by mouth every 6 (six) hours.  03/21/15   [provider]  hydrochlorothiazide (HYDRODIURIL) 25 MG tablet Take 25 mg by mouth daily. 12/30/15   [provider]  ketoconazole (NIZORAL) 2 % cream APPLY A SMALL AMOUNT TOPICALLY TWO TIMES A DAY 04/08/15   [provider]  levothyroxine (SYNTHROID, LEVOTHROID) 50 MCG tablet Take 50 mcg by mouth every morning. 03/21/15   [provider]  losartan (COZAAR) 50 MG tablet Take 50 mg by mouth every morning.    [provider]  medroxyPROGESTERone (PROVERA) 2.5 MG tablet Take 2.5 mg by mouth daily. 02/04/17   [provider]  mirabegron ER (MYRBETRIQ) 50 MG TB24 tablet Take 1 tablet (50 mg total) by mouth daily. 06/14/17   MacDiarmid, Nicki Reaper, MD  mometasone (ELOCON) 0.1 % cream Apply 1 application topically 2 (two) times daily.    [provider]  nicotine (NICODERM CQ - DOSED IN MG/24 HOURS) 21 mg/24hr patch Place 1 patch (21 mg total) onto the skin daily. Patient not taking: Reported on 06/14/2017 03/01/16   Bettey Costa, MD  nystatin (MYCOSTATIN/NYSTOP) powder APPLY SMALL AMOUNT TOPICALLY TWO TIMES A DAY AS NEEDED 02/23/17   [provider]  ondansetron (ZOFRAN-ODT) 4 MG disintegrating tablet  04/27/17   [provider]  oxyCODONE-acetaminophen (PERCOCET) 7.5-325 MG tablet TAKE 1 TABLET BY MOUTH 4 TIMES A DAY FOR 31 DAYS 09/21/16   [provider]  pantoprazole (PROTONIX) 40 MG  tablet Take 40 mg by mouth daily. 04/07/17   [provider]  potassium chloride (KLOR-CON M10) 10 MEQ tablet Take 10 mEq by mouth every morning.    [provider]  predniSONE (DELTASONE) 10 MG tablet Take 1 tablet (10 mg total) by mouth daily. Day 1-2: Take 50 mg  ( 5 pills) Day 3-4 : Take 40 mg (4pills) Day 5-6: Take 30 mg (3 pills) Day 7-8:  Take 20 mg (2 pills) Day 9:  Take 10mg  (1 pill) 07/15/17   Merlyn Lot, MD  solifenacin (VESICARE) 10 MG tablet Take  1 tablet (10 mg total) by mouth daily. 06/14/17   Bjorn Loser, MD  tiotropium (SPIRIVA) 18 MCG inhalation capsule Place 18 mcg into inhaler and inhale daily.     [provider]  valACYclovir (VALTREX) 500 MG tablet Take 500 mg by mouth daily. 01/09/16   [provider]    Allergies Duloxetine; Varenicline; and Duloxetine hcl    Social History Social History   Tobacco Use  . Smoking status: Current Every Day Smoker    Packs/day: 1.00    Years: 35.00    Pack years: 35.00    Types: Cigarettes  . Smokeless tobacco: Never Used  . Tobacco comment: "not now "per pt  Substance Use Topics  . Alcohol use: No    Alcohol/week: 0.0 oz  . Drug use: No    Review of Systems Patient denies headaches, rhinorrhea, blurry vision, numbness, shortness of breath, chest pain, edema, cough, abdominal pain, nausea, vomiting, diarrhea, dysuria, fevers, rashes or hallucinations unless otherwise stated above in HPI. ____________________________________________   PHYSICAL EXAM:  VITAL SIGNS: Vitals:   07/15/17 2100 07/15/17 2205  BP: (!) 144/73 125/64  Pulse: (!) 101 97  Resp:  18  Temp:    SpO2: 97% 95%    Constitutional: Alert and oriented. in no acute distress. Eyes: Conjunctivae are normal.  Head: Atraumatic. Nose: No congestion/rhinnorhea. Mouth/Throat: Mucous membranes are moist.   Neck: No stridor. Painless ROM.  Cardiovascular: Normal rate, regular rhythm. Grossly normal heart  sounds.  Good peripheral circulation. Respiratory: mild tachypnea with prolonged expiratory phase with diffuse expiratory wheeze,  Gastrointestinal: Soft and nontender. No distention. No abdominal bruits. No CVA tenderness. Genitourinary:  Musculoskeletal: No lower extremity tenderness nor edema.  No joint effusions. Neurologic:  Normal speech and language. No gross focal neurologic deficits are appreciated. No facial droop Skin:  Skin is warm, dry and intact. No rash noted. Psychiatric: Mood and affect are normal. Speech and behavior are normal.  ____________________________________________   LABS (all labs ordered are listed, but only abnormal results are displayed)  Results for orders placed or performed during the hospital encounter of 07/15/17 (from the past 24 hour(s))  Basic metabolic panel     Status: Abnormal   Collection Time: 07/15/17  3:09 PM  Result Value Ref Range   Sodium 136 135 - 145 mmol/L   Potassium 3.5 3.5 - 5.1 mmol/L   Chloride 103 101 - 111 mmol/L   CO2 25 22 - 32 mmol/L   Glucose, Bld 121 (H) 65 - 99 mg/dL   BUN 6 6 - 20 mg/dL   Creatinine, Ser 0.77 0.44 - 1.00 mg/dL   Calcium 9.0 8.9 - 10.3 mg/dL   GFR calc non Af Amer >60 >60 mL/min   GFR calc Af Amer >60 >60 mL/min   Anion gap 8 5 - 15  CBC     Status: None   Collection Time: 07/15/17  3:09 PM  Result Value Ref Range   WBC 7.4 3.6 - 11.0 K/uL   RBC 4.78 3.80 - 5.20 MIL/uL   Hemoglobin 15.5 12.0 - 16.0 g/dL   HCT 44.7 35.0 - 47.0 %   MCV 93.6 80.0 - 100.0 fL   MCH 32.4 26.0 - 34.0 pg   MCHC 34.6 32.0 - 36.0 g/dL   RDW 14.3 11.5 - 14.5 %   Platelets 212 150 - 440 K/uL  Troponin I     Status: None   Collection Time: 07/15/17  3:09 PM  Result Value Ref Range  Troponin I <0.03 <0.03 ng/mL  Influenza panel by PCR (type A & B)     Status: None   Collection Time: 07/15/17  5:28 PM  Result Value Ref Range   Influenza A By PCR NEGATIVE NEGATIVE   Influenza B By PCR NEGATIVE NEGATIVE    ____________________________________________  EKG My review and personal interpretation at Time: 15:16   Indication: sob  Rate: 85  Rhythm: sinus Axis: nromal Other: no stemi, poor rwave progression, normal intervals ____________________________________________  RADIOLOGY  I personally reviewed all radiographic images ordered to evaluate for the above acute complaints and reviewed radiology reports and findings.  These findings were personally discussed with the patient.  Please see medical record for radiology report.  ____________________________________________   PROCEDURES  Procedure(s) performed:  Procedures    Critical Care performed: no ____________________________________________   INITIAL IMPRESSION / ASSESSMENT AND PLAN / ED COURSE  Pertinent labs & imaging results that were available during my care of the patient were reviewed by me and considered in my medical decision making (see chart for details).  DDX: Asthma, copd, CHF, pna, ptx, malignancy, Pe, anemia   GAYATHRI FUTRELL is a 51 y.o. who presents to the ED with history of COPD with chronic O2 requirement presents with worsening shortness of breath.  No evidence of acute respiratory distress.  Does have significant wheezing on exam concerning for COPD exacerbation.  Blood work is reassuring.  No evidence of heart failure no evidence of consolidation or pneumonia.  Will treat for COPD exacerbation with nebulizers and prednisone and reassess.  Clinical Course as of Jul 15 2225  Thu Jul 15, 2017  2000 Patient no acute distress.  Still with some wheezing on exam.  States that the breathing does feel like is improving but patient noted to still have low O2 saturations on 2 L of cannula.  No respiratory distress.  States that she is also having trouble sitting still due to chronic back pain.  Will give her home medications.  [PR]  2148 Patient reassessed and states that she feels significantly better and feels  like the steroids have kicked in.  Patient was able to ambulate on her home O2 with no respiratory distress with O2 saturations remaining at 94%.  Patient states that she feels good enough to go home.  I did recommend further observation in the hospital patient states that she feels comfortable and that her symptoms have significantly resolved and she has access to nebulizers as well as oxygen at home.  Has had similar episodes in the past that responded well to steroids and she would prefer to be managed as an outpatient.  She remains stable with no respiratory distress and has significantly improved with steroids believe that this is a reasonable option.  Have discussed with the patient and available family all diagnostics and treatments performed thus far and all questions were answered to the best of my ability. The patient demonstrates understanding and agreement with plan.   [PR]    Clinical Course User Index [PR] Merlyn Lot, MD     ____________________________________________   FINAL CLINICAL IMPRESSION(S) / ED DIAGNOSES  Final diagnoses:  Acute on chronic respiratory failure with hypoxia ([place])  COPD exacerbation ([place])      NEW MEDICATIONS STARTED DURING THIS VISIT:  This SmartLink is deprecated. Use AVSMEDLIST instead to display the medication list for a patient.   Note:  This document was prepared using Dragon voice recognition software and may include unintentional dictation errors.  Merlyn Lot, MD 07/15/17 2227

## 2017-07-15 NOTE — ED Notes (Signed)
Ambulated pt around on the floor. Pt stayed at 94% on 2L. Pt stated that she felt "fine".

## 2017-07-15 NOTE — ED Notes (Signed)
Patient placed on 2L O2 due to room air sats 89%

## 2017-07-26 ENCOUNTER — Ambulatory Visit: Payer: Medicare Other | Admitting: Urology

## 2017-07-26 ENCOUNTER — Encounter: Payer: Self-pay | Admitting: Urology

## 2017-07-26 VITALS — BP 125/73 | HR 93 | Ht 65.0 in | Wt 191.0 lb

## 2017-07-26 DIAGNOSIS — N3946 Mixed incontinence: Secondary | ICD-10-CM

## 2017-07-26 LAB — MICROSCOPIC EXAMINATION: RBC, UA: NONE SEEN /hpf (ref 0–?)

## 2017-07-26 LAB — URINALYSIS, COMPLETE
Bilirubin, UA: NEGATIVE
GLUCOSE, UA: NEGATIVE
KETONES UA: NEGATIVE
LEUKOCYTES UA: NEGATIVE
Nitrite, UA: NEGATIVE
PROTEIN UA: NEGATIVE
RBC, UA: NEGATIVE
SPEC GRAV UA: 1.025 (ref 1.005–1.030)
Urobilinogen, Ur: 0.2 mg/dL (ref 0.2–1.0)
pH, UA: 5.5 (ref 5.0–7.5)

## 2017-07-26 MED ORDER — LIDOCAINE HCL 2 % EX GEL
1.0000 "application " | Freq: Once | CUTANEOUS | Status: AC
  Administered 2017-07-26: 1 via URETHRAL

## 2017-07-26 MED ORDER — CIPROFLOXACIN HCL 500 MG PO TABS
500.0000 mg | ORAL_TABLET | Freq: Once | ORAL | Status: AC
  Administered 2017-07-26: 500 mg via ORAL

## 2017-07-26 NOTE — Progress Notes (Signed)
07/26/2017 3:53 PM   Bonnie Gomez 12/10/1965 [id number]  Referring provider: Remi Haggard, [address] [place], [place] [address]  Chief Complaint  Patient presents with  . Cysto    HPI: Dr Alvis Lemmings:   Today I repeated the history today.  For many years she leaks with coughing and sneezing and only if her bladder is full with bending and lifting.  She reports that she can live with this.  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  Now she wears 2 pads during the day and 1 at night.  She is about 60% better on a combination of Vesicare and Myrbetriq.  She actually took both of them together.  She voids 2-5 times a night and does have ankle edema and takes a diuretic.  She voids frequently during the day and cannot hold it for 2 hours.  Rarely she has high-volume flooding episodes like she did prior on Vesicare monotherapy    The patient was given Myrbetriq 50 mg samples and prescription and we called in Vesicare 10 mg.  And for over 5 weeks I will perform a pelvic examination and perform cystoscopy especially if she is still leaking.  She is a smoker.  I mentioned a test and if she has not reached her treatment goal I will explain in order urodynamics.  A refractory overactive bladder therapy may be much more prudent to consider then a sling.  She remembered percutaneous tibial nerve stimulation being mentioned last visit  Today The patient still has incontinence but she says he 60 or 70% improve.  She is no longer bathroom mapping.  She reports that she can live with her stress incontinence.  It appears that she is on Vesicare but she is also on Myrbetriq 50 mg.  I believe she is on the 10 mg but she was not certain  Cystoscopy: The patient underwent possible cystoscopy after consent.  Sterile technique utilized.  Bladder mucosa and trigone were normal.  There is no cystitis.  There is no carcinoma.    She had  a negative hop test associate grade 2 hypomobility following the cystoscopy   PMH: Past Medical History:  Diagnosis Date  . Allergy   . Anxiety   . Arthritis   . Asthma 2001  . COPD (chronic obstructive pulmonary disease) ([place]) 2013  . Depression   . Emphysema lung ([place])   . 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 Hiatal hernia   . Hypertension   . Insomnia   . Overactive bladder   . Pneumonia    2013  . Thyroid disease   . Vitamin D deficiency     Surgical History: Past Surgical History:  Procedure Laterality Date  . ANGIOPLASTY  2008, 2012  . CHOLECYSTECTOMY  1985  . COLONOSCOPY  2013  . GYNECOLOGIC CRYOSURGERY  1993  . SKIN BIOPSY  2006  . TUBAL LIGATION  1992  . UPPER GI ENDOSCOPY  2013   [place]    Home Medications:  Allergies as of 07/26/2017      Reactions   Duloxetine Other (See Comments)   Agitation   Varenicline Other (See Comments)   Agitation   Duloxetine Hcl Anxiety   Agitation      Medication List        Accurate as of 07/26/17  3:53 PM. Always use your most recent med list.  PROAIR HFA 108 (90 Base) MCG/ACT inhaler Generic drug:  albuterol Inhale 2 puffs into the lungs every 6 (six) hours as needed for wheezing or shortness of breath.   albuterol (2.5 MG/3ML) 0.083% nebulizer solution Commonly known as:  PROVENTIL INHALE 1 VIAL USING NEBULIZER EVERY SIX HOURS AS NEEDED   arformoterol 15 MCG/2ML Nebu Commonly known as:  BROVANA Inhale into the lungs.   aspirin EC 81 MG tablet Take 81 mg by mouth every morning.   baclofen 10 MG tablet Commonly known as:  LIORESAL Take 10 mg by mouth 3 (three) times daily as needed for muscle spasms.   BD INTEGRA SYRINGE 25G X 1" 3 ML Misc Generic drug:  SYRINGE-NEEDLE (DISP) 3 ML USE WITH B12   budesonide 0.5 MG/2ML nebulizer solution Commonly known as:  PULMICORT TAKE 2 MLS (0.5 MG TOTAL) BY NEBULIZATION ONCE DAILY.   CHANTIX CONTINUING MONTH PAK 1 MG tablet Generic  drug:  varenicline   clonazePAM 1 MG tablet Commonly known as:  KLONOPIN Take 1 mg by mouth 3 (three) times daily as needed for anxiety.   cyanocobalamin 1000 MCG/ML injection Commonly known as:  (VITAMIN B-12) Inject 1,000 mcg into the muscle every 30 (thirty) days. Use on Saturdays.   DALIRESP 500 MCG Tabs tablet Generic drug:  roflumilast Take 500 mcg by mouth every evening.   DULoxetine 60 MG capsule Commonly known as:  CYMBALTA   estradiol 0.5 MG tablet Commonly known as:  ESTRACE Take 0.5 mg by mouth every morning. For 21 days. Do not take for 1 week, then start again.   fentaNYL 50 MCG/HR Commonly known as:  DURAGESIC - dosed mcg/hr APPLY 1 PATCH EVERY 72 HOURS. REMOVE OLD PATCH BEFORE APPLYING A NEW ONE.   FIORICET 50-325-40 MG tablet Generic drug:  butalbital-acetaminophen-caffeine Take 1 tablet by mouth every 4 (four) hours as needed for headache.   FLUoxetine 40 MG capsule Commonly known as:  PROZAC   furosemide 40 MG tablet Commonly known as:  LASIX Take 40 mg by mouth daily as needed for fluid or edema.   gabapentin 300 MG capsule Commonly known as:  NEURONTIN Take 300 mg by mouth every 6 (six) hours.   hydrochlorothiazide 25 MG tablet Commonly known as:  HYDRODIURIL Take 25 mg by mouth daily.   ketoconazole 2 % cream Commonly known as:  NIZORAL APPLY A SMALL AMOUNT TOPICALLY TWO TIMES A DAY   KLOR-CON M10 10 MEQ tablet Generic drug:  potassium chloride Take 10 mEq by mouth every morning.   levothyroxine 50 MCG tablet Commonly known as:  SYNTHROID, LEVOTHROID Take 50 mcg by mouth every morning.   losartan 50 MG tablet Commonly known as:  COZAAR Take 50 mg by mouth every morning.   medroxyPROGESTERone 2.5 MG tablet Commonly known as:  PROVERA Take 2.5 mg by mouth daily.   mirabegron ER 50 MG Tb24 tablet Commonly known as:  MYRBETRIQ Take 1 tablet (50 mg total) by mouth daily.   mometasone 0.1 % cream Commonly known as:  ELOCON Apply 1  application topically 2 (two) times daily.   nicotine 21 mg/24hr patch Commonly known as:  NICODERM CQ - dosed in mg/24 hours Place 1 patch (21 mg total) onto the skin daily.   nystatin powder Commonly known as:  MYCOSTATIN/NYSTOP APPLY SMALL AMOUNT TOPICALLY TWO TIMES A DAY AS NEEDED   ondansetron 4 MG disintegrating tablet Commonly known as:  ZOFRAN-ODT   oxyCODONE-acetaminophen 7.5-325 MG tablet Commonly known as:  PERCOCET TAKE 1 TABLET BY  MOUTH 4 TIMES A DAY FOR 31 DAYS   pantoprazole 40 MG tablet Commonly known as:  PROTONIX Take 40 mg by mouth daily.   predniSONE 10 MG tablet Commonly known as:  DELTASONE Take 1 tablet (10 mg total) by mouth daily. Day 1-2: Take 50 mg  ( 5 pills) Day 3-4 : Take 40 mg (4pills) Day 5-6: Take 30 mg (3 pills) Day 7-8:  Take 20 mg (2 pills) Day 9:  Take 10mg  (1 pill)   solifenacin 10 MG tablet Commonly known as:  VESICARE Take 1 tablet (10 mg total) by mouth daily.   tiotropium 18 MCG inhalation capsule Commonly known as:  SPIRIVA Place 18 mcg into inhaler and inhale daily.   valACYclovir 500 MG tablet Commonly known as:  VALTREX Take 500 mg by mouth daily.   Vitamin D3 5000 units Caps Take 10,000 Units by mouth every morning.       Allergies:  Allergies  Allergen Reactions  . Duloxetine Other (See Comments)    Agitation  . Varenicline Other (See Comments)    Agitation  . Duloxetine Hcl Anxiety    Agitation    Family History: Family History  Problem Relation Age of Onset  . Cancer Mother        lung  . Cancer Father        lung  . Cancer Maternal Grandmother        breast  . Scoliosis Brother   . Arthritis Brother   . Uterine cancer Paternal Grandmother   . Bladder Cancer Neg Hx   . Kidney cancer Neg Hx     Social History:  reports that she has been smoking cigarettes.  She has a 35.00 pack-year smoking history. she has never used smokeless tobacco. She reports that she does not drink alcohol or use  drugs.  ROS:                                        Physical Exam: BP 125/73   Pulse 93   Ht 5\' 5"  (1.651 m)   Wt 191 lb (86.6 kg)   BMI 31.78 kg/m   Constitutional:  Alert and oriented, No acute distress.  Laboratory Data: Lab Results  Component Value Date   WBC 7.4 07/15/2017   HGB 15.5 07/15/2017   HCT 44.7 07/15/2017   MCV 93.6 07/15/2017   PLT 212 07/15/2017    Lab Results  Component Value Date   CREATININE 0.77 07/15/2017    No results found for: PSA  Lab Results  Component Value Date   TESTOSTERONE 19 06/17/2017    No results found for: HGBA1C  Urinalysis    Component Value Date/Time   COLORURINE YELLOW (A) 10/01/2016 1540   APPEARANCEUR Clear 05/18/2017 1026   LABSPEC 1.008 10/01/2016 1540   LABSPEC 1.018 10/18/2012 1346   PHURINE 5.0 10/01/2016 1540   GLUCOSEU Negative 05/18/2017 1026   GLUCOSEU Negative 10/18/2012 1346   HGBUR NEGATIVE 10/01/2016 1540   BILIRUBINUR Negative 05/18/2017 1026   BILIRUBINUR Negative 10/18/2012 1346   KETONESUR NEGATIVE 10/01/2016 1540   PROTEINUR Negative 05/18/2017 1026   PROTEINUR NEGATIVE 10/01/2016 1540   NITRITE Negative 05/18/2017 1026   NITRITE NEGATIVE 10/01/2016 1540   LEUKOCYTESUR Negative 05/18/2017 1026   LEUKOCYTESUR Negative 10/18/2012 1346    Pertinent Imaging:   Assessment & Plan: The patient has mixed incontinence with some improvement on double  therapy.  Reassess on samples of Toviaz and Myrbetriq.  Once again the bathroom mapping is what is bothering her the most.  She did leak quite a bit when she coughs with smoking but this is not the primary complaint  1. Mixed stress and urge urinary incontinence 2.  Frequency - Urinalysis, Complete - ciprofloxacin (CIPRO) tablet 500 mg - lidocaine (XYLOCAINE) 2 % jelly 1 application   No Follow-up on file.  Reece Packer, MD  [facility] [address], [place], [place]  [address] [phone]

## 2017-07-28 ENCOUNTER — Telehealth: Payer: Self-pay | Admitting: Certified Nurse Midwife

## 2017-07-28 NOTE — Telephone Encounter (Signed)
Patient called stating she is supposed to be on a medication. She sated after her labwork done in November the dosage would be determined. She would like a call back.Thanks

## 2017-07-31 NOTE — Telephone Encounter (Signed)
She may continue regimen of estradiol 21 days on and 7 days off with daily progesterone or she may start Prempro 0.625mg /2.5mg  daily. Tell her sorry for the delayed response her labs were not routed to me. Thanks, JML

## 2017-08-02 ENCOUNTER — Telehealth: Payer: Self-pay

## 2017-08-02 NOTE — Telephone Encounter (Signed)
Voicemail message left with providers instructions.

## 2017-08-03 ENCOUNTER — Telehealth: Payer: Self-pay | Admitting: Certified Nurse Midwife

## 2017-08-03 ENCOUNTER — Other Ambulatory Visit: Payer: Self-pay

## 2017-08-03 MED ORDER — ESTRADIOL 0.5 MG PO TABS: 0.5000 mg | ORAL_TABLET | ORAL | 1 refills | Status: DC

## 2017-08-03 NOTE — Telephone Encounter (Signed)
Message sent to [facility].

## 2017-08-03 NOTE — Telephone Encounter (Signed)
The patient called and stated that she would like a call back to have a nurse or a provider go over her most recent results. And also go over what medication she should be taking. No other information was disclosed. Please advise.

## 2017-08-05 ENCOUNTER — Other Ambulatory Visit: Payer: Self-pay

## 2017-08-05 MED ORDER — MEDROXYPROGESTERONE ACETATE 2.5 MG PO TABS: 2.5000 mg | ORAL_TABLET | Freq: Every day | ORAL | 2 refills | Status: DC

## 2017-08-23 ENCOUNTER — Ambulatory Visit: Payer: Medicare Other | Admitting: Urology

## 2017-08-30 ENCOUNTER — Ambulatory Visit (INDEPENDENT_AMBULATORY_CARE_PROVIDER_SITE_OTHER): Payer: Medicare Other | Admitting: Urology

## 2017-08-30 ENCOUNTER — Encounter: Payer: Self-pay | Admitting: Urology

## 2017-08-30 VITALS — BP 118/90 | HR 93 | Ht 64.0 in | Wt 191.0 lb

## 2017-08-30 DIAGNOSIS — N3946 Mixed incontinence: Secondary | ICD-10-CM | POA: Diagnosis not present

## 2017-08-30 MED ORDER — FESOTERODINE FUMARATE ER 8 MG PO TB24: 8.0000 mg | ORAL_TABLET | Freq: Every day | ORAL | 11 refills | Status: DC

## 2017-08-30 NOTE — Progress Notes (Signed)
08/30/2017 4:32 PM   Bonnie Gomez [birth year] [id number]  Referring provider: Remi Haggard, [place], [place] [address]  Chief Complaint  Patient presents with  . Follow-up    HPI: Reviewed my last history in detail.  Mixed incontinence much improved.  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  Clinically not infected.  Frequency stable     PMH: Past Medical History:  Diagnosis Date  . Allergy   . Anxiety   . Arthritis   . Asthma 2001  . COPD (chronic obstructive pulmonary disease) ([place]) 2013  . Depression   . Emphysema lung ([place])   . 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 Hiatal hernia   . Hypertension   . Insomnia   . Overactive bladder   . Pneumonia    2013  . Thyroid disease   . Vitamin D deficiency     Surgical History: Past Surgical History:  Procedure Laterality Date  . ANGIOPLASTY  2008, 2012  . CHOLECYSTECTOMY  1985  . COLONOSCOPY  2013  . GYNECOLOGIC CRYOSURGERY  1993  . SKIN BIOPSY  2006  . TUBAL LIGATION  1992  . UPPER GI ENDOSCOPY  2013   [facility]    Home Medications:  Allergies as of 08/30/2017      Reactions   Duloxetine Other (See Comments)   Agitation   Varenicline Other (See Comments)   Agitation   Duloxetine Hcl Anxiety   Agitation      Medication List        Accurate as of 08/30/17  4:32 PM. Always use your most recent med list.          PROAIR HFA 108 (90 Base) MCG/ACT inhaler Generic drug:  albuterol Inhale 2 puffs into the lungs every 6 (six) hours as needed for wheezing or shortness of breath.   albuterol (2.5 MG/3ML) 0.083% nebulizer solution Commonly known as:  PROVENTIL INHALE 1 VIAL USING NEBULIZER EVERY SIX HOURS AS NEEDED   arformoterol 15 MCG/2ML Nebu Commonly known as:  BROVANA Inhale into the lungs.   aspirin EC 81 MG tablet Take 81 mg by mouth every morning.   baclofen 10 MG tablet Commonly known as:  LIORESAL Take 10 mg by mouth 3 (three) times daily as  needed for muscle spasms.   BD INTEGRA SYRINGE 25G X 1" 3 ML Misc Generic drug:  SYRINGE-NEEDLE (DISP) 3 ML USE WITH B12   budesonide 0.5 MG/2ML nebulizer solution Commonly known as:  PULMICORT TAKE 2 MLS (0.5 MG TOTAL) BY NEBULIZATION ONCE DAILY.   CHANTIX CONTINUING MONTH PAK 1 MG tablet Generic drug:  varenicline   clonazePAM 1 MG tablet Commonly known as:  KLONOPIN Take 1 mg by mouth 3 (three) times daily as needed for anxiety.   cyanocobalamin 1000 MCG/ML injection Commonly known as:  (VITAMIN B-12) Inject 1,000 mcg into the muscle every 30 (thirty) days. Use on Saturdays.   DALIRESP 500 MCG Tabs tablet Generic drug:  roflumilast Take 500 mcg by mouth every evening.   DULoxetine 60 MG capsule Commonly known as:  CYMBALTA   estradiol 0.5 MG tablet Commonly known as:  ESTRACE Take 1 tablet (0.5 mg total) by mouth every morning. For 21 days. Do not take for 1 week, then start again.   fentaNYL 50 MCG/HR Commonly known as:  DURAGESIC - dosed mcg/hr APPLY 1 PATCH EVERY 72 HOURS. REMOVE OLD PATCH BEFORE APPLYING A NEW ONE.   FIORICET 50-325-40 MG  tablet Generic drug:  butalbital-acetaminophen-caffeine Take 1 tablet by mouth every 4 (four) hours as needed for headache.   FLUoxetine 40 MG capsule Commonly known as:  PROZAC   furosemide 40 MG tablet Commonly known as:  LASIX Take 40 mg by mouth daily as needed for fluid or edema.   gabapentin 300 MG capsule Commonly known as:  NEURONTIN Take 300 mg by mouth every 6 (six) hours.   hydrochlorothiazide 25 MG tablet Commonly known as:  HYDRODIURIL Take 25 mg by mouth daily.   ketoconazole 2 % cream Commonly known as:  NIZORAL APPLY A SMALL AMOUNT TOPICALLY TWO TIMES A DAY   KLOR-CON M10 10 MEQ tablet Generic drug:  potassium chloride Take 10 mEq by mouth every morning.   levothyroxine 50 MCG tablet Commonly known as:  SYNTHROID, LEVOTHROID Take 50 mcg by mouth every morning.   losartan 50 MG tablet Commonly  known as:  COZAAR Take 50 mg by mouth every morning.   medroxyPROGESTERone 2.5 MG tablet Commonly known as:  PROVERA Take 1 tablet (2.5 mg total) by mouth daily.   mirabegron ER 50 MG Tb24 tablet Commonly known as:  MYRBETRIQ Take 1 tablet (50 mg total) by mouth daily.   mometasone 0.1 % cream Commonly known as:  ELOCON Apply 1 application topically 2 (two) times daily.   nicotine 21 mg/24hr patch Commonly known as:  NICODERM CQ - dosed in mg/24 hours Place 1 patch (21 mg total) onto the skin daily.   nystatin powder Commonly known as:  MYCOSTATIN/NYSTOP APPLY SMALL AMOUNT TOPICALLY TWO TIMES A DAY AS NEEDED   ondansetron 4 MG disintegrating tablet Commonly known as:  ZOFRAN-ODT   oxyCODONE-acetaminophen 7.5-325 MG tablet Commonly known as:  PERCOCET TAKE 1 TABLET BY MOUTH 4 TIMES A DAY FOR 31 DAYS   pantoprazole 40 MG tablet Commonly known as:  PROTONIX Take 40 mg by mouth daily.   predniSONE 10 MG tablet Commonly known as:  DELTASONE Take 1 tablet (10 mg total) by mouth daily. Day 1-2: Take 50 mg  ( 5 pills) Day 3-4 : Take 40 mg (4pills) Day 5-6: Take 30 mg (3 pills) Day 7-8:  Take 20 mg (2 pills) Day 9:  Take 10mg  (1 pill)   solifenacin 10 MG tablet Commonly known as:  VESICARE Take 1 tablet (10 mg total) by mouth daily.   tiotropium 18 MCG inhalation capsule Commonly known as:  SPIRIVA Place 18 mcg into inhaler and inhale daily.   valACYclovir 500 MG tablet Commonly known as:  VALTREX Take 500 mg by mouth daily.   Vitamin D3 5000 units Caps Take 10,000 Units by mouth every morning.       Allergies:  Allergies  Allergen Reactions  . Duloxetine Other (See Comments)    Agitation  . Varenicline Other (See Comments)    Agitation  . Duloxetine Hcl Anxiety    Agitation    Family History: Family History  Problem Relation Age of Onset  . Cancer Mother        lung  . Cancer Father        lung  . Cancer Maternal Grandmother        breast  .  Scoliosis Brother   . Arthritis Brother   . Uterine cancer Paternal Grandmother   . Bladder Cancer Neg Hx   . Kidney cancer Neg Hx     Social History:  reports that she has been smoking cigarettes.  She has a 35.00 pack-year smoking history. she has  never used smokeless tobacco. She reports that she does not drink alcohol or use drugs.  ROS: UROLOGY Frequent Urination?: Yes Hard to postpone urination?: Yes Burning/pain with urination?: No Get up at night to urinate?: Yes Leakage of urine?: Yes Urine stream starts and stops?: No Trouble starting stream?: No Do you have to strain to urinate?: No Blood in urine?: No Urinary tract infection?: No Sexually transmitted disease?: No Injury to kidneys or bladder?: No Painful intercourse?: No Weak stream?: No Currently pregnant?: No Vaginal bleeding?: No Last menstrual period?: n  Gastrointestinal Nausea?: No Vomiting?: No Indigestion/heartburn?: No Diarrhea?: Yes Constipation?: No  Constitutional Fever: No Night sweats?: No Weight loss?: No Fatigue?: No  Skin Skin rash/lesions?: No Itching?: No  Eyes Blurred vision?: No Double vision?: No  Ears/Nose/Throat Sore throat?: No Sinus problems?: Yes  Hematologic/Lymphatic Swollen glands?: No Easy bruising?: No  Cardiovascular Leg swelling?: Yes Chest pain?: No  Respiratory Cough?: Yes Shortness of breath?: Yes  Endocrine Excessive thirst?: Yes  Musculoskeletal Back pain?: Yes Joint pain?: Yes  Neurological Headaches?: No Dizziness?: No  Psychologic Depression?: Yes Anxiety?: Yes  Physical Exam: BP 118/90   Pulse 93   Ht 5\' 4"  (1.626 m)   Wt 191 lb (86.6 kg)   BMI 32.79 kg/m   Constitutional:  Alert and oriented, No acute distress.   Laboratory Data: Lab Results  Component Value Date   WBC 7.4 07/15/2017   HGB 15.5 07/15/2017   HCT 44.7 07/15/2017   MCV 93.6 07/15/2017   PLT 212 07/15/2017    Lab Results  Component Value Date    CREATININE 0.77 07/15/2017    No results found for: PSA  Lab Results  Component Value Date   TESTOSTERONE 19 06/17/2017    No results found for: HGBA1C  Urinalysis    Component Value Date/Time   COLORURINE YELLOW (A) 10/01/2016 1540   APPEARANCEUR Clear 07/26/2017 1531   LABSPEC 1.008 10/01/2016 1540   LABSPEC 1.018 10/18/2012 1346   PHURINE 5.0 10/01/2016 1540   GLUCOSEU Negative 07/26/2017 1531   GLUCOSEU Negative 10/18/2012 1346   HGBUR NEGATIVE 10/01/2016 1540   BILIRUBINUR Negative 07/26/2017 1531   BILIRUBINUR Negative 10/18/2012 1346   KETONESUR NEGATIVE 10/01/2016 1540   PROTEINUR Negative 07/26/2017 1531   PROTEINUR NEGATIVE 10/01/2016 1540   NITRITE Negative 07/26/2017 1531   NITRITE NEGATIVE 10/01/2016 1540   LEUKOCYTESUR Negative 07/26/2017 1531   LEUKOCYTESUR Negative 10/18/2012 1346    Pertinent Imaging: none  Assessment & Plan: Reassess in 4 months on combination therapy.  There are no diagnoses linked to this encounter.  No Follow-up on file.  Reece Packer, MD  [facility] [address], [place], [place] [address] [phone]

## 2017-09-22 ENCOUNTER — Other Ambulatory Visit: Payer: Self-pay | Admitting: Certified Nurse Midwife

## 2017-10-05 ENCOUNTER — Other Ambulatory Visit: Payer: Self-pay

## 2017-10-05 MED ORDER — ESTRADIOL 0.5 MG PO TABS: 0.5000 mg | ORAL_TABLET | ORAL | 2 refills | Status: DC

## 2017-11-10 ENCOUNTER — Other Ambulatory Visit: Payer: Self-pay | Admitting: Certified Nurse Midwife

## 2017-11-11 ENCOUNTER — Other Ambulatory Visit: Payer: Self-pay

## 2017-11-11 MED ORDER — ESTRADIOL 0.5 MG PO TABS: 0.5000 mg | ORAL_TABLET | ORAL | 2 refills | Status: DC

## 2017-11-12 ENCOUNTER — Telehealth: Payer: Self-pay | Admitting: Urology

## 2017-11-12 NOTE — Telephone Encounter (Signed)
Pt needs a PA for Toviaz.  When she went to get if filled, it was going to cost $300.  Please give pt a call.

## 2017-11-12 NOTE — Telephone Encounter (Signed)
LMOM that pharmacy should send a request for PA. We will get the request and start the process.

## 2017-11-17 DIAGNOSIS — M542 Cervicalgia: Secondary | ICD-10-CM | POA: Insufficient documentation

## 2017-11-29 ENCOUNTER — Ambulatory Visit: Payer: Medicare Other | Admitting: Urology

## 2017-12-27 ENCOUNTER — Telehealth: Payer: Self-pay | Admitting: Urology

## 2017-12-27 ENCOUNTER — Ambulatory Visit: Payer: Medicare Other | Admitting: Urology

## 2017-12-27 NOTE — Telephone Encounter (Signed)
Patient missed appt today and had to reschedule until 01/17/18.  She does not have enough Toviaz 8mg  to make it to her next appt.    I have provided her with 2 weeks worth of 8mg  Toviaz (Lot # [id number]) samples.

## 2018-01-17 ENCOUNTER — Encounter: Payer: Self-pay | Admitting: Urology

## 2018-01-17 ENCOUNTER — Ambulatory Visit (INDEPENDENT_AMBULATORY_CARE_PROVIDER_SITE_OTHER): Payer: Medicare Other | Admitting: Urology

## 2018-01-17 VITALS — BP 112/71 | HR 94 | Ht 64.0 in | Wt 194.0 lb

## 2018-01-17 DIAGNOSIS — N3946 Mixed incontinence: Secondary | ICD-10-CM

## 2018-01-17 MED ORDER — TOLTERODINE TARTRATE ER 4 MG PO CP24: 4.0000 mg | ORAL_CAPSULE | Freq: Every day | ORAL | 11 refills | Status: DC

## 2018-01-17 NOTE — Progress Notes (Signed)
01/17/2018 1:57 PM   Bonnie Gomez Aug 03, 1965 [id number]  Referring provider: Remi Haggard, [place], [place] [address]  Chief Complaint  Patient presents with  . Urinary Incontinence    15month    HPI: The patient has mixed incontinence with some improvement on double therapy.  Reassess on samples of Toviaz and Myrbetriq.  Once again the bathroom mapping is what is bothering her the most.  She did leak quite a bit when she coughs with smoking but this is not the primary complaint  Last time she was doing very well on double therapy Toviaz and Myrbetriq.  The Vesicare with Myrbetriq or without did not work.  The Lisbeth Ply unfortunately is now $300 and when she goes on and off it she sees a tremendous difference  Clinically not infected and frequency stable   PMH: Past Medical History:  Diagnosis Date  . Allergy   . Anxiety   . Arthritis   . Asthma 2001  . COPD (chronic obstructive pulmonary disease) ([place]) 2013  . Depression   . Emphysema lung ([place])   . 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 Hiatal hernia   . Hypertension   . Insomnia   . Overactive bladder   . Pneumonia    2013  . Thyroid disease   . Vitamin D deficiency     Surgical History: Past Surgical History:  Procedure Laterality Date  . ANGIOPLASTY  2008, 2012  . CHOLECYSTECTOMY  1985  . COLONOSCOPY  2013  . GYNECOLOGIC CRYOSURGERY  1993  . SKIN BIOPSY  2006  . TUBAL LIGATION  1992  . UPPER GI ENDOSCOPY  2013   [facility]    Home Medications:  Allergies as of 01/17/2018      Reactions   Duloxetine Other (See Comments)   Agitation   Varenicline Other (See Comments)   Agitation   Duloxetine Hcl Anxiety   Agitation      Medication List        Accurate as of 01/17/18  1:57 PM. Always use your most recent med list.          PROAIR HFA 108 (90 Base) MCG/ACT inhaler Generic drug:  albuterol Inhale 2 puffs into the lungs every 6 (six) hours as needed for  wheezing or shortness of breath.   albuterol (2.5 MG/3ML) 0.083% nebulizer solution Commonly known as:  PROVENTIL INHALE 1 VIAL USING NEBULIZER EVERY SIX HOURS AS NEEDED   arformoterol 15 MCG/2ML Nebu Commonly known as:  BROVANA Inhale into the lungs.   aspirin EC 81 MG tablet Take 81 mg by mouth every morning.   baclofen 10 MG tablet Commonly known as:  LIORESAL Take 10 mg by mouth 3 (three) times daily as needed for muscle spasms.   BD INTEGRA SYRINGE 25G X 1" 3 ML Misc Generic drug:  SYRINGE-NEEDLE (DISP) 3 ML USE WITH B12   budesonide 0.5 MG/2ML nebulizer solution Commonly known as:  PULMICORT TAKE 2 MLS (0.5 MG TOTAL) BY NEBULIZATION ONCE DAILY.   CHANTIX CONTINUING MONTH PAK 1 MG tablet Generic drug:  varenicline   clonazePAM 1 MG tablet Commonly known as:  KLONOPIN Take 1 mg by mouth 3 (three) times daily as needed for anxiety.   cyanocobalamin 1000 MCG/ML injection Commonly known as:  (VITAMIN B-12) Inject 1,000 mcg into the muscle every 30 (thirty) days. Use on Saturdays.   DALIRESP 500 MCG Tabs tablet Generic drug:  roflumilast Take 500 mcg by  mouth every evening.   DULoxetine 60 MG capsule Commonly known as:  CYMBALTA   estradiol 0.5 MG tablet Commonly known as:  ESTRACE Take 1 tablet (0.5 mg total) by mouth every morning. For 21 days. Do not take for 1 week, then start again.   fentaNYL 50 MCG/HR Commonly known as:  DURAGESIC - dosed mcg/hr APPLY 1 PATCH EVERY 72 HOURS. REMOVE OLD PATCH BEFORE APPLYING A NEW ONE.   fesoterodine 8 MG Tb24 tablet Commonly known as:  TOVIAZ Take 1 tablet (8 mg total) by mouth daily.   FIORICET 50-325-40 MG tablet Generic drug:  butalbital-acetaminophen-caffeine Take 1 tablet by mouth every 4 (four) hours as needed for headache.   FLUoxetine 40 MG capsule Commonly known as:  PROZAC   furosemide 40 MG tablet Commonly known as:  LASIX Take 40 mg by mouth daily as needed for fluid or edema.   gabapentin 300 MG  capsule Commonly known as:  NEURONTIN Take 300 mg by mouth every 6 (six) hours.   hydrochlorothiazide 25 MG tablet Commonly known as:  HYDRODIURIL Take 25 mg by mouth daily.   ketoconazole 2 % cream Commonly known as:  NIZORAL APPLY A SMALL AMOUNT TOPICALLY TWO TIMES A DAY   KLOR-CON M10 10 MEQ tablet Generic drug:  potassium chloride Take 10 mEq by mouth every morning.   levothyroxine 50 MCG tablet Commonly known as:  SYNTHROID, LEVOTHROID Take 50 mcg by mouth every morning.   losartan 50 MG tablet Commonly known as:  COZAAR Take 50 mg by mouth every morning.   medroxyPROGESTERone 2.5 MG tablet Commonly known as:  PROVERA Take 1 tablet (2.5 mg total) by mouth daily.   mirabegron ER 50 MG Tb24 tablet Commonly known as:  MYRBETRIQ Take 1 tablet (50 mg total) by mouth daily.   mometasone 0.1 % cream Commonly known as:  ELOCON Apply 1 application topically 2 (two) times daily.   nicotine 21 mg/24hr patch Commonly known as:  NICODERM CQ - dosed in mg/24 hours Place 1 patch (21 mg total) onto the skin daily.   nystatin powder Commonly known as:  MYCOSTATIN/NYSTOP APPLY SMALL AMOUNT TOPICALLY TWO TIMES A DAY AS NEEDED   ondansetron 4 MG disintegrating tablet Commonly known as:  ZOFRAN-ODT   oxyCODONE-acetaminophen 7.5-325 MG tablet Commonly known as:  PERCOCET TAKE 1 TABLET BY MOUTH 4 TIMES A DAY FOR 31 DAYS   pantoprazole 40 MG tablet Commonly known as:  PROTONIX Take 40 mg by mouth daily.   predniSONE 10 MG tablet Commonly known as:  DELTASONE Take 1 tablet (10 mg total) by mouth daily. Day 1-2: Take 50 mg  ( 5 pills) Day 3-4 : Take 40 mg (4pills) Day 5-6: Take 30 mg (3 pills) Day 7-8:  Take 20 mg (2 pills) Day 9:  Take 10mg  (1 pill)   solifenacin 10 MG tablet Commonly known as:  VESICARE Take 1 tablet (10 mg total) by mouth daily.   tiotropium 18 MCG inhalation capsule Commonly known as:  SPIRIVA Place 18 mcg into inhaler and inhale daily.     valACYclovir 500 MG tablet Commonly known as:  VALTREX Take 500 mg by mouth daily.   Vitamin D3 5000 units Caps Take 10,000 Units by mouth every morning.       Allergies:  Allergies  Allergen Reactions  . Duloxetine Other (See Comments)    Agitation  . Varenicline Other (See Comments)    Agitation  . Duloxetine Hcl Anxiety    Agitation  Family History: Family History  Problem Relation Age of Onset  . Cancer Mother        lung  . Cancer Father        lung  . Cancer Maternal Grandmother        breast  . Scoliosis Brother   . Arthritis Brother   . Uterine cancer Paternal Grandmother   . Bladder Cancer Neg Hx   . Kidney cancer Neg Hx     Social History:  reports that she has been smoking cigarettes.  She has a 35.00 pack-year smoking history. She has never used smokeless tobacco. She reports that she does not drink alcohol or use drugs.  ROS: UROLOGY Frequent Urination?: No Hard to postpone urination?: Yes Burning/pain with urination?: No Get up at night to urinate?: Yes Leakage of urine?: Yes Urine stream starts and stops?: No Trouble starting stream?: No Do you have to strain to urinate?: No Blood in urine?: No Urinary tract infection?: No Sexually transmitted disease?: Yes Injury to kidneys or bladder?: No Painful intercourse?: No Weak stream?: No Currently pregnant?: No Vaginal bleeding?: No Last menstrual period?: n  Gastrointestinal Nausea?: No Vomiting?: No Indigestion/heartburn?: Yes Diarrhea?: Yes Constipation?: No  Constitutional Fever: No Night sweats?: Yes Weight loss?: No Fatigue?: Yes  Skin Skin rash/lesions?: No Itching?: No  Eyes Blurred vision?: Yes Double vision?: No  Ears/Nose/Throat Sore throat?: No Sinus problems?: Yes  Hematologic/Lymphatic Swollen glands?: No Easy bruising?: No  Cardiovascular Leg swelling?: Yes Chest pain?: No  Respiratory Cough?: No Shortness of breath?: Yes  Endocrine Excessive  thirst?: Yes  Musculoskeletal Back pain?: Yes Joint pain?: Yes  Neurological Headaches?: Yes Dizziness?: No  Psychologic Depression?: Yes Anxiety?: Yes  Physical Exam: BP 112/71   Pulse 94   Ht 5\' 4"  (1.626 m)   Wt 88 kg (194 lb)   BMI 33.30 kg/m   Constitutional:  Alert and oriented, No acute distress. HEENT: [place] AT, moist mucus membranes.  Trachea midline, no masses.  Laboratory Data: Lab Results  Component Value Date   WBC 7.4 07/15/2017   HGB 15.5 07/15/2017   HCT 44.7 07/15/2017   MCV 93.6 07/15/2017   PLT 212 07/15/2017    Lab Results  Component Value Date   CREATININE 0.77 07/15/2017    No results found for: PSA  Lab Results  Component Value Date   TESTOSTERONE 19 06/17/2017    No results found for: HGBA1C  Urinalysis    Component Value Date/Time   COLORURINE YELLOW (A) 10/01/2016 1540   APPEARANCEUR Clear 07/26/2017 1531   LABSPEC 1.008 10/01/2016 1540   LABSPEC 1.018 10/18/2012 1346   PHURINE 5.0 10/01/2016 1540   GLUCOSEU Negative 07/26/2017 1531   GLUCOSEU Negative 10/18/2012 1346   HGBUR NEGATIVE 10/01/2016 1540   BILIRUBINUR Negative 07/26/2017 1531   BILIRUBINUR Negative 10/18/2012 1346   KETONESUR NEGATIVE 10/01/2016 1540   PROTEINUR Negative 07/26/2017 1531   PROTEINUR NEGATIVE 10/01/2016 1540   NITRITE Negative 07/26/2017 1531   NITRITE NEGATIVE 10/01/2016 1540   LEUKOCYTESUR Negative 07/26/2017 1531   LEUKOCYTESUR Negative 10/18/2012 1346    Pertinent Imaging: None  Assessment & Plan: Reassess patient in 5 weeks on Detrol LA 4 mg and Myrbetriq.  If this fails I will try oxybutynin.  If this fails we then will try the preauthorization for the [facility] again.  There are no diagnoses linked to this encounter.  No follow-ups on file.  Reece Packer, MD  [facility] [address] [place], [place]  [address] [phone]  

## 2018-01-18 ENCOUNTER — Telehealth: Payer: Self-pay | Admitting: Urology

## 2018-01-18 DIAGNOSIS — N3946 Mixed incontinence: Secondary | ICD-10-CM

## 2018-01-18 NOTE — Telephone Encounter (Signed)
Patient called the office today.  Detrol LA 4mg  is too expensive ($265) for her to fill at the pharmacy to try with Myrbetriq.  She is not able to do the trial.  Do you have another recommendation for her before her next follow up in 5 weeks?  Patient can be reached at [phone] or [phone].

## 2018-01-24 NOTE — Telephone Encounter (Signed)
Oxybutynin 10 mg

## 2018-01-28 ENCOUNTER — Telehealth: Payer: Self-pay | Admitting: Urology

## 2018-01-28 MED ORDER — OXYBUTYNIN CHLORIDE ER 10 MG PO TB24: 10.0000 mg | ORAL_TABLET | Freq: Every day | ORAL | 1 refills | Status: DC

## 2018-01-28 NOTE — Telephone Encounter (Signed)
Patient called back and I informed her of the new script  Bonnie Gomez

## 2018-01-28 NOTE — Telephone Encounter (Signed)
Called pt no answer. LM for pt informing her that an alternative medication has been sent in for her.

## 2018-02-21 ENCOUNTER — Ambulatory Visit (INDEPENDENT_AMBULATORY_CARE_PROVIDER_SITE_OTHER): Payer: Medicare Other | Admitting: Urology

## 2018-02-21 ENCOUNTER — Encounter: Payer: Self-pay | Admitting: Urology

## 2018-02-21 VITALS — BP 126/82 | HR 76 | Ht 64.0 in | Wt 193.2 lb

## 2018-02-21 DIAGNOSIS — N3946 Mixed incontinence: Secondary | ICD-10-CM | POA: Diagnosis not present

## 2018-02-21 MED ORDER — FESOTERODINE FUMARATE ER 8 MG PO TB24: 8.0000 mg | ORAL_TABLET | Freq: Every day | ORAL | 11 refills | Status: DC

## 2018-02-21 NOTE — Progress Notes (Signed)
02/21/2018 3:51 PM   Bonnie Gomez 1966/03/29 [id number]  Referring provider: Remi Haggard, [address] [place], [place] [address]  Chief Complaint  Patient presents with  . Follow-up    HPI: The patient has mixed incontinence with some improvement on double therapy.Reassess on samples of Toviaz and Myrbetriq. Once again the bathroom mapping is what is bothering her the most. She did leak quite a bit when she coughs with smoking but this is not the primary complaint  Last time she was doing very well on double therapy Toviaz and Myrbetriq.  The Vesicare with Myrbetriq or without did not work.  The Lisbeth Ply unfortunately is now $300 and when she goes on and off it she sees a tremendous difference   Reassess patient in 5 weeks on Detrol LA 4 mg and Myrbetriq.  If this fails I will try oxybutynin.  If this fails we then will try the preauthorization for the [place] again.  Today Frequency is stable.  Oddly Detrol was very expensive.  She is on oxybutynin ER 10 mg of the Myrbetriq and is helping some.  Less urgency.  No infections.   PMH: Past Medical History:  Diagnosis Date  . Allergy   . Anxiety   . Arthritis   . Asthma 2001  . COPD (chronic obstructive pulmonary disease) ([place]) 2013  . Depression   . Emphysema lung ([place])   . 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 Hiatal hernia   . Hypertension   . Insomnia   . Overactive bladder   . Pneumonia    2013  . Thyroid disease   . Vitamin D deficiency     Surgical History: Past Surgical History:  Procedure Laterality Date  . ANGIOPLASTY  2008, 2012  . CHOLECYSTECTOMY  1985  . COLONOSCOPY  2013  . GYNECOLOGIC CRYOSURGERY  1993  . SKIN BIOPSY  2006  . TUBAL LIGATION  1992  . UPPER GI ENDOSCOPY  2013   [facility]    Home Medications:  Allergies as of 02/21/2018      Reactions   Duloxetine Other (See Comments)   Agitation   Varenicline Other (See Comments)   Agitation   Duloxetine Hcl  Anxiety   Agitation      Medication List        Accurate as of 02/21/18  3:51 PM. Always use your most recent med list.          PROAIR HFA 108 (90 Base) MCG/ACT inhaler Generic drug:  albuterol Inhale 2 puffs into the lungs every 6 (six) hours as needed for wheezing or shortness of breath.   albuterol (2.5 MG/3ML) 0.083% nebulizer solution Commonly known as:  PROVENTIL INHALE 1 VIAL USING NEBULIZER EVERY SIX HOURS AS NEEDED   arformoterol 15 MCG/2ML Nebu Commonly known as:  BROVANA Inhale into the lungs.   aspirin EC 81 MG tablet Take 81 mg by mouth every morning.   baclofen 10 MG tablet Commonly known as:  LIORESAL Take 10 mg by mouth 3 (three) times daily as needed for muscle spasms.   BD INTEGRA SYRINGE 25G X 1" 3 ML Misc Generic drug:  SYRINGE-NEEDLE (DISP) 3 ML USE WITH B12   budesonide 0.5 MG/2ML nebulizer solution Commonly known as:  PULMICORT TAKE 2 MLS (0.5 MG TOTAL) BY NEBULIZATION ONCE DAILY.   CHANTIX CONTINUING MONTH PAK 1 MG tablet Generic drug:  varenicline   clonazePAM 1 MG tablet Commonly known as:  KLONOPIN Take  1 mg by mouth 3 (three) times daily as needed for anxiety.   cyanocobalamin 1000 MCG/ML injection Commonly known as:  (VITAMIN B-12) Inject 1,000 mcg into the muscle every 30 (thirty) days. Use on Saturdays.   DALIRESP 500 MCG Tabs tablet Generic drug:  roflumilast Take 500 mcg by mouth every evening.   DULoxetine 60 MG capsule Commonly known as:  CYMBALTA   estradiol 0.5 MG tablet Commonly known as:  ESTRACE Take 1 tablet (0.5 mg total) by mouth every morning. For 21 days. Do not take for 1 week, then start again.   fentaNYL 50 MCG/HR Commonly known as:  DURAGESIC - dosed mcg/hr APPLY 1 PATCH EVERY 72 HOURS. REMOVE OLD PATCH BEFORE APPLYING A NEW ONE.   FIORICET 50-325-40 MG tablet Generic drug:  butalbital-acetaminophen-caffeine Take 1 tablet by mouth every 4 (four) hours as needed for headache.   FLUoxetine 40 MG  capsule Commonly known as:  PROZAC   furosemide 40 MG tablet Commonly known as:  LASIX Take 40 mg by mouth daily as needed for fluid or edema.   gabapentin 300 MG capsule Commonly known as:  NEURONTIN Take 300 mg by mouth every 6 (six) hours.   hydrochlorothiazide 25 MG tablet Commonly known as:  HYDRODIURIL Take 25 mg by mouth daily.   ketoconazole 2 % cream Commonly known as:  NIZORAL APPLY A SMALL AMOUNT TOPICALLY TWO TIMES A DAY   KLOR-CON M10 10 MEQ tablet Generic drug:  potassium chloride Take 10 mEq by mouth every morning.   levothyroxine 50 MCG tablet Commonly known as:  SYNTHROID, LEVOTHROID Take 50 mcg by mouth every morning.   losartan 50 MG tablet Commonly known as:  COZAAR Take 50 mg by mouth every morning.   medroxyPROGESTERone 2.5 MG tablet Commonly known as:  PROVERA Take 1 tablet (2.5 mg total) by mouth daily.   mirabegron ER 50 MG Tb24 tablet Commonly known as:  MYRBETRIQ Take 1 tablet (50 mg total) by mouth daily.   mometasone 0.1 % cream Commonly known as:  ELOCON Apply 1 application topically 2 (two) times daily.   nicotine 21 mg/24hr patch Commonly known as:  NICODERM CQ - dosed in mg/24 hours Place 1 patch (21 mg total) onto the skin daily.   nystatin powder Commonly known as:  MYCOSTATIN/NYSTOP APPLY SMALL AMOUNT TOPICALLY TWO TIMES A DAY AS NEEDED   ondansetron 4 MG disintegrating tablet Commonly known as:  ZOFRAN-ODT   oxybutynin 10 MG 24 hr tablet Commonly known as:  DITROPAN-XL Take 1 tablet (10 mg total) by mouth daily.   oxyCODONE-acetaminophen 7.5-325 MG tablet Commonly known as:  PERCOCET TAKE 1 TABLET BY MOUTH 4 TIMES A DAY FOR 31 DAYS   pantoprazole 40 MG tablet Commonly known as:  PROTONIX Take 40 mg by mouth daily.   tiotropium 18 MCG inhalation capsule Commonly known as:  SPIRIVA Place 18 mcg into inhaler and inhale daily.   valACYclovir 500 MG tablet Commonly known as:  VALTREX Take 500 mg by mouth daily.    Vitamin D3 5000 units Caps Take 10,000 Units by mouth every morning.       Allergies:  Allergies  Allergen Reactions  . Duloxetine Other (See Comments)    Agitation  . Varenicline Other (See Comments)    Agitation  . Duloxetine Hcl Anxiety    Agitation    Family History: Family History  Problem Relation Age of Onset  . Cancer Mother        lung  . Cancer Father  lung  . Cancer Maternal Grandmother        breast  . Scoliosis Brother   . Arthritis Brother   . Uterine cancer Paternal Grandmother   . Bladder Cancer Neg Hx   . Kidney cancer Neg Hx     Social History:  reports that she has been smoking cigarettes.  She has a 35.00 pack-year smoking history. She has never used smokeless tobacco. She reports that she does not drink alcohol or use drugs.  ROS:                                        Physical Exam: BP 126/82 (BP Location: Left Arm, Patient Position: Sitting, Cuff Size: Normal)   Pulse 76   Ht 5\' 4"  (1.626 m)   Wt 193 lb 3.2 oz (87.6 kg)   BMI 33.16 kg/m   Constitutional:  Alert and oriented, No acute distress.   Laboratory Data: Lab Results  Component Value Date   WBC 7.4 07/15/2017   HGB 15.5 07/15/2017   HCT 44.7 07/15/2017   MCV 93.6 07/15/2017   PLT 212 07/15/2017    Lab Results  Component Value Date   CREATININE 0.77 07/15/2017    No results found for: PSA  Lab Results  Component Value Date   TESTOSTERONE 19 06/17/2017    No results found for: HGBA1C  Urinalysis    Component Value Date/Time   COLORURINE YELLOW (A) 10/01/2016 1540   APPEARANCEUR Clear 07/26/2017 1531   LABSPEC 1.008 10/01/2016 1540   LABSPEC 1.018 10/18/2012 1346   PHURINE 5.0 10/01/2016 1540   GLUCOSEU Negative 07/26/2017 1531   GLUCOSEU Negative 10/18/2012 1346   HGBUR NEGATIVE 10/01/2016 1540   BILIRUBINUR Negative 07/26/2017 1531   BILIRUBINUR Negative 10/18/2012 1346   KETONESUR NEGATIVE 10/01/2016 1540   PROTEINUR  Negative 07/26/2017 1531   PROTEINUR NEGATIVE 10/01/2016 1540   NITRITE Negative 07/26/2017 1531   NITRITE NEGATIVE 10/01/2016 1540   LEUKOCYTESUR Negative 07/26/2017 1531   LEUKOCYTESUR Negative 10/18/2012 1346    Pertinent Imaging:   Assessment & Plan: Prescribed Toviaz hoping for step therapy.  Reevaluate in about 3 months  There are no diagnoses linked to this encounter.  No follow-ups on file.  Reece Packer, MD  [facility] [address], [place], [place] [address] [phone]

## 2018-02-21 NOTE — Addendum Note (Signed)
Addended by: Kyra Manges on: 02/21/2018 03:58 PM   Modules accepted: Orders

## 2018-03-07 ENCOUNTER — Other Ambulatory Visit: Payer: Self-pay

## 2018-03-07 MED ORDER — MEDROXYPROGESTERONE ACETATE 2.5 MG PO TABS: 2.5000 mg | ORAL_TABLET | Freq: Every day | ORAL | 2 refills | Status: AC

## 2018-05-24 ENCOUNTER — Other Ambulatory Visit: Payer: Self-pay | Admitting: Certified Nurse Midwife

## 2018-05-25 ENCOUNTER — Telehealth: Payer: Self-pay | Admitting: Urology

## 2018-05-25 NOTE — Telephone Encounter (Signed)
Pt has questions about meds and prior authorizations.  Please give her a call [phone]  I had to resch her 11/11 appt w/MacDiarmid

## 2018-05-30 ENCOUNTER — Ambulatory Visit: Payer: Medicare Other | Admitting: Urology

## 2018-06-01 NOTE — Telephone Encounter (Signed)
Left message for pt to call our office to discuss her medication and prior authorization.

## 2018-06-27 ENCOUNTER — Telehealth: Payer: Self-pay | Admitting: Urology

## 2018-06-27 ENCOUNTER — Ambulatory Visit: Payer: Medicare Other | Admitting: Urology

## 2018-06-27 DIAGNOSIS — N3946 Mixed incontinence: Secondary | ICD-10-CM

## 2018-06-27 NOTE — Telephone Encounter (Signed)
Attempted to reach pt on first number to be requested, per note it does not give the ok to leave vm. Will await for call back before attempting second provided number

## 2018-06-27 NOTE — Telephone Encounter (Signed)
Please call pt.  She returned call concerning medications and prior authorizations.  Please call [phone] O.K. To l/m on [phone].  Try 1st number 1st please.

## 2018-06-28 MED ORDER — FESOTERODINE FUMARATE ER 8 MG PO TB24: 8.0000 mg | ORAL_TABLET | Freq: Every day | ORAL | 11 refills | Status: DC

## 2018-06-28 NOTE — Telephone Encounter (Signed)
Called pt she states that she has been waiting on a prior on the medication Toviaz for several weeks. Pt states that she is having worsening symptoms now that she is out of oxybutynin, and only taking myerbetriq. Gave patients samples of Toviaz x 1 month. Will initiate prior auth today.

## 2018-07-10 ENCOUNTER — Other Ambulatory Visit: Payer: Self-pay | Admitting: Certified Nurse Midwife

## 2018-07-21 ENCOUNTER — Other Ambulatory Visit: Payer: Self-pay

## 2018-07-21 MED ORDER — ESTRADIOL 0.5 MG PO TABS: 0.5000 mg | ORAL_TABLET | Freq: Every day | ORAL | 0 refills | Status: DC

## 2018-07-30 ENCOUNTER — Emergency Department: Payer: Medicare Other

## 2018-07-30 ENCOUNTER — Other Ambulatory Visit: Payer: Self-pay

## 2018-07-30 ENCOUNTER — Encounter: Payer: Self-pay | Admitting: Emergency Medicine

## 2018-07-30 ENCOUNTER — Emergency Department
Admission: EM | Admit: 2018-07-30 | Discharge: 2018-07-30 | Disposition: A | Discharge status: Home / Self Care | Payer: Medicare Other | Attending: Emergency Medicine | Admitting: Emergency Medicine

## 2018-07-30 DIAGNOSIS — J449 Chronic obstructive pulmonary disease, unspecified: Secondary | ICD-10-CM | POA: Insufficient documentation

## 2018-07-30 DIAGNOSIS — S161XXA Strain of muscle, fascia and tendon at neck level, initial encounter: Secondary | ICD-10-CM | POA: Diagnosis not present

## 2018-07-30 DIAGNOSIS — M79672 Pain in left foot: Secondary | ICD-10-CM | POA: Insufficient documentation

## 2018-07-30 DIAGNOSIS — Y939 Activity, unspecified: Secondary | ICD-10-CM | POA: Insufficient documentation

## 2018-07-30 DIAGNOSIS — Z7982 Long term (current) use of aspirin: Secondary | ICD-10-CM | POA: Diagnosis not present

## 2018-07-30 DIAGNOSIS — M545 Low back pain, unspecified: Secondary | ICD-10-CM

## 2018-07-30 DIAGNOSIS — S39012A Strain of muscle, fascia and tendon of lower back, initial encounter: Secondary | ICD-10-CM | POA: Insufficient documentation

## 2018-07-30 DIAGNOSIS — Y929 Unspecified place or not applicable: Secondary | ICD-10-CM | POA: Diagnosis not present

## 2018-07-30 DIAGNOSIS — W1789XA Other fall from one level to another, initial encounter: Secondary | ICD-10-CM | POA: Insufficient documentation

## 2018-07-30 DIAGNOSIS — F17210 Nicotine dependence, cigarettes, uncomplicated: Secondary | ICD-10-CM | POA: Diagnosis not present

## 2018-07-30 DIAGNOSIS — S3992XA Unspecified injury of lower back, initial encounter: Secondary | ICD-10-CM | POA: Diagnosis present

## 2018-07-30 DIAGNOSIS — Y999 Unspecified external cause status: Secondary | ICD-10-CM | POA: Diagnosis not present

## 2018-07-30 DIAGNOSIS — I10 Essential (primary) hypertension: Secondary | ICD-10-CM | POA: Diagnosis not present

## 2018-07-30 DIAGNOSIS — Z79899 Other long term (current) drug therapy: Secondary | ICD-10-CM | POA: Insufficient documentation

## 2018-07-30 MED ORDER — METHOCARBAMOL 750 MG PO TABS: 750.0000 mg | ORAL_TABLET | Freq: Three times a day (TID) | ORAL | 0 refills | Status: DC | PRN

## 2018-07-30 MED ORDER — METHOCARBAMOL 500 MG PO TABS
750.0000 mg | ORAL_TABLET | Freq: Once | ORAL | Status: AC
  Administered 2018-07-30: 750 mg via ORAL
  Filled 2018-07-30: qty 2

## 2018-07-30 NOTE — ED Triage Notes (Signed)
Fell onto [employer] on 07/21/2018.  Arrives with C/O back, neck, and right hip pain.

## 2018-07-30 NOTE — Discharge Instructions (Signed)
Please follow up with your primary care provider if not improving over the week.  Return to the ER for symptoms that change or worsen if unable to schedule an appointment. 

## 2018-07-30 NOTE — ED Provider Notes (Signed)
[facility] Emergency Department Provider Note ____________________________________________  Time seen: Approximately 8:15 PM  I have reviewed the triage vital signs and the nursing notes.   HISTORY  Chief Complaint Fall    HPI Bonnie Gomez is a 53 y.o. female who presents to the emergency department for evaluation and treatment of neck, back, and left foot pain since January 2.  She states that she stepped down out of a truck and did not realize how far she was from the ground.  She is not sure what she hit, but she states that she does have a large bruise on the left hip.  Pain has continued to increase despite taking her daily chronic pain medication.  Past Medical History:  Diagnosis Date  . Allergy   . Anxiety   . Arthritis   . Asthma 2001  . COPD (chronic obstructive pulmonary disease) ([place]) 2013  . Depression   . Emphysema lung ([place])   . 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 Hiatal hernia   . Hypertension   . Insomnia   . Overactive bladder   . Pneumonia    2013  . Thyroid disease   . Vitamin D deficiency     Patient Active Problem List   Diagnosis Date Noted  . Post-menopausal 05/03/2017  . COPD exacerbation ([place]) 02/29/2016  . Condyloma acuminatum in female 04/29/2015  . Perineal mass in female 04/11/2015  . Anxiety 12/05/2013  . Sleep apnea 12/05/2013  . Migraine 12/05/2013    Past Surgical History:  Procedure Laterality Date  . ANGIOPLASTY  2008, 2012  . CHOLECYSTECTOMY  1985  . COLONOSCOPY  2013  . GYNECOLOGIC CRYOSURGERY  1993  . SKIN BIOPSY  2006  . TUBAL LIGATION  1992  . UPPER GI ENDOSCOPY  2013   [facility]    Prior to Admission medications   Medication Sig Start Date End Date Taking? Authorizing Provider  albuterol (PROAIR HFA) 108 (90 BASE) MCG/ACT inhaler Inhale 2 puffs into the lungs every 6 (six) hours as needed for wheezing or shortness of breath.     [provider]  albuterol  (PROVENTIL) (2.5 MG/3ML) 0.083% nebulizer solution INHALE 1 VIAL USING NEBULIZER EVERY SIX HOURS AS NEEDED 04/03/14   [provider]  arformoterol (BROVANA) 15 MCG/2ML NEBU Inhale into the lungs. 02/17/17   [provider]  aspirin EC 81 MG tablet Take 81 mg by mouth every morning.     [provider]  baclofen (LIORESAL) 10 MG tablet Take 10 mg by mouth 3 (three) times daily as needed for muscle spasms.    [provider]  BD INTEGRA SYRINGE 25G X 1" 3 ML MISC USE WITH B12 01/09/15   [provider]  budesonide (PULMICORT) 0.5 MG/2ML nebulizer solution TAKE 2 MLS (0.5 MG TOTAL) BY NEBULIZATION ONCE DAILY. 04/18/17   [provider]  butalbital-acetaminophen-caffeine (FIORICET) 50-325-40 MG tablet Take 1 tablet by mouth every 4 (four) hours as needed for headache.    [provider]  CHANTIX CONTINUING MONTH PAK 1 MG tablet  09/21/16   [provider]  Cholecalciferol (VITAMIN D3) 5000 units CAPS Take 10,000 Units by mouth every morning.    [provider]  clonazePAM (KLONOPIN) 1 MG tablet Take 1 mg by mouth 3 (three) times daily as needed for anxiety.     [provider]  cyanocobalamin (,VITAMIN B-12,) 1000 MCG/ML injection Inject 1,000 mcg into the muscle every 30 (thirty)  days. Use on Saturdays.     [provider]  DALIRESP 500 MCG TABS tablet Take 500 mcg by mouth every evening.  01/10/16   [provider]  DULoxetine (CYMBALTA) 60 MG capsule  09/21/16   [provider]  estradiol (ESTRACE) 0.5 MG tablet Take 1 tablet (0.5 mg total) by mouth every morning. For 21 days. Do not take for 1 week, then start again. 11/11/17   Lawhorn, Lara Mulch, CNM  estradiol (ESTRACE) 0.5 MG tablet Take 1 tablet (0.5 mg total) by mouth daily. 07/21/18   Lawhorn, Lara Mulch, CNM  fentaNYL (DURAGESIC - DOSED MCG/HR) 50 MCG/HR APPLY 1 PATCH EVERY 72 HOURS. REMOVE OLD PATCH BEFORE APPLYING A NEW ONE.  09/10/16   [provider]  fesoterodine (TOVIAZ) 8 MG TB24 tablet Take 1 tablet (8 mg total) by mouth daily. 06/28/18   Bjorn Loser, MD  FLUoxetine (PROZAC) 40 MG capsule  09/25/16   [provider]  furosemide (LASIX) 40 MG tablet Take 40 mg by mouth daily as needed for fluid or edema.  04/07/15   [provider]  gabapentin (NEURONTIN) 300 MG capsule Take 300 mg by mouth every 6 (six) hours.  03/21/15   [provider]  hydrochlorothiazide (HYDRODIURIL) 25 MG tablet Take 25 mg by mouth daily. 12/30/15   [provider]  ketoconazole (NIZORAL) 2 % cream APPLY A SMALL AMOUNT TOPICALLY TWO TIMES A DAY 04/08/15   [provider]  levothyroxine (SYNTHROID, LEVOTHROID) 50 MCG tablet Take 50 mcg by mouth every morning. 03/21/15   [provider]  losartan (COZAAR) 50 MG tablet Take 50 mg by mouth every morning.    [provider]  medroxyPROGESTERone (PROVERA) 2.5 MG tablet Take 1 tablet (2.5 mg total) by mouth daily. 03/07/18   Lawhorn, Lara Mulch, CNM  methocarbamol (ROBAXIN-750) 750 MG tablet Take 1 tablet (750 mg total) by mouth every 8 (eight) hours as needed for muscle spasms. 07/30/18   Mayrin Schmuck, Johnette Abraham B, FNP  mirabegron ER (MYRBETRIQ) 50 MG TB24 tablet Take 1 tablet (50 mg total) by mouth daily. 06/14/17   MacDiarmid, Nicki Reaper, MD  mometasone (ELOCON) 0.1 % cream Apply 1 application topically 2 (two) times daily.    [provider]  nicotine (NICODERM CQ - DOSED IN MG/24 HOURS) 21 mg/24hr patch Place 1 patch (21 mg total) onto the skin daily. 03/01/16   Bettey Costa, MD  nystatin (MYCOSTATIN/NYSTOP) powder APPLY SMALL AMOUNT TOPICALLY TWO TIMES A DAY AS NEEDED 02/23/17   [provider]  ondansetron (ZOFRAN-ODT) 4 MG disintegrating tablet  04/27/17   [provider]  oxybutynin (DITROPAN-XL) 10 MG 24 hr tablet Take 1 tablet (10 mg total) by mouth daily. 01/28/18   MacDiarmid, Nicki Reaper, MD   oxyCODONE-acetaminophen (PERCOCET) 7.5-325 MG tablet TAKE 1 TABLET BY MOUTH 4 TIMES A DAY FOR 31 DAYS 09/21/16   [provider]  pantoprazole (PROTONIX) 40 MG tablet Take 40 mg by mouth daily. 04/07/17   [provider]  potassium chloride (KLOR-CON M10) 10 MEQ tablet Take 10 mEq by mouth every morning.    [provider]  tiotropium (SPIRIVA) 18 MCG inhalation capsule Place 18 mcg into inhaler and inhale daily.     [provider]  valACYclovir (VALTREX) 500 MG tablet Take 500 mg by mouth daily. 01/09/16   [provider]    Allergies Duloxetine; Varenicline; and Duloxetine hcl  Family History  Problem Relation Age of Onset  . Cancer Mother  lung  . Cancer Father        lung  . Cancer Maternal Grandmother        breast  . Scoliosis Brother   . Arthritis Brother   . Uterine cancer Paternal Grandmother   . Bladder Cancer Neg Hx   . Kidney cancer Neg Hx     Social History Social History   Tobacco Use  . Smoking status: Current Every Day Smoker    Packs/day: 1.00    Years: 35.00    Pack years: 35.00    Types: Cigarettes  . Smokeless tobacco: Never Used  . Tobacco comment: "not now "per pt  Substance Use Topics  . Alcohol use: No    Alcohol/week: 0.0 standard drinks  . Drug use: No    Review of Systems Constitutional: Negative for fever. Cardiovascular: Negative for chest pain. Respiratory: Negative for shortness of breath. Musculoskeletal: Positive for neck, back, and left foot pain Skin: Positive for contusion of the left hip Neurological: Negative for decrease in sensation  ____________________________________________   PHYSICAL EXAM:  VITAL SIGNS: ED Triage Vitals  Enc Vitals Group     BP 07/30/18 1757 138/77     Pulse Rate 07/30/18 1757 86     Resp 07/30/18 1757 16     Temp 07/30/18 1757 98.5 F (36.9 C)     Temp Source 07/30/18 1757 Oral     SpO2 07/30/18 1757 96 %     Weight 07/30/18 1756 192 lb  (87.1 kg)     Height 07/30/18 1756 5\' 3"  (1.6 m)     Head Circumference --      Peak Flow --      Pain Score 07/30/18 1755 9     Pain Loc --      Pain Edu? --      Excl. in [place]? --     Constitutional: Alert and oriented. Well appearing and in no acute distress. Eyes: Conjunctivae are clear without discharge or drainage Head: Atraumatic Neck:  Respiratory: No cough. Respirations are even and unlabored. Musculoskeletal: Midline tenderness along the cervical, mid thorax and lumbar spine.  Tenderness over the dorsal aspect of the left foot overlying the mid metatarsals without obvious deformity. Neurologic: Awake, alert, oriented x4. Skin: Sizable contusion noted to the left upper, lateral thigh.  Otherwise, no open wounds or lesions noted over the areas of pain. Psychiatric: Affect and behavior are appropriate.  ____________________________________________   LABS (all labs ordered are listed, but only abnormal results are displayed)  Labs Reviewed - No data to display ____________________________________________  RADIOLOGY  Images of the cervical spine, thoracic spine, lumbar spine, and left foot are all negative for acute bony abnormality per radiology. ____________________________________________   PROCEDURES  Procedures  ____________________________________________   INITIAL IMPRESSION / ASSESSMENT AND PLAN / ED COURSE  Bonnie Gomez is a 53 y.o. who presents to the emergency department for treatment and evaluation of neck, back, and foot pain after injury on January 2.  Exam is most consistent with muscle strain, but the patient has significant past medical history of back issues including scoliosis, degenerative disc disease, and disc bulge/herniation.  Images will be obtained to ensure that she does not have a compression fracture.  We will also image the left foot to rule out a midfoot fracture as she is unsure if it twisted when she landed on the ground.  Images  were all reassuring.  The patient will be treated with Robaxin and will continue her chronic  pain meds.  She is to schedule a follow-up appointment with her primary care provider if not improving over the next week or so.  She was advised to return to the emergency department for symptoms of change or worsen if unable to schedule an appointment.  Medications  methocarbamol (ROBAXIN) tablet 750 mg (750 mg Oral Given 07/30/18 2117)    Pertinent labs & imaging results that were available during my care of the patient were reviewed by me and considered in my medical decision making (see chart for details).  _________________________________________   FINAL CLINICAL IMPRESSION(S) / ED DIAGNOSES  Final diagnoses:  Acute lumbar back pain  Back strain, initial encounter  Acute strain of neck muscle, initial encounter  Foot pain, left    ED Discharge Orders         Ordered    methocarbamol (ROBAXIN-750) 750 MG tablet  Every 8 hours PRN     07/30/18 2111           If controlled substance prescribed during this visit, 12 month history viewed on the [place] prior to issuing an initial prescription for Schedule II or III opiod.    Victorino Dike, FNP 07/30/18 2150    Earleen Newport, MD 07/30/18 2211

## 2018-08-01 ENCOUNTER — Encounter: Payer: Self-pay | Admitting: Urology

## 2018-08-01 ENCOUNTER — Encounter

## 2018-08-01 ENCOUNTER — Ambulatory Visit (INDEPENDENT_AMBULATORY_CARE_PROVIDER_SITE_OTHER): Payer: Medicare Other | Admitting: Urology

## 2018-08-01 DIAGNOSIS — N3946 Mixed incontinence: Secondary | ICD-10-CM | POA: Diagnosis not present

## 2018-08-01 MED ORDER — FESOTERODINE FUMARATE ER 8 MG PO TB24: 8.0000 mg | ORAL_TABLET | Freq: Every day | ORAL | 2 refills | Status: DC

## 2018-08-01 MED ORDER — FESOTERODINE FUMARATE ER 8 MG PO TB24: 8.0000 mg | ORAL_TABLET | Freq: Every day | ORAL | 1 refills | Status: DC

## 2018-08-01 NOTE — Progress Notes (Signed)
08/01/2018 10:32 AM   Bonnie Gomez 12-23-1965 [id number]  Referring provider: Remi Haggard, [place], [place] [address]  Chief Complaint  Patient presents with  . Mixed Incontinence    3 mo follow up    HPI: The patient has mixed incontinence with some improvement on double therapy.Reassess on samples of Toviaz and Myrbetriq. Once again the bathroom mapping is what is bothering her the most. She did leak quite a bit when she coughs with smoking but this is not the primary complaint.  Patient has not had urodynamics and had a negative cough test after normal cystoscopy  Last time she was doing very well on double therapy Toviaz and Myrbetriq. The Vesicare with Myrbetriq or without did not work. The Lisbeth Ply unfortunately is now $300 and when she goes on and off it she sees a tremendous difference   Reassess patient in 5 weeks on Detrol LA 4 mg and Myrbetriq. If this fails I will try oxybutynin. If this fails we then will try the preauthorization for the [place] again.   Oddly Detrol was very expensive.  She is on oxybutynin ER 10 mg of the Myrbetriq and is helping some.  Less urgency.  No infections. Prescribed Toviaz hoping for step therapy.  Reevaluate in about 3 months  Today Patient is nearly completely dry on Toviaz and Myrbetriq.  Once she was dreaming and leaked and once she leak with a fall.  She is improved on oxybutynin but near not as well and the Detrol was expensive apparently.  Clinically not infected.  PMH: Past Medical History:  Diagnosis Date  . Allergy   . Anxiety   . Arthritis   . Asthma 2001  . COPD (chronic obstructive pulmonary disease) ([facility]) 2013  . Depression   . Emphysema lung ([facility])   . 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 Hiatal hernia   . Hypertension   . Insomnia   . Overactive bladder   . Pneumonia    2013  . Thyroid disease   . Vitamin D deficiency     Surgical History: Past Surgical  History:  Procedure Laterality Date  . ANGIOPLASTY  2008, 2012  . CHOLECYSTECTOMY  1985  . COLONOSCOPY  2013  . GYNECOLOGIC CRYOSURGERY  1993  . SKIN BIOPSY  2006  . TUBAL LIGATION  1992  . UPPER GI ENDOSCOPY  2013   [facility]    Home Medications:  Allergies as of 08/01/2018      Reactions   Duloxetine Other (See Comments)   Agitation   Varenicline Other (See Comments)   Agitation   Duloxetine Hcl Anxiety   Agitation      Medication List       Accurate as of August 01, 2018 10:32 AM. Always use your most recent med list.        albuterol (2.5 MG/3ML) 0.083% nebulizer solution Commonly known as:  PROVENTIL INHALE 1 VIAL USING NEBULIZER EVERY SIX HOURS AS NEEDED   amoxicillin-clavulanate 500-125 MG tablet Commonly known as:  AUGMENTIN Take 1 tablet by mouth 3 (three) times daily.   arformoterol 15 MCG/2ML Nebu Commonly known as:  BROVANA Inhale into the lungs.   aspirin EC 81 MG tablet Take 81 mg by mouth every morning.   baclofen 10 MG tablet Commonly known as:  LIORESAL Take 10 mg by mouth 3 (three) times daily as needed for muscle spasms.   BD INTEGRA SYRINGE 25G X 1" 3  ML Misc Generic drug:  SYRINGE-NEEDLE (DISP) 3 ML USE WITH B12   budesonide 0.5 MG/2ML nebulizer solution Commonly known as:  PULMICORT TAKE 2 MLS (0.5 MG TOTAL) BY NEBULIZATION ONCE DAILY.   CHANTIX CONTINUING MONTH PAK 1 MG tablet Generic drug:  varenicline   clonazePAM 1 MG tablet Commonly known as:  KLONOPIN Take 1 mg by mouth 3 (three) times daily as needed for anxiety.   DALIRESP 500 MCG Tabs tablet Generic drug:  roflumilast Take 500 mcg by mouth every evening.   DULoxetine 60 MG capsule Commonly known as:  CYMBALTA   estradiol 0.5 MG tablet Commonly known as:  ESTRACE Take 1 tablet (0.5 mg total) by mouth every morning. For 21 days. Do not take for 1 week, then start again.   estradiol 0.5 MG tablet Commonly known as:  ESTRACE Take 1 tablet (0.5 mg total) by mouth  daily.   fentaNYL 50 MCG/HR Commonly known as:  DURAGESIC - dosed mcg/hr APPLY 1 PATCH EVERY 72 HOURS. REMOVE OLD PATCH BEFORE APPLYING A NEW ONE.   fesoterodine 8 MG Tb24 tablet Commonly known as:  TOVIAZ Take 1 tablet (8 mg total) by mouth daily.   FIORICET 50-325-40 MG tablet Generic drug:  butalbital-acetaminophen-caffeine Take 1 tablet by mouth every 4 (four) hours as needed for headache.   fluconazole 150 MG tablet Commonly known as:  DIFLUCAN Take 150 mg by mouth daily.   FLUoxetine 40 MG capsule Commonly known as:  PROZAC   furosemide 40 MG tablet Commonly known as:  LASIX Take 40 mg by mouth daily as needed for fluid or edema.   gabapentin 300 MG capsule Commonly known as:  NEURONTIN Take 300 mg by mouth every 6 (six) hours.   hydrochlorothiazide 25 MG tablet Commonly known as:  HYDRODIURIL Take 25 mg by mouth daily.   ketoconazole 2 % cream Commonly known as:  NIZORAL APPLY A SMALL AMOUNT TOPICALLY TWO TIMES A DAY   KLOR-CON M10 10 MEQ tablet Generic drug:  potassium chloride Take 10 mEq by mouth every morning.   levothyroxine 50 MCG tablet Commonly known as:  SYNTHROID, LEVOTHROID Take 50 mcg by mouth every morning.   losartan 50 MG tablet Commonly known as:  COZAAR Take 50 mg by mouth every morning.   medroxyPROGESTERone 2.5 MG tablet Commonly known as:  PROVERA Take 1 tablet (2.5 mg total) by mouth daily.   methocarbamol 750 MG tablet Commonly known as:  ROBAXIN-750 Take 1 tablet (750 mg total) by mouth every 8 (eight) hours as needed for muscle spasms.   mirabegron ER 50 MG Tb24 tablet Commonly known as:  MYRBETRIQ Take 1 tablet (50 mg total) by mouth daily.   mometasone 0.1 % cream Commonly known as:  ELOCON Apply 1 application topically 2 (two) times daily.   mometasone 50 MCG/ACT nasal spray Commonly known as:  NASONEX Place into the nose.   nystatin powder Commonly known as:  MYCOSTATIN/NYSTOP APPLY SMALL AMOUNT TOPICALLY TWO  TIMES A DAY AS NEEDED   ondansetron 4 MG disintegrating tablet Commonly known as:  ZOFRAN-ODT   oxybutynin 10 MG 24 hr tablet Commonly known as:  DITROPAN-XL Take 1 tablet (10 mg total) by mouth daily.   oxyCODONE-acetaminophen 7.5-325 MG tablet Commonly known as:  PERCOCET TAKE 1 TABLET BY MOUTH 4 TIMES A DAY FOR 31 DAYS   pantoprazole 40 MG tablet Commonly known as:  PROTONIX Take 40 mg by mouth daily.   predniSONE 20 MG tablet Commonly known as:  DELTASONE Take  20 mg by mouth daily with breakfast.   tiotropium 18 MCG inhalation capsule Commonly known as:  SPIRIVA Place 18 mcg into inhaler and inhale daily.   valACYclovir 500 MG tablet Commonly known as:  VALTREX Take 500 mg by mouth daily.   Vitamin D3 125 MCG (5000 UT) Caps Take 10,000 Units by mouth every morning.       Allergies:  Allergies  Allergen Reactions  . Duloxetine Other (See Comments)    Agitation  . Varenicline Other (See Comments)    Agitation  . Duloxetine Hcl Anxiety    Agitation    Family History: Family History  Problem Relation Age of Onset  . Cancer Mother        lung  . Cancer Father        lung  . Cancer Maternal Grandmother        breast  . Scoliosis Brother   . Arthritis Brother   . Uterine cancer Paternal Grandmother   . Bladder Cancer Neg Hx   . Kidney cancer Neg Hx     Social History:  reports that she has been smoking cigarettes. She has a 35.00 pack-year smoking history. She has never used smokeless tobacco. She reports that she does not drink alcohol or use drugs.  ROS: UROLOGY Frequent Urination?: No Hard to postpone urination?: Yes Burning/pain with urination?: No Get up at night to urinate?: Yes Leakage of urine?: Yes Urine stream starts and stops?: No Trouble starting stream?: No Do you have to strain to urinate?: No Blood in urine?: No Urinary tract infection?: No Sexually transmitted disease?: No Injury to kidneys or bladder?: No Painful  intercourse?: No Weak stream?: No Currently pregnant?: No Vaginal bleeding?: No Last menstrual period?: n  Gastrointestinal Nausea?: No Vomiting?: No Indigestion/heartburn?: Yes Diarrhea?: Yes Constipation?: No  Constitutional Fever: No Night sweats?: Yes Weight loss?: No Fatigue?: Yes  Skin Skin rash/lesions?: No Itching?: No  Eyes Blurred vision?: No Double vision?: No  Ears/Nose/Throat Sore throat?: No Sinus problems?: Yes  Hematologic/Lymphatic Swollen glands?: No Easy bruising?: No  Cardiovascular Leg swelling?: Yes Chest pain?: No  Respiratory Cough?: Yes Shortness of breath?: No  Endocrine Excessive thirst?: No  Musculoskeletal Back pain?: Yes Joint pain?: Yes  Neurological Headaches?: No Dizziness?: No  Psychologic Depression?: Yes Anxiety?: Yes  Physical Exam: BP 138/87 (BP Location: Left Arm, Patient Position: Sitting)   Pulse 82   Ht 5\' 3"  (1.6 m)   Wt 88 kg   BMI 34.37 kg/m   Constitutional:  Alert and oriented, No acute distress. HEENT: [place] AT, moist mucus membranes.  Trachea midline, no masses. Cardiovascular: No clubbing, cyanosis, or edema.  Laboratory Data: Lab Results  Component Value Date   WBC 7.4 07/15/2017   HGB 15.5 07/15/2017   HCT 44.7 07/15/2017   MCV 93.6 07/15/2017   PLT 212 07/15/2017    Lab Results  Component Value Date   CREATININE 0.77 07/15/2017    No results found for: PSA  Lab Results  Component Value Date   TESTOSTERONE 19 06/17/2017    No results found for: HGBA1C  Urinalysis    Component Value Date/Time   COLORURINE YELLOW (A) 10/01/2016 1540   APPEARANCEUR Clear 07/26/2017 1531   LABSPEC 1.008 10/01/2016 1540   LABSPEC 1.018 10/18/2012 1346   PHURINE 5.0 10/01/2016 1540   GLUCOSEU Negative 07/26/2017 1531   GLUCOSEU Negative 10/18/2012 1346   HGBUR NEGATIVE 10/01/2016 1540   BILIRUBINUR Negative 07/26/2017 1531   BILIRUBINUR Negative 10/18/2012 1346  KETONESUR NEGATIVE  10/01/2016 1540   PROTEINUR Negative 07/26/2017 1531   PROTEINUR NEGATIVE 10/01/2016 1540   NITRITE Negative 07/26/2017 1531   NITRITE NEGATIVE 10/01/2016 1540   LEUKOCYTESUR Negative 07/26/2017 1531   LEUKOCYTESUR Negative 10/18/2012 1346    Pertinent Imaging:   Assessment & Plan: Samples given process on preauthorization started again since it appears it was not done reassess in about 2 months  There are no diagnoses linked to this encounter.  No follow-ups on file.  Reece Packer, MD  [facility] [address], [place], [place] [address] [phone]

## 2018-09-18 ENCOUNTER — Other Ambulatory Visit: Payer: Self-pay | Admitting: Certified Nurse Midwife

## 2018-09-26 ENCOUNTER — Ambulatory Visit (INDEPENDENT_AMBULATORY_CARE_PROVIDER_SITE_OTHER): Payer: Medicare Other | Admitting: Urology

## 2018-09-26 ENCOUNTER — Encounter: Payer: Self-pay | Admitting: Urology

## 2018-09-26 VITALS — BP 138/85 | Ht 63.0 in | Wt 187.0 lb

## 2018-09-26 DIAGNOSIS — N3946 Mixed incontinence: Secondary | ICD-10-CM | POA: Diagnosis not present

## 2018-09-26 NOTE — Progress Notes (Signed)
09/26/2018 1:57 PM   Bonnie Gomez [birth year] [id number]  Referring provider: Remi Haggard, [place], [place] [address]  No chief complaint on file.   HPI: The patient has mixed incontinence with some improvement on double therapy.Once again the bathroom mapping is what is bothering her the most. She did leak quite a bit when she coughs with smoking but this is not the primary complaint.  Patient has not had urodynamics and had a negative cough test after normal cystoscopy  Last time she was doing very well on double therapy Toviaz and Myrbetriq. The Vesicare with Myrbetriq or without did not work. The Lisbeth Ply unfortunately is now $300 and when she goes on and off it she sees a tremendous difference  Reassess patient in 5 weeks on Detrol LA 4 mg and Myrbetriq. If this fails I will try oxybutynin. If this fails we then will try the preauthorization for the [place] again.  Oddly Detrol was very expensive. She is on oxybutynin ER 10 mg of the Myrbetriq and is helping some. Less urgency.  Patient is nearly completely dry on Toviaz and Myrbetriq.  Once she was dreaming and leaked and once she leak with a fall.  She is improved on oxybutynin but near not as well and the Detrol was expensive apparently.   Today Basically dry on Toviaz and Myrbetriq.  Little bit more urgency for seeing in the morning.  Clinically no infections.  Oxybutynin did not work as well.   PMH: Past Medical History:  Diagnosis Date  . Allergy   . Anxiety   . Arthritis   . Asthma 2001  . COPD (chronic obstructive pulmonary disease) ([facility]) 2013  . Depression   . Emphysema lung ([facility])   . 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 Hiatal hernia   . Hypertension   . Insomnia   . Overactive bladder   . Pneumonia    2013  . Thyroid disease   . Vitamin D deficiency     Surgical History: Past Surgical History:  Procedure Laterality Date  . ANGIOPLASTY  2008,  2012  . CHOLECYSTECTOMY  1985  . COLONOSCOPY  2013  . GYNECOLOGIC CRYOSURGERY  1993  . SKIN BIOPSY  2006  . TUBAL LIGATION  1992  . UPPER GI ENDOSCOPY  2013   [facility]    Home Medications:  Allergies as of 09/26/2018      Reactions   Duloxetine Other (See Comments)   Agitation   Varenicline Other (See Comments)   Agitation   Duloxetine Hcl Anxiety   Agitation      Medication List       Accurate as of September 26, 2018  1:57 PM. Always use your most recent med list.        albuterol (2.5 MG/3ML) 0.083% nebulizer solution Commonly known as:  PROVENTIL INHALE 1 VIAL USING NEBULIZER EVERY SIX HOURS AS NEEDED   amoxicillin-clavulanate 500-125 MG tablet Commonly known as:  AUGMENTIN Take 1 tablet by mouth 3 (three) times daily.   arformoterol 15 MCG/2ML Nebu Commonly known as:  BROVANA Inhale into the lungs.   aspirin EC 81 MG tablet Take 81 mg by mouth every morning.   baclofen 10 MG tablet Commonly known as:  LIORESAL Take 10 mg by mouth 3 (three) times daily as needed for muscle spasms.   BD Integra Syringe 25G X 1" 3 ML Misc Generic drug:  SYRINGE-NEEDLE (DISP) 3 ML USE WITH B12  budesonide 0.5 MG/2ML nebulizer solution Commonly known as:  PULMICORT TAKE 2 MLS (0.5 MG TOTAL) BY NEBULIZATION ONCE DAILY.   Chantix Continuing Month Pak 1 MG tablet Generic drug:  varenicline   clonazePAM 1 MG tablet Commonly known as:  KLONOPIN Take 1 mg by mouth 3 (three) times daily as needed for anxiety.   Daliresp 500 MCG Tabs tablet Generic drug:  roflumilast Take 500 mcg by mouth every evening.   DULoxetine 60 MG capsule Commonly known as:  CYMBALTA   estradiol 0.5 MG tablet Commonly known as:  ESTRACE Take 1 tablet (0.5 mg total) by mouth every morning. For 21 days. Do not take for 1 week, then start again.   estradiol 0.5 MG tablet Commonly known as:  ESTRACE Take 1 tablet (0.5 mg total) by mouth daily.   fentaNYL 50 MCG/HR Commonly known as:  DURAGESIC APPLY  1 [place]. REMOVE OLD PATCH BEFORE APPLYING A NEW ONE.   fesoterodine 8 MG Tb24 tablet Commonly known as:  TOVIAZ Take 1 tablet (8 mg total) by mouth daily.   Fioricet 50-325-40 MG tablet Generic drug:  butalbital-acetaminophen-caffeine Take 1 tablet by mouth every 4 (four) hours as needed for headache.   fluconazole 150 MG tablet Commonly known as:  DIFLUCAN Take 150 mg by mouth daily.   FLUoxetine 40 MG capsule Commonly known as:  PROZAC   furosemide 40 MG tablet Commonly known as:  LASIX Take 40 mg by mouth daily as needed for fluid or edema.   gabapentin 300 MG capsule Commonly known as:  NEURONTIN Take 300 mg by mouth every 6 (six) hours.   hydrochlorothiazide 25 MG tablet Commonly known as:  HYDRODIURIL Take 25 mg by mouth daily.   ketoconazole 2 % cream Commonly known as:  NIZORAL APPLY A SMALL AMOUNT TOPICALLY TWO TIMES A DAY   [place]-Con M10 10 MEQ tablet Generic drug:  potassium chloride Take 10 mEq by mouth every morning.   levothyroxine 50 MCG tablet Commonly known as:  SYNTHROID, LEVOTHROID Take 50 mcg by mouth every morning.   losartan 50 MG tablet Commonly known as:  COZAAR Take 50 mg by mouth every morning.   medroxyPROGESTERone 2.5 MG tablet Commonly known as:  PROVERA Take 1 tablet (2.5 mg total) by mouth daily.   methocarbamol 750 MG tablet Commonly known as:  Robaxin-750 Take 1 tablet (750 mg total) by mouth every 8 (eight) hours as needed for muscle spasms.   mirabegron ER 50 MG Tb24 tablet Commonly known as:  MYRBETRIQ Take 1 tablet (50 mg total) by mouth daily.   mometasone 0.1 % cream Commonly known as:  ELOCON Apply 1 application topically 2 (two) times daily.   mometasone 50 MCG/ACT nasal spray Commonly known as:  NASONEX Place into the nose.   nystatin powder Commonly known as:  MYCOSTATIN/NYSTOP APPLY SMALL AMOUNT TOPICALLY TWO TIMES A DAY AS NEEDED   ondansetron 4 MG disintegrating tablet Commonly known as:   ZOFRAN-ODT   oxyCODONE-acetaminophen 7.5-325 MG tablet Commonly known as:  PERCOCET TAKE 1 TABLET BY MOUTH 4 TIMES A DAY FOR 31 DAYS   pantoprazole 40 MG tablet Commonly known as:  PROTONIX Take 40 mg by mouth daily.   predniSONE 20 MG tablet Commonly known as:  DELTASONE Take 20 mg by mouth daily with breakfast.   tiotropium 18 MCG inhalation capsule Commonly known as:  SPIRIVA Place 18 mcg into inhaler and inhale daily.   valACYclovir 500 MG tablet Commonly known as:  VALTREX Take  500 mg by mouth daily.   Vitamin D3 125 MCG (5000 UT) Caps Take 10,000 Units by mouth every morning.       Allergies:  Allergies  Allergen Reactions  . Duloxetine Other (See Comments)    Agitation  . Varenicline Other (See Comments)    Agitation  . Duloxetine Hcl Anxiety    Agitation    Family History: Family History  Problem Relation Age of Onset  . Cancer Mother        lung  . Cancer Father        lung  . Cancer Maternal Grandmother        breast  . Scoliosis Brother   . Arthritis Brother   . Uterine cancer Paternal Grandmother   . Bladder Cancer Neg Hx   . Kidney cancer Neg Hx     Social History:  reports that she has been smoking cigarettes. She has a 35.00 pack-year smoking history. She has never used smokeless tobacco. She reports that she does not drink alcohol or use drugs.  ROS:                                        Physical Exam: There were no vitals taken for this visit.  Constitutional:  Alert and oriented, No acute distress. HEENT: [place] AT, moist mucus membranes.  Trachea midline, no masses.  Laboratory Data: Lab Results  Component Value Date   WBC 7.4 07/15/2017   HGB 15.5 07/15/2017   HCT 44.7 07/15/2017   MCV 93.6 07/15/2017   PLT 212 07/15/2017    Lab Results  Component Value Date   CREATININE 0.77 07/15/2017    No results found for: PSA  Lab Results  Component Value Date   TESTOSTERONE 19 06/17/2017    No  results found for: HGBA1C  Urinalysis    Component Value Date/Time   COLORURINE YELLOW (A) 10/01/2016 1540   APPEARANCEUR Clear 07/26/2017 1531   LABSPEC 1.008 10/01/2016 1540   LABSPEC 1.018 10/18/2012 1346   PHURINE 5.0 10/01/2016 1540   GLUCOSEU Negative 07/26/2017 1531   GLUCOSEU Negative 10/18/2012 1346   HGBUR NEGATIVE 10/01/2016 1540   BILIRUBINUR Negative 07/26/2017 1531   BILIRUBINUR Negative 10/18/2012 1346   KETONESUR NEGATIVE 10/01/2016 1540   PROTEINUR Negative 07/26/2017 1531   PROTEINUR NEGATIVE 10/01/2016 1540   NITRITE Negative 07/26/2017 1531   NITRITE NEGATIVE 10/01/2016 1540   LEUKOCYTESUR Negative 07/26/2017 1531   LEUKOCYTESUR Negative 10/18/2012 1346    Pertinent Imaging:   Assessment & Plan: It is a shame to offer her refractory therapy when she is doing so well with these medications.  I gave her 3 months of samples and reevaluate her then.  There are no diagnoses linked to this encounter.  No follow-ups on file.  Reece Packer, MD  [facility] [address], [place], [place] [address] [phone]

## 2018-09-28 ENCOUNTER — Other Ambulatory Visit: Payer: Self-pay

## 2018-09-28 MED ORDER — ESTRADIOL 0.5 MG PO TABS: 0.5000 mg | ORAL_TABLET | Freq: Every day | ORAL | 0 refills | Status: AC

## 2018-10-17 ENCOUNTER — Other Ambulatory Visit: Payer: Self-pay | Admitting: Family Medicine

## 2018-10-17 MED ORDER — MIRABEGRON ER 50 MG PO TB24: 50.0000 mg | ORAL_TABLET | Freq: Every day | ORAL | 11 refills | Status: DC

## 2018-11-01 ENCOUNTER — Other Ambulatory Visit: Payer: Self-pay | Admitting: Certified Nurse Midwife

## 2018-12-26 ENCOUNTER — Other Ambulatory Visit: Payer: Self-pay

## 2018-12-26 ENCOUNTER — Encounter: Payer: Self-pay | Admitting: Urology

## 2018-12-26 ENCOUNTER — Other Ambulatory Visit: Payer: Self-pay | Admitting: Family Medicine

## 2018-12-26 ENCOUNTER — Ambulatory Visit (INDEPENDENT_AMBULATORY_CARE_PROVIDER_SITE_OTHER): Payer: Medicare Other | Admitting: Urology

## 2018-12-26 VITALS — BP 121/77 | HR 97 | Ht 63.0 in | Wt 195.0 lb

## 2018-12-26 DIAGNOSIS — N3946 Mixed incontinence: Secondary | ICD-10-CM | POA: Diagnosis not present

## 2018-12-26 DIAGNOSIS — Z1231 Encounter for screening mammogram for malignant neoplasm of breast: Secondary | ICD-10-CM

## 2018-12-26 NOTE — Progress Notes (Signed)
12/26/2018 3:16 PM   Bonnie Gomez 1965-08-20 [id number]  Referring provider: Remi Haggard, [place], [place] [address]  Chief Complaint  Patient presents with  . Urinary Incontinence    follow up    HPI:  The patient has mixed incontinence with some improvement on double therapy.Once again the bathroom mapping is what is bothering her the most. She did leak quite a bit when she coughs with smoking but this is not the primary complaint.Patient has not had urodynamics and had a negative cough test after normal cystoscopy  Last time she was doing very well on double therapy Toviaz and Myrbetriq. The Vesicare with Myrbetriq or without did not work. The Lisbeth Ply unfortunately is now $300 and when she goes on and off it she sees a tremendous difference  Reassess patient in 5 weeks on Detrol LA 4 mg and Myrbetriq. If this fails I will try oxybutynin. If this fails we then will try the preauthorization for the [place] again.  Oddly Detrol was very expensive. She is on oxybutynin ER 10 mg of the Myrbetriq and is helping some. Less urgency.  Patient is nearly completely dry on Toviaz and Myrbetriq. Once she was dreaming and leaked and once she leak with a fall. She is improved on oxybutynin but near not as well and the Detrol was expensive apparently.   Today Basically dry on Toviaz and Myrbetriq.  Little bit more urgency for seeing in the morning.  Clinically no infections.  Oxybutynin did not work as well.  Thought it was a shame to offer neuromodulation therapy so I gave her 3 months of samples last time  Day Frequency stable Patient is dramatically better on combination treatment.  Otherwise she leaks and does a lot of bathroom mapping.  I teased her that we can no longer continue to try to do the paperwork that were supposed to do as she puts it.  We try to get this preauthorization for ever and I think we have failed.  I gave her 3 months of  samples and I will see her in 3 months.  1 of the months was at 4 mg due to sample issue   PMH: Past Medical History:  Diagnosis Date  . Allergy   . Anxiety   . Arthritis   . Asthma 2001  . COPD (chronic obstructive pulmonary disease) ([facility]) 2013  . Depression   . Emphysema lung ([place])   . 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 Hiatal hernia   . Hypertension   . Insomnia   . Overactive bladder   . Pneumonia    2013  . Thyroid disease   . Vitamin D deficiency     Surgical History: Past Surgical History:  Procedure Laterality Date  . ANGIOPLASTY  2008, 2012  . CHOLECYSTECTOMY  1985  . COLONOSCOPY  2013  . GYNECOLOGIC CRYOSURGERY  1993  . SKIN BIOPSY  2006  . TUBAL LIGATION  1992  . UPPER GI ENDOSCOPY  2013   [facility]    Home Medications:  Allergies as of 12/26/2018      Reactions   Duloxetine Other (See Comments)   Agitation   Varenicline Other (See Comments)   Agitation   Duloxetine Hcl Anxiety   Agitation      Medication List       Accurate as of December 26, 2018  3:16 PM. If you have any questions, ask your nurse or doctor.  STOP taking these medications   amoxicillin-clavulanate 500-125 MG tablet Commonly known as:  AUGMENTIN Stopped by:  Reece Packer, MD   hydrochlorothiazide 25 MG tablet Commonly known as:  HYDRODIURIL Stopped by:  Reece Packer, MD   Klor-Con M10 10 MEQ tablet Generic drug:  potassium chloride Stopped by:  Reece Packer, MD   levothyroxine 50 MCG tablet Commonly known as:  SYNTHROID Stopped by:  Reece Packer, MD   losartan 50 MG tablet Commonly known as:  COZAAR Stopped by:  Reece Packer, MD   methocarbamol 750 MG tablet Commonly known as:  Robaxin-750 Stopped by:  Reece Packer, MD   ondansetron 4 MG disintegrating tablet Commonly known as:  ZOFRAN-ODT Stopped by:  Reece Packer, MD   predniSONE 20 MG tablet Commonly known as:  DELTASONE Stopped by:  Reece Packer, MD     TAKE these medications   albuterol (2.5 MG/3ML) 0.083% nebulizer solution Commonly known as:  PROVENTIL INHALE 1 VIAL USING NEBULIZER EVERY SIX HOURS AS NEEDED   arformoterol 15 MCG/2ML Nebu Commonly known as:  BROVANA Inhale into the lungs.   aspirin EC 81 MG tablet Take 81 mg by mouth every morning.   baclofen 10 MG tablet Commonly known as:  LIORESAL Take 10 mg by mouth 3 (three) times daily as needed for muscle spasms.   BD Integra Syringe 25G X 1" 3 ML Misc Generic drug:  SYRINGE-NEEDLE (DISP) 3 ML USE WITH B12   budesonide 0.5 MG/2ML nebulizer solution Commonly known as:  PULMICORT TAKE 2 MLS (0.5 MG TOTAL) BY NEBULIZATION ONCE DAILY.   Chantix Continuing Month Pak 1 MG tablet Generic drug:  varenicline   clonazePAM 1 MG tablet Commonly known as:  KLONOPIN Take 1 mg by mouth 3 (three) times daily as needed for anxiety.   Daliresp 500 MCG Tabs tablet Generic drug:  roflumilast Take 500 mcg by mouth every evening.   DULoxetine 60 MG capsule Commonly known as:  CYMBALTA   estradiol 0.5 MG tablet Commonly known as:  ESTRACE Take 1 tablet (0.5 mg total) by mouth every morning. For 21 days. Do not take for 1 week, then start again.   estradiol 0.5 MG tablet Commonly known as:  ESTRACE Take 1 tablet (0.5 mg total) by mouth daily. PATIENT WILL NEED AN APPOINTMENT BEFORE ANY FURTHER REFILLS GIVEN   fentaNYL 50 MCG/HR Commonly known as:  DURAGESIC APPLY 1 [place]. REMOVE OLD PATCH BEFORE APPLYING A NEW ONE.   fesoterodine 8 MG Tb24 tablet Commonly known as:  TOVIAZ Take 1 tablet (8 mg total) by mouth daily.   Fioricet 50-325-40 MG tablet Generic drug:  butalbital-acetaminophen-caffeine Take 1 tablet by mouth every 4 (four) hours as needed for headache.   fluconazole 150 MG tablet Commonly known as:  DIFLUCAN Take 150 mg by mouth daily.   FLUoxetine 40 MG capsule Commonly known as:  PROZAC   furosemide 40 MG tablet  Commonly known as:  LASIX Take 40 mg by mouth daily as needed for fluid or edema.   gabapentin 300 MG capsule Commonly known as:  NEURONTIN Take 300 mg by mouth every 6 (six) hours.   ketoconazole 2 % cream Commonly known as:  NIZORAL APPLY A SMALL AMOUNT TOPICALLY TWO TIMES A DAY   medroxyPROGESTERone 2.5 MG tablet Commonly known as:  PROVERA Take 1 tablet (2.5 mg total) by mouth daily.   mirabegron ER 50 MG Tb24 tablet Commonly known as:  MYRBETRIQ  Take 1 tablet (50 mg total) by mouth daily.   mometasone 0.1 % cream Commonly known as:  ELOCON Apply 1 application topically 2 (two) times daily.   mometasone 50 MCG/ACT nasal spray Commonly known as:  NASONEX Place into the nose.   nystatin powder Commonly known as:  MYCOSTATIN/NYSTOP APPLY SMALL AMOUNT TOPICALLY TWO TIMES A DAY AS NEEDED   oxyCODONE-acetaminophen 7.5-325 MG tablet Commonly known as:  PERCOCET TAKE 1 TABLET BY MOUTH 4 TIMES A DAY FOR 31 DAYS   pantoprazole 40 MG tablet Commonly known as:  PROTONIX Take 40 mg by mouth daily.   terbinafine 250 MG tablet Commonly known as:  LAMISIL Take 250 mg by mouth daily.   tiotropium 18 MCG inhalation capsule Commonly known as:  SPIRIVA Place 18 mcg into inhaler and inhale daily.   valACYclovir 500 MG tablet Commonly known as:  VALTREX Take 500 mg by mouth daily.   Vitamin D3 125 MCG (5000 UT) Caps Take 10,000 Units by mouth every morning.       Allergies:  Allergies  Allergen Reactions  . Duloxetine Other (See Comments)    Agitation  . Varenicline Other (See Comments)    Agitation  . Duloxetine Hcl Anxiety    Agitation    Family History: Family History  Problem Relation Age of Onset  . Cancer Mother        lung  . Cancer Father        lung  . Cancer Maternal Grandmother        breast  . Scoliosis Brother   . Arthritis Brother   . Uterine cancer Paternal Grandmother   . Bladder Cancer Neg Hx   . Kidney cancer Neg Hx     Social  History:  reports that she has been smoking cigarettes. She has a 35.00 pack-year smoking history. She has never used smokeless tobacco. She reports that she does not drink alcohol or use drugs.  ROS: UROLOGY Frequent Urination?: No Hard to postpone urination?: Yes Burning/pain with urination?: No Get up at night to urinate?: Yes Leakage of urine?: Yes Urine stream starts and stops?: No Trouble starting stream?: No Do you have to strain to urinate?: No Blood in urine?: No Urinary tract infection?: No Sexually transmitted disease?: Yes Injury to kidneys or bladder?: No Painful intercourse?: No Weak stream?: No Currently pregnant?: No Vaginal bleeding?: No Last menstrual period?: n  Gastrointestinal Nausea?: No Vomiting?: No Indigestion/heartburn?: Yes Diarrhea?: Yes Constipation?: No  Constitutional Fever: No Night sweats?: Yes Weight loss?: No Fatigue?: Yes  Skin Skin rash/lesions?: No Itching?: No  Eyes Blurred vision?: No Double vision?: No  Ears/Nose/Throat Sore throat?: No Sinus problems?: No  Hematologic/Lymphatic Swollen glands?: No Easy bruising?: No  Cardiovascular Leg swelling?: No Chest pain?: No  Respiratory Cough?: No Shortness of breath?: No  Endocrine Excessive thirst?: No  Musculoskeletal Back pain?: Yes Joint pain?: Yes  Neurological Headaches?: No Dizziness?: No  Psychologic Depression?: No Anxiety?: No  Physical Exam: BP 121/77   Pulse 97   Ht 5\' 3"  (1.6 m)   Wt 195 lb (88.5 kg)   BMI 34.54 kg/m   Constitutional:  Alert and oriented, No acute distress.  Laboratory Data: Lab Results  Component Value Date   WBC 7.4 07/15/2017   HGB 15.5 07/15/2017   HCT 44.7 07/15/2017   MCV 93.6 07/15/2017   PLT 212 07/15/2017    Lab Results  Component Value Date   CREATININE 0.77 07/15/2017    No results found for:  PSA  Lab Results  Component Value Date   TESTOSTERONE 19 06/17/2017    No results found for:  HGBA1C  Urinalysis    Component Value Date/Time   COLORURINE YELLOW (A) 10/01/2016 1540   APPEARANCEUR Clear 07/26/2017 1531   LABSPEC 1.008 10/01/2016 1540   LABSPEC 1.018 10/18/2012 1346   PHURINE 5.0 10/01/2016 1540   GLUCOSEU Negative 07/26/2017 1531   GLUCOSEU Negative 10/18/2012 1346   HGBUR NEGATIVE 10/01/2016 1540   BILIRUBINUR Negative 07/26/2017 1531   BILIRUBINUR Negative 10/18/2012 1346   KETONESUR NEGATIVE 10/01/2016 1540   PROTEINUR Negative 07/26/2017 1531   PROTEINUR NEGATIVE 10/01/2016 1540   NITRITE Negative 07/26/2017 1531   NITRITE NEGATIVE 10/01/2016 1540   LEUKOCYTESUR Negative 07/26/2017 1531   LEUKOCYTESUR Negative 10/18/2012 1346    Pertinent Imaging:   Assessment & Plan: Reassess 3 months  There are no diagnoses linked to this encounter.  No follow-ups on file.  Reece Packer, MD  [facility] [address], [place], [place] [address] [phone]

## 2019-01-27 ENCOUNTER — Other Ambulatory Visit
Admission: RE | Admit: 2019-01-27 | Discharge: 2019-01-27 | Disposition: A | Discharge status: Home / Self Care | Payer: Medicare Other | Source: Ambulatory Visit | Attending: Internal Medicine | Admitting: Internal Medicine

## 2019-02-01 ENCOUNTER — Ambulatory Visit: Admission: RE | Admit: 2019-02-01 | Payer: Medicare Other | Source: Home / Self Care | Admitting: Internal Medicine

## 2019-02-01 ENCOUNTER — Encounter: Admission: RE | Payer: Self-pay | Source: Home / Self Care

## 2019-02-01 SURGERY — ESOPHAGOGASTRODUODENOSCOPY (EGD) WITH PROPOFOL
Anesthesia: General

## 2019-02-07 ENCOUNTER — Ambulatory Visit
Admission: RE | Admit: 2019-02-07 | Discharge: 2019-02-07 | Disposition: A | Discharge status: Home / Self Care | Payer: Medicare Other | Source: Ambulatory Visit | Attending: Family Medicine | Admitting: Family Medicine

## 2019-02-07 DIAGNOSIS — Z1231 Encounter for screening mammogram for malignant neoplasm of breast: Secondary | ICD-10-CM | POA: Insufficient documentation

## 2019-02-11 ENCOUNTER — Other Ambulatory Visit: Payer: Self-pay | Admitting: Certified Nurse Midwife

## 2019-04-03 ENCOUNTER — Encounter: Payer: Self-pay | Admitting: Urology

## 2019-04-03 ENCOUNTER — Ambulatory Visit (INDEPENDENT_AMBULATORY_CARE_PROVIDER_SITE_OTHER): Payer: Medicare Other | Admitting: Urology

## 2019-04-03 ENCOUNTER — Other Ambulatory Visit: Payer: Self-pay

## 2019-04-03 VITALS — BP 131/80 | HR 96 | Ht 63.0 in | Wt 192.0 lb

## 2019-04-03 DIAGNOSIS — N3946 Mixed incontinence: Secondary | ICD-10-CM | POA: Diagnosis not present

## 2019-04-03 NOTE — Progress Notes (Signed)
04/03/2019 3:57 PM   Bonnie Gomez Jul 28, 1965 [id number]  Referring provider: Remi Haggard, [place],  [place] [address]  No chief complaint on file.   HPI: The patient has mixed incontinence with some improvement on double therapy.Once again the bathroom mapping is what is bothering her the most. She did leak quite a bit when she coughs with smoking but this is not the primary complaint.Patient has not had urodynamics and had a negative cough test after normal cystoscopy  Last time she was doing very well on double therapy Toviaz and Myrbetriq. The Vesicare with Myrbetriq or without did not work. The Lisbeth Ply unfortunately is now $300 and when she goes on and off it she sees a tremendous difference  Reassess patient in 5 weeks on Detrol LA 4 mg and Myrbetriq. If this fails I will try oxybutynin. If this fails we then will try the preauthorization for the [place] again.  Oddly Detrol was very expensive. She is on oxybutynin ER 10 mg of the Myrbetriq and is helping some. Less urgency.  Patient is nearly completely dry on Toviaz and Myrbetriq. Once she was dreaming and leaked and once she leak with a fall. She is improved on oxybutynin but near not as well and the Detrol was expensive apparently.   Basically dry on Toviaz and Myrbetriq.  Little bit more urgency for seeing in the morning.  Clinically no infections.  Oxybutynin did not work as well.  It is a shame to offer her refractory therapy when she is doing so well with these medications.  I gave her 3 months of samples and reevaluate her then.  Today Frequency stable.  Almost completely dry.  Few small leaks.  Clinically not infected.  3 months of samples given.  Stay on [place] with Myrbetriq samples   PMH: Past Medical History:  Diagnosis Date  . Allergy   . Anxiety   . Arthritis   . Asthma 2001  . COPD (chronic obstructive pulmonary disease) ([facility]) 2013  . Depression   .  Emphysema lung ([facility])   . 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 Hiatal hernia   . Hypertension   . Insomnia   . Overactive bladder   . Pneumonia    2013  . Thyroid disease   . Vitamin D deficiency     Surgical History: Past Surgical History:  Procedure Laterality Date  . ANGIOPLASTY  2008, 2012  . CHOLECYSTECTOMY  1985  . COLONOSCOPY  2013  . GYNECOLOGIC CRYOSURGERY  1993  . SKIN BIOPSY  2006  . TUBAL LIGATION  1992  . UPPER GI ENDOSCOPY  2013   [facility]    Home Medications:  Allergies as of 04/03/2019      Reactions   Duloxetine Other (See Comments)   Agitation   Varenicline Other (See Comments)   Agitation   Duloxetine Hcl Anxiety   Agitation      Medication List       Accurate as of April 03, 2019  3:57 PM. If you have any questions, ask your nurse or doctor.        albuterol (2.5 MG/3ML) 0.083% nebulizer solution Commonly known as: PROVENTIL INHALE 1 VIAL USING NEBULIZER EVERY SIX HOURS AS NEEDED   arformoterol 15 MCG/2ML Nebu Commonly known as: BROVANA Inhale into the lungs.   aspirin EC 81 MG tablet Take 81 mg by mouth every morning.   baclofen 10 MG tablet Commonly known as:  LIORESAL Take 10 mg by mouth 3 (three) times daily as needed for muscle spasms.   BD Integra Syringe 25G X 1" 3 ML Misc Generic drug: SYRINGE-NEEDLE (DISP) 3 ML USE WITH B12   budesonide 0.5 MG/2ML nebulizer solution Commonly known as: PULMICORT TAKE 2 MLS (0.5 MG TOTAL) BY NEBULIZATION ONCE DAILY.   Chantix Continuing Month Pak 1 MG tablet Generic drug: varenicline   clonazePAM 1 MG tablet Commonly known as: KLONOPIN Take 1 mg by mouth 3 (three) times daily as needed for anxiety.   Daliresp 500 MCG Tabs tablet Generic drug: roflumilast Take 500 mcg by mouth every evening.   DULoxetine 60 MG capsule Commonly known as: CYMBALTA   estradiol 0.5 MG tablet Commonly known as: ESTRACE Take 1 tablet (0.5 mg total) by mouth every morning. For 21 days.  Do not take for 1 week, then start again.   estradiol 0.5 MG tablet Commonly known as: ESTRACE Take 1 tablet (0.5 mg total) by mouth daily. PATIENT WILL NEED AN APPOINTMENT BEFORE ANY FURTHER REFILLS GIVEN   fentaNYL 50 MCG/HR Commonly known as: DURAGESIC APPLY 1 [employer]. REMOVE OLD PATCH BEFORE APPLYING A NEW ONE.   fesoterodine 8 MG Tb24 tablet Commonly known as: TOVIAZ Take 1 tablet (8 mg total) by mouth daily.   Fioricet 50-325-40 MG tablet Generic drug: butalbital-acetaminophen-caffeine Take 1 tablet by mouth every 4 (four) hours as needed for headache.   fluconazole 150 MG tablet Commonly known as: DIFLUCAN Take 150 mg by mouth daily.   FLUoxetine 40 MG capsule Commonly known as: PROZAC   furosemide 40 MG tablet Commonly known as: LASIX Take 40 mg by mouth daily as needed for fluid or edema.   gabapentin 300 MG capsule Commonly known as: NEURONTIN Take 300 mg by mouth every 6 (six) hours.   ketoconazole 2 % cream Commonly known as: NIZORAL APPLY A SMALL AMOUNT TOPICALLY TWO TIMES A DAY   medroxyPROGESTERone 2.5 MG tablet Commonly known as: PROVERA Take 1 tablet (2.5 mg total) by mouth daily.   mirabegron ER 50 MG Tb24 tablet Commonly known as: MYRBETRIQ Take 1 tablet (50 mg total) by mouth daily.   mometasone 0.1 % cream Commonly known as: ELOCON Apply 1 application topically 2 (two) times daily.   mometasone 50 MCG/ACT nasal spray Commonly known as: NASONEX Place into the nose.   nystatin powder Commonly known as: MYCOSTATIN/NYSTOP APPLY SMALL AMOUNT TOPICALLY TWO TIMES A DAY AS NEEDED   oxyCODONE-acetaminophen 7.5-325 MG tablet Commonly known as: PERCOCET TAKE 1 TABLET BY MOUTH 4 TIMES A DAY FOR 31 DAYS   pantoprazole 40 MG tablet Commonly known as: PROTONIX Take 40 mg by mouth daily.   terbinafine 250 MG tablet Commonly known as: LAMISIL Take 250 mg by mouth daily.   tiotropium 18 MCG inhalation capsule Commonly known as:  SPIRIVA Place 18 mcg into inhaler and inhale daily.   valACYclovir 500 MG tablet Commonly known as: VALTREX Take 500 mg by mouth daily.   Vitamin D3 125 MCG (5000 UT) Caps Take 10,000 Units by mouth every morning.       Allergies:  Allergies  Allergen Reactions  . Duloxetine Other (See Comments)    Agitation  . Varenicline Other (See Comments)    Agitation  . Duloxetine Hcl Anxiety    Agitation    Family History: Family History  Problem Relation Age of Onset  . Cancer Mother        lung  . Cancer Father  lung  . Cancer Maternal Grandmother        breast  . Breast cancer Maternal Grandmother   . Scoliosis Brother   . Arthritis Brother   . Uterine cancer Paternal Grandmother   . Breast cancer Cousin   . Bladder Cancer Neg Hx   . Kidney cancer Neg Hx     Social History:  reports that she has been smoking cigarettes. She has a 35.00 pack-year smoking history. She has never used smokeless tobacco. She reports that she does not drink alcohol or use drugs.  ROS:                                        Physical Exam: There were no vitals taken for this visit.   Laboratory Data: Lab Results  Component Value Date   WBC 7.4 07/15/2017   HGB 15.5 07/15/2017   HCT 44.7 07/15/2017   MCV 93.6 07/15/2017   PLT 212 07/15/2017    Lab Results  Component Value Date   CREATININE 0.77 07/15/2017    No results found for: PSA  Lab Results  Component Value Date   TESTOSTERONE 19 06/17/2017    No results found for: HGBA1C  Urinalysis    Component Value Date/Time   COLORURINE YELLOW (A) 10/01/2016 1540   APPEARANCEUR Clear 07/26/2017 1531   LABSPEC 1.008 10/01/2016 1540   LABSPEC 1.018 10/18/2012 1346   PHURINE 5.0 10/01/2016 1540   GLUCOSEU Negative 07/26/2017 1531   GLUCOSEU Negative 10/18/2012 1346   HGBUR NEGATIVE 10/01/2016 1540   BILIRUBINUR Negative 07/26/2017 1531   BILIRUBINUR Negative 10/18/2012 1346   KETONESUR  NEGATIVE 10/01/2016 1540   PROTEINUR Negative 07/26/2017 1531   PROTEINUR NEGATIVE 10/01/2016 1540   NITRITE Negative 07/26/2017 1531   NITRITE NEGATIVE 10/01/2016 1540   LEUKOCYTESUR Negative 07/26/2017 1531   LEUKOCYTESUR Negative 10/18/2012 1346    Pertinent Imaging:   Assessment & Plan: Reassess 3 months  There are no diagnoses linked to this encounter.  No follow-ups on file.  Reece Packer, MD  [facility] [address], [place], [place] [address] [phone]

## 2019-04-20 ENCOUNTER — Emergency Department
Admission: EM | Admit: 2019-04-20 | Discharge: 2019-04-20 | Disposition: A | Discharge status: Home / Self Care | Payer: Medicare Other | Attending: Emergency Medicine | Admitting: Emergency Medicine

## 2019-04-20 ENCOUNTER — Other Ambulatory Visit: Payer: Self-pay

## 2019-04-20 ENCOUNTER — Emergency Department: Payer: Medicare Other

## 2019-04-20 ENCOUNTER — Encounter: Payer: Self-pay | Admitting: Emergency Medicine

## 2019-04-20 DIAGNOSIS — R519 Headache, unspecified: Secondary | ICD-10-CM | POA: Diagnosis not present

## 2019-04-20 DIAGNOSIS — Y9389 Activity, other specified: Secondary | ICD-10-CM | POA: Diagnosis not present

## 2019-04-20 DIAGNOSIS — Z7951 Long term (current) use of inhaled steroids: Secondary | ICD-10-CM | POA: Diagnosis not present

## 2019-04-20 DIAGNOSIS — F419 Anxiety disorder, unspecified: Secondary | ICD-10-CM | POA: Diagnosis not present

## 2019-04-20 DIAGNOSIS — M545 Low back pain: Secondary | ICD-10-CM | POA: Insufficient documentation

## 2019-04-20 DIAGNOSIS — I10 Essential (primary) hypertension: Secondary | ICD-10-CM | POA: Insufficient documentation

## 2019-04-20 DIAGNOSIS — J449 Chronic obstructive pulmonary disease, unspecified: Secondary | ICD-10-CM | POA: Diagnosis not present

## 2019-04-20 DIAGNOSIS — M542 Cervicalgia: Secondary | ICD-10-CM | POA: Diagnosis not present

## 2019-04-20 DIAGNOSIS — Z888 Allergy status to other drugs, medicaments and biological substances status: Secondary | ICD-10-CM | POA: Insufficient documentation

## 2019-04-20 DIAGNOSIS — Z79899 Other long term (current) drug therapy: Secondary | ICD-10-CM | POA: Diagnosis not present

## 2019-04-20 DIAGNOSIS — Z7982 Long term (current) use of aspirin: Secondary | ICD-10-CM | POA: Diagnosis not present

## 2019-04-20 DIAGNOSIS — Y999 Unspecified external cause status: Secondary | ICD-10-CM | POA: Insufficient documentation

## 2019-04-20 DIAGNOSIS — J45909 Unspecified asthma, uncomplicated: Secondary | ICD-10-CM | POA: Insufficient documentation

## 2019-04-20 DIAGNOSIS — F17210 Nicotine dependence, cigarettes, uncomplicated: Secondary | ICD-10-CM | POA: Diagnosis not present

## 2019-04-20 DIAGNOSIS — Y92410 Unspecified street and highway as the place of occurrence of the external cause: Secondary | ICD-10-CM | POA: Insufficient documentation

## 2019-04-20 DIAGNOSIS — E079 Disorder of thyroid, unspecified: Secondary | ICD-10-CM | POA: Diagnosis not present

## 2019-04-20 MED ORDER — METHOCARBAMOL 500 MG PO TABS: 500.0000 mg | ORAL_TABLET | Freq: Three times a day (TID) | ORAL | 0 refills | Status: AC | PRN

## 2019-04-20 MED ORDER — MELOXICAM 15 MG PO TABS: 15.0000 mg | ORAL_TABLET | Freq: Every day | ORAL | 1 refills | Status: AC
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
## 2019-07-03 ENCOUNTER — Other Ambulatory Visit: Payer: Self-pay

## 2019-07-03 ENCOUNTER — Encounter: Payer: Self-pay | Admitting: Urology

## 2019-07-03 ENCOUNTER — Ambulatory Visit (INDEPENDENT_AMBULATORY_CARE_PROVIDER_SITE_OTHER): Payer: Medicare Other | Admitting: Urology

## 2019-07-03 VITALS — BP 144/76 | HR 94 | Ht 62.0 in | Wt 180.0 lb

## 2019-07-03 DIAGNOSIS — N3946 Mixed incontinence: Secondary | ICD-10-CM | POA: Diagnosis not present

## 2019-07-03 MED ORDER — MIRABEGRON ER 50 MG PO TB24: 50.0000 mg | ORAL_TABLET | Freq: Every day | ORAL | 3 refills | Status: DC

## 2019-07-03 NOTE — Progress Notes (Signed)
07/03/2019 3:04 PM   Bonnie Gomez Dec 16, 1965 [id number]  Referring provider: Remi Haggard, [address] [place],  [place] [id number]  Chief Complaint  Patient presents with  . Follow-up    HPI: The patient has mixed incontinence with some improvement on double therapy.Once again the bathroom mapping is what is bothering her the most. She did leak quite a bit when she coughs with smoking but this is not the primary complaint.Patient has not had urodynamics and had a negative cough test after normal cystoscopy  Last time she was doing very well on double therapy Toviaz and Myrbetriq. The Vesicare with Myrbetriq or without did not work. The Lisbeth Ply unfortunately is now $300 and when she goes on and off it she sees a tremendous difference  Oddly Detrol was very expensive. She is on oxybutynin ER 10 mg of the Myrbetriq and is helping some.   Patient is nearly completely dry on Toviaz and Myrbetriq. Once she was dreaming and leaked and once she leak with a fall. She is improved on oxybutynin but near not as well and the Detrol was expensive apparently.  Basically dry on Toviaz and Myrbetriq. Little bit more urgency for seeing in the morning. Clinically no infections. Oxybutynin did not work as well.  It is a shame to offer her refractory therapy when she is doing so well with these medications. I gave her 3 months of samples and reevaluate her then.   Almost completely dry.  Few small leaks.  Clinically not infected.  3 months of samples given.  Stay on [place] with Myrbetriq samples  Today Frequency stable.  Urge incontinence a bit worse with foot on the floor syndrome first in the morning.  Sometimes she leaks when she is out shopping.  I do not think she has an infection but sent for culture.  Modifying factors: There are no other modifying factors  Associated signs and symptoms: There are no other associated signs and symptoms Aggravating and  relieving factors: There are no other aggravating or relieving factors Severity: Moderate Duration: Persistent   PMH: Past Medical History:  Diagnosis Date  . Allergy   . Anxiety   . Arthritis   . Asthma 2001  . COPD (chronic obstructive pulmonary disease) ([place]) 2013  . Depression   . Emphysema lung ([employer])   . 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 Hiatal hernia   . Hypertension   . Insomnia   . Overactive bladder   . Pneumonia    2013  . Thyroid disease   . Vitamin D deficiency     Surgical History: Past Surgical History:  Procedure Laterality Date  . ANGIOPLASTY  2008, 2012  . CHOLECYSTECTOMY  1985  . COLONOSCOPY  2013  . GYNECOLOGIC CRYOSURGERY  1993  . SKIN BIOPSY  2006  . TUBAL LIGATION  1992  . UPPER GI ENDOSCOPY  2013   [place]    Home Medications:  Allergies as of 07/03/2019      Reactions   Duloxetine Other (See Comments)   Agitation   Varenicline Other (See Comments)   Agitation   Duloxetine Hcl Anxiety   Agitation      Medication List       Accurate as of July 03, 2019  3:04 PM. If you have any questions, ask your nurse or doctor.        albuterol (2.5 MG/3ML) 0.083% nebulizer solution Commonly known as: PROVENTIL INHALE 1 VIAL USING  NEBULIZER EVERY SIX HOURS AS NEEDED   arformoterol 15 MCG/2ML Nebu Commonly known as: BROVANA Inhale into the lungs.   aspirin EC 81 MG tablet Take 81 mg by mouth every morning.   baclofen 10 MG tablet Commonly known as: LIORESAL Take 10 mg by mouth 3 (three) times daily as needed for muscle spasms.   BD Integra Syringe 25G X 1" 3 ML Misc Generic drug: SYRINGE-NEEDLE (DISP) 3 ML USE WITH B12   budesonide 0.5 MG/2ML nebulizer solution Commonly known as: PULMICORT TAKE 2 MLS (0.5 MG TOTAL) BY NEBULIZATION ONCE DAILY.   Chantix Continuing Month Pak 1 MG tablet Generic drug: varenicline   clonazePAM 1 MG tablet Commonly known as: KLONOPIN Take 1 mg by mouth 3 (three) times daily  as needed for anxiety.   Daliresp 500 MCG Tabs tablet Generic drug: roflumilast Take 500 mcg by mouth every evening.   DULoxetine 60 MG capsule Commonly known as: CYMBALTA   estradiol 0.5 MG tablet Commonly known as: ESTRACE Take 1 tablet (0.5 mg total) by mouth every morning. For 21 days. Do not take for 1 week, then start again.   estradiol 0.5 MG tablet Commonly known as: ESTRACE Take 1 tablet (0.5 mg total) by mouth daily. PATIENT WILL NEED AN APPOINTMENT BEFORE ANY FURTHER REFILLS GIVEN   fentaNYL 50 MCG/HR Commonly known as: DURAGESIC APPLY 1 [place]. REMOVE OLD PATCH BEFORE APPLYING A NEW ONE.   fesoterodine 8 MG Tb24 tablet Commonly known as: TOVIAZ Take 1 tablet (8 mg total) by mouth daily.   Fioricet 50-325-40 MG tablet Generic drug: butalbital-acetaminophen-caffeine Take 1 tablet by mouth every 4 (four) hours as needed for headache.   fluconazole 150 MG tablet Commonly known as: DIFLUCAN Take 150 mg by mouth daily.   FLUoxetine 40 MG capsule Commonly known as: PROZAC   furosemide 40 MG tablet Commonly known as: LASIX Take 40 mg by mouth daily as needed for fluid or edema.   gabapentin 300 MG capsule Commonly known as: NEURONTIN Take 300 mg by mouth every 6 (six) hours.   ketoconazole 2 % cream Commonly known as: NIZORAL APPLY A SMALL AMOUNT TOPICALLY TWO TIMES A DAY   medroxyPROGESTERone 2.5 MG tablet Commonly known as: PROVERA Take 1 tablet (2.5 mg total) by mouth daily.   mirabegron ER 50 MG Tb24 tablet Commonly known as: MYRBETRIQ Take 1 tablet (50 mg total) by mouth daily.   mometasone 0.1 % cream Commonly known as: ELOCON Apply 1 application topically 2 (two) times daily.   mometasone 50 MCG/ACT nasal spray Commonly known as: NASONEX Place into the nose.   nystatin powder Commonly known as: MYCOSTATIN/NYSTOP APPLY SMALL AMOUNT TOPICALLY TWO TIMES A DAY AS NEEDED   oxyCODONE-acetaminophen 7.5-325 MG tablet Commonly known  as: PERCOCET TAKE 1 TABLET BY MOUTH 4 TIMES A DAY FOR 31 DAYS   pantoprazole 40 MG tablet Commonly known as: PROTONIX Take 40 mg by mouth daily.   terbinafine 250 MG tablet Commonly known as: LAMISIL Take 250 mg by mouth daily.   tiotropium 18 MCG inhalation capsule Commonly known as: SPIRIVA Place 18 mcg into inhaler and inhale daily.   valACYclovir 500 MG tablet Commonly known as: VALTREX Take 500 mg by mouth daily.   Vitamin D3 125 MCG (5000 UT) Caps Take 10,000 Units by mouth every morning.       Allergies:  Allergies  Allergen Reactions  . Duloxetine Other (See Comments)    Agitation  . Varenicline Other (See Comments)  Agitation  . Duloxetine Hcl Anxiety    Agitation    Family History: Family History  Problem Relation Age of Onset  . Cancer Mother        lung  . Cancer Father        lung  . Cancer Maternal Grandmother        breast  . Breast cancer Maternal Grandmother   . Scoliosis Brother   . Arthritis Brother   . Uterine cancer Paternal Grandmother   . Breast cancer Cousin   . Bladder Cancer Neg Hx   . Kidney cancer Neg Hx     Social History:  reports that she has been smoking cigarettes. She has a 35.00 pack-year smoking history. She has never used smokeless tobacco. She reports that she does not drink alcohol or use drugs.  ROS: UROLOGY Frequent Urination?: Yes Hard to postpone urination?: No Burning/pain with urination?: No Get up at night to urinate?: Yes Leakage of urine?: Yes Urine stream starts and stops?: No Trouble starting stream?: No Do you have to strain to urinate?: No Blood in urine?: No Urinary tract infection?: No Sexually transmitted disease?: No Injury to kidneys or bladder?: No Painful intercourse?: No Weak stream?: No Currently pregnant?: No Vaginal bleeding?: No Last menstrual period?: N  Gastrointestinal Nausea?: No Vomiting?: No Indigestion/heartburn?: No Diarrhea?: No Constipation?:  No  Constitutional Fever: No Night sweats?: No Weight loss?: No Fatigue?: No  Skin Skin rash/lesions?: No Itching?: No  Eyes Blurred vision?: No Double vision?: No  Ears/Nose/Throat Sore throat?: No Sinus problems?: No  Hematologic/Lymphatic Swollen glands?: No Easy bruising?: No  Cardiovascular Leg swelling?: No Chest pain?: No  Respiratory Cough?: No Shortness of breath?: No  Endocrine Excessive thirst?: No  Musculoskeletal Back pain?: No Joint pain?: No  Neurological Headaches?: No Dizziness?: No  Psychologic Depression?: No Anxiety?: No  Physical Exam: BP (!) 144/76   Pulse 94   Ht 5\' 2"  (1.575 m)   Wt 180 lb (81.6 kg)   BMI 32.92 kg/m   Constitutional:  Alert and oriented, No acute distress.  Laboratory Data: Lab Results  Component Value Date   WBC 7.4 07/15/2017   HGB 15.5 07/15/2017   HCT 44.7 07/15/2017   MCV 93.6 07/15/2017   PLT 212 07/15/2017    Lab Results  Component Value Date   CREATININE 0.77 07/15/2017    No results found for: PSA  Lab Results  Component Value Date   TESTOSTERONE 19 06/17/2017    No results found for: HGBA1C  Urinalysis    Component Value Date/Time   COLORURINE YELLOW (A) 10/01/2016 1540   APPEARANCEUR Clear 07/26/2017 1531   LABSPEC 1.008 10/01/2016 1540   LABSPEC 1.018 10/18/2012 1346   PHURINE 5.0 10/01/2016 1540   GLUCOSEU Negative 07/26/2017 1531   GLUCOSEU Negative 10/18/2012 1346   HGBUR NEGATIVE 10/01/2016 1540   BILIRUBINUR Negative 07/26/2017 1531   BILIRUBINUR Negative 10/18/2012 1346   KETONESUR NEGATIVE 10/01/2016 1540   PROTEINUR Negative 07/26/2017 1531   PROTEINUR NEGATIVE 10/01/2016 1540   NITRITE Negative 07/26/2017 1531   NITRITE NEGATIVE 10/01/2016 1540   LEUKOCYTESUR Negative 07/26/2017 1531   LEUKOCYTESUR Negative 10/18/2012 1346    Pertinent Imaging:   Assessment & Plan: Since urgency incontinence little bit worse than baseline.  Samples given of Myrbetriq.   Lisbeth Ply is paid for.  Call if culture positive.  Always consider refractory treatments in the future  There are no diagnoses linked to this encounter.  No follow-ups on file.  Tyshae Stair  Reola Mosher, MD  [facility] [address] [place], [place] [address] [phone]

## 2019-07-04 LAB — URINALYSIS, COMPLETE
Bilirubin, UA: NEGATIVE
Glucose, UA: NEGATIVE
Ketones, UA: NEGATIVE
Leukocytes,UA: NEGATIVE
Nitrite, UA: NEGATIVE
Protein,UA: NEGATIVE
Specific Gravity, UA: 1.015 (ref 1.005–1.030)
Urobilinogen, Ur: 0.2 mg/dL (ref 0.2–1.0)
pH, UA: 5.5 (ref 5.0–7.5)

## 2019-07-04 LAB — MICROSCOPIC EXAMINATION
Bacteria, UA: NONE SEEN
RBC, Urine: NONE SEEN /hpf (ref 0–2)

## 2019-07-05 LAB — CULTURE, URINE COMPREHENSIVE

## 2019-10-02 ENCOUNTER — Ambulatory Visit: Payer: Medicare Other | Admitting: Urology

## 2019-10-09 ENCOUNTER — Ambulatory Visit: Payer: Medicare Other | Admitting: Urology

## 2019-10-30 ENCOUNTER — Ambulatory Visit: Payer: Medicare Other | Admitting: Urology

## 2019-10-30 ENCOUNTER — Encounter: Payer: Self-pay | Admitting: Urology

## 2019-11-28 ENCOUNTER — Ambulatory Visit: Payer: Medicare Other | Attending: Specialist

## 2019-11-28 DIAGNOSIS — G4733 Obstructive sleep apnea (adult) (pediatric): Secondary | ICD-10-CM | POA: Diagnosis not present

## 2019-11-28 DIAGNOSIS — J984 Other disorders of lung: Secondary | ICD-10-CM | POA: Diagnosis not present

## 2019-11-29 ENCOUNTER — Other Ambulatory Visit: Payer: Self-pay

## 2019-12-04 ENCOUNTER — Ambulatory Visit: Payer: Medicare Other | Admitting: Urology

## 2019-12-04 ENCOUNTER — Other Ambulatory Visit: Payer: Self-pay

## 2019-12-04 ENCOUNTER — Telehealth: Payer: Self-pay | Admitting: Urology

## 2019-12-04 NOTE — Telephone Encounter (Signed)
Pt was late for appt due to front desk being crowded in medical mall.  MacDiarmid refused to see pt and asked for her to be rescheduled.  She is out of Toviaz 8mg  and asked for samples to last til next appt on 6/7.  We are out of Toviaz samples and pt asked if we got any in, if we could call her so she could come by and get samples to last until her next appt.

## 2019-12-04 NOTE — Progress Notes (Unsigned)
   12/04/2019 10:13 AM   Bonnie Gomez 02-27-1966 [id number]  Referring provider: Remi Haggard, [place] [place],  [place] [id number]  No chief complaint on file.   HPI:

## 2019-12-25 ENCOUNTER — Ambulatory Visit: Payer: Medicare Other | Admitting: Urology

## 2020-01-01 ENCOUNTER — Ambulatory Visit: Payer: Medicare Other | Admitting: Urology

## 2020-01-08 ENCOUNTER — Ambulatory Visit (INDEPENDENT_AMBULATORY_CARE_PROVIDER_SITE_OTHER): Payer: Medicare Other | Admitting: Urology

## 2020-01-08 ENCOUNTER — Encounter: Payer: Self-pay | Admitting: Urology

## 2020-01-08 ENCOUNTER — Other Ambulatory Visit: Payer: Self-pay

## 2020-01-08 VITALS — BP 160/101 | HR 63 | Ht 63.0 in | Wt 175.0 lb

## 2020-01-08 DIAGNOSIS — N3946 Mixed incontinence: Secondary | ICD-10-CM | POA: Diagnosis not present

## 2020-01-08 MED ORDER — MIRABEGRON ER 50 MG PO TB24: 50.0000 mg | ORAL_TABLET | Freq: Every day | ORAL | 11 refills | Status: DC

## 2020-01-08 NOTE — Progress Notes (Signed)
01/08/2020 3:57 PM   Bonnie Gomez 1965/11/11 [id number]  Referring provider: Remi Haggard, [place],   [address]  No chief complaint on file.   HPI: The patient has mixed incontinence with some improvement on double therapy.Once again the bathroom mapping is what is bothering her the most. She did leak quite a bit when she coughs with smoking but this is not the primary complaint.Patient has not had urodynamics and had a negative cough test after normal cystoscopy  Last time she was doing very well on double therapy Toviaz and Myrbetriq. The Vesicare with Myrbetriq or without did not work. The Lisbeth Ply unfortunately is now $300 and when she goes on and off it she sees a tremendous difference  Oddly Detrol was very expensive. She is on oxybutynin ER 10 mg of the Myrbetriq and is helping some.   Patient is nearly completely dry on Toviaz and Myrbetriq. Once she was dreaming and leaked and once she leak with a fall. She is improved on oxybutynin but near not as well and the Detrol was expensive apparently.  It is a shame to offer her refractory therapy when she is doing so well with these medications. I gave her 3 months of samples and reevaluate her then.   Urge incontinence a bit worse with foot on the floor syndrome first in the morning.  Sometimes she leaks when she is out shopping.  I do not think she has an infection but sent for culture.    Since urgency incontinence little bit worse than baseline.  Samples given of Myrbetriq.  Lisbeth Ply is paid for.  Call if culture positive.  Always consider refractory treatments in the future  Today Incontinence much better.  Still does some bathroom mapping.  Still leaks but much better on the combination.  Insurance would not cover Toviaz.  They will cover Myrbetriq and Myrbetriq prescription renewed.  2 months 8 mg Apples given.  She can come by and get samples when she wants.  See me in a  year.  Clinically not infected today   PMH: Past Medical History:  Diagnosis Date  . Allergy   . Anxiety   . Arthritis   . Asthma 2001  . COPD (chronic obstructive pulmonary disease) ([place]) 2013  . Depression   . Emphysema lung ([place])   . 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 Hiatal hernia   . Hypertension   . Insomnia   . Overactive bladder   . Pneumonia    2013  . Thyroid disease   . Vitamin D deficiency     Surgical History: Past Surgical History:  Procedure Laterality Date  . ANGIOPLASTY  2008, 2012  . CHOLECYSTECTOMY  1985  . COLONOSCOPY  2013  . GYNECOLOGIC CRYOSURGERY  1993  . SKIN BIOPSY  2006  . TUBAL LIGATION  1992  . UPPER GI ENDOSCOPY  2013   [place]    Home Medications:  Allergies as of 01/08/2020      Reactions   Duloxetine Other (See Comments)   Agitation   Varenicline Other (See Comments)   Agitation   Duloxetine Hcl Anxiety   Agitation      Medication List       Accurate as of January 08, 2020  3:57 PM. If you have any questions, ask your nurse or doctor.        albuterol (2.5 MG/3ML) 0.083% nebulizer solution Commonly known as: PROVENTIL INHALE 1 VIAL  USING NEBULIZER EVERY SIX HOURS AS NEEDED   arformoterol 15 MCG/2ML Nebu Commonly known as: BROVANA Inhale into the lungs.   aspirin EC 81 MG tablet Take 81 mg by mouth every morning.   baclofen 10 MG tablet Commonly known as: LIORESAL Take 10 mg by mouth 3 (three) times daily as needed for muscle spasms.   BD Integra Syringe 25G X 1" 3 ML Misc Generic drug: SYRINGE-NEEDLE (DISP) 3 ML USE WITH B12   budesonide 0.5 MG/2ML nebulizer solution Commonly known as: PULMICORT TAKE 2 MLS (0.5 MG TOTAL) BY NEBULIZATION ONCE DAILY.   Chantix Continuing Month Pak 1 MG tablet Generic drug: varenicline   clonazePAM 1 MG tablet Commonly known as: KLONOPIN Take 1 mg by mouth 3 (three) times daily as needed for anxiety.   Daliresp 500 MCG Tabs tablet Generic drug:  roflumilast Take 500 mcg by mouth every evening.   DULoxetine 60 MG capsule Commonly known as: CYMBALTA   estradiol 0.5 MG tablet Commonly known as: ESTRACE Take 1 tablet (0.5 mg total) by mouth every morning. For 21 days. Do not take for 1 week, then start again.   estradiol 0.5 MG tablet Commonly known as: ESTRACE Take 1 tablet (0.5 mg total) by mouth daily. PATIENT WILL NEED AN APPOINTMENT BEFORE ANY FURTHER REFILLS GIVEN   fentaNYL 50 MCG/HR Commonly known as: DURAGESIC APPLY 1 [place]. REMOVE OLD PATCH BEFORE APPLYING A NEW ONE.   fesoterodine 8 MG Tb24 tablet Commonly known as: TOVIAZ Take 1 tablet (8 mg total) by mouth daily.   Fioricet 50-325-40 MG tablet Generic drug: butalbital-acetaminophen-caffeine Take 1 tablet by mouth every 4 (four) hours as needed for headache.   fluconazole 150 MG tablet Commonly known as: DIFLUCAN Take 150 mg by mouth daily.   FLUoxetine 40 MG capsule Commonly known as: PROZAC   furosemide 40 MG tablet Commonly known as: LASIX Take 40 mg by mouth daily as needed for fluid or edema.   gabapentin 300 MG capsule Commonly known as: NEURONTIN Take 300 mg by mouth every 6 (six) hours.   ketoconazole 2 % cream Commonly known as: NIZORAL APPLY A SMALL AMOUNT TOPICALLY TWO TIMES A DAY   medroxyPROGESTERone 2.5 MG tablet Commonly known as: PROVERA Take 1 tablet (2.5 mg total) by mouth daily.   mirabegron ER 50 MG Tb24 tablet Commonly known as: MYRBETRIQ Take 1 tablet (50 mg total) by mouth daily.   mometasone 0.1 % cream Commonly known as: ELOCON Apply 1 application topically 2 (two) times daily.   mometasone 50 MCG/ACT nasal spray Commonly known as: NASONEX Place into the nose.   nystatin powder Commonly known as: MYCOSTATIN/NYSTOP APPLY SMALL AMOUNT TOPICALLY TWO TIMES A DAY AS NEEDED   oxyCODONE-acetaminophen 7.5-325 MG tablet Commonly known as: PERCOCET TAKE 1 TABLET BY MOUTH 4 TIMES A DAY FOR 31 DAYS    pantoprazole 40 MG tablet Commonly known as: PROTONIX Take 40 mg by mouth daily.   terbinafine 250 MG tablet Commonly known as: LAMISIL Take 250 mg by mouth daily.   tiotropium 18 MCG inhalation capsule Commonly known as: SPIRIVA Place 18 mcg into inhaler and inhale daily.   valACYclovir 500 MG tablet Commonly known as: VALTREX Take 500 mg by mouth daily.   Vitamin D3 125 MCG (5000 UT) Caps Take 10,000 Units by mouth every morning.       Allergies:  Allergies  Allergen Reactions  . Duloxetine Other (See Comments)    Agitation  . Varenicline Other (See Comments)  Agitation  . Duloxetine Hcl Anxiety    Agitation    Family History: Family History  Problem Relation Age of Onset  . Cancer Mother        lung  . Cancer Father        lung  . Cancer Maternal Grandmother        breast  . Breast cancer Maternal Grandmother   . Scoliosis Brother   . Arthritis Brother   . Uterine cancer Paternal Grandmother   . Breast cancer Cousin   . Bladder Cancer Neg Hx   . Kidney cancer Neg Hx     Social History:  reports that she has been smoking cigarettes. She has a 35.00 pack-year smoking history. She has never used smokeless tobacco. She reports that she does not drink alcohol and does not use drugs.  ROS:                                        Physical Exam: There were no vitals taken for this visit.   Lab Results  Component Value Date   WBC 7.4 07/15/2017   HGB 15.5 07/15/2017   HCT 44.7 07/15/2017   MCV 93.6 07/15/2017   PLT 212 07/15/2017    Lab Results  Component Value Date   CREATININE 0.77 07/15/2017    No results found for: PSA  Lab Results  Component Value Date   TESTOSTERONE 19 06/17/2017    No results found for: HGBA1C  Urinalysis    Component Value Date/Time   COLORURINE YELLOW (A) 10/01/2016 1540   APPEARANCEUR Hazy (A) 07/03/2019 1528   LABSPEC 1.008 10/01/2016 1540   LABSPEC 1.018 10/18/2012 1346   PHURINE  5.0 10/01/2016 1540   GLUCOSEU Negative 07/03/2019 1528   GLUCOSEU Negative 10/18/2012 1346   HGBUR NEGATIVE 10/01/2016 1540   BILIRUBINUR Negative 07/03/2019 1528   BILIRUBINUR Negative 10/18/2012 1346   KETONESUR NEGATIVE 10/01/2016 1540   PROTEINUR Negative 07/03/2019 1528   PROTEINUR NEGATIVE 10/01/2016 1540   NITRITE Negative 07/03/2019 1528   NITRITE NEGATIVE 10/01/2016 1540   LEUKOCYTESUR Negative 07/03/2019 1528   LEUKOCYTESUR Negative 10/18/2012 1346    Pertinent Imaging:   Assessment & Plan: Reassess 1 year  There are no diagnoses linked to this encounter.  No follow-ups on file.  Reece Packer, MD  [facility] [address] [place], [place] [address] [phone]

## 2020-02-13 ENCOUNTER — Other Ambulatory Visit: Payer: Self-pay | Admitting: Specialist

## 2020-02-13 DIAGNOSIS — J439 Emphysema, unspecified: Secondary | ICD-10-CM

## 2020-02-13 DIAGNOSIS — R634 Abnormal weight loss: Secondary | ICD-10-CM

## 2020-02-13 DIAGNOSIS — R059 Cough, unspecified: Secondary | ICD-10-CM

## 2020-02-13 DIAGNOSIS — R0602 Shortness of breath: Secondary | ICD-10-CM

## 2021-01-13 ENCOUNTER — Emergency Department: Payer: Medicare Other

## 2021-01-13 ENCOUNTER — Encounter: Payer: Self-pay | Admitting: Intensive Care

## 2021-01-13 ENCOUNTER — Ambulatory Visit: Payer: Self-pay | Admitting: Urology

## 2021-01-13 ENCOUNTER — Other Ambulatory Visit: Payer: Self-pay

## 2021-01-13 ENCOUNTER — Emergency Department
Admission: EM | Admit: 2021-01-13 | Discharge: 2021-01-14 | Disposition: A | Discharge status: Home / Self Care | Payer: Medicare Other | Attending: Emergency Medicine | Admitting: Emergency Medicine

## 2021-01-13 DIAGNOSIS — J45909 Unspecified asthma, uncomplicated: Secondary | ICD-10-CM | POA: Insufficient documentation

## 2021-01-13 DIAGNOSIS — J441 Chronic obstructive pulmonary disease with (acute) exacerbation: Secondary | ICD-10-CM | POA: Insufficient documentation

## 2021-01-13 DIAGNOSIS — I10 Essential (primary) hypertension: Secondary | ICD-10-CM | POA: Insufficient documentation

## 2021-01-13 DIAGNOSIS — Z7982 Long term (current) use of aspirin: Secondary | ICD-10-CM | POA: Diagnosis not present

## 2021-01-13 DIAGNOSIS — R609 Edema, unspecified: Secondary | ICD-10-CM

## 2021-01-13 DIAGNOSIS — R079 Chest pain, unspecified: Secondary | ICD-10-CM | POA: Insufficient documentation

## 2021-01-13 DIAGNOSIS — F17210 Nicotine dependence, cigarettes, uncomplicated: Secondary | ICD-10-CM | POA: Insufficient documentation

## 2021-01-13 DIAGNOSIS — R600 Localized edema: Secondary | ICD-10-CM | POA: Diagnosis not present

## 2021-01-13 DIAGNOSIS — M7989 Other specified soft tissue disorders: Secondary | ICD-10-CM | POA: Diagnosis present

## 2021-01-13 LAB — CBC WITH DIFFERENTIAL/PLATELET
Abs Immature Granulocytes: 0.03 10*3/uL (ref 0.00–0.07)
Basophils Absolute: 0 10*3/uL (ref 0.0–0.1)
Basophils Relative: 0 %
Eosinophils Absolute: 0.1 10*3/uL (ref 0.0–0.5)
Eosinophils Relative: 1 %
HCT: 44 % (ref 36.0–46.0)
Hemoglobin: 14.8 g/dL (ref 12.0–15.0)
Immature Granulocytes: 0 %
Lymphocytes Relative: 32 %
Lymphs Abs: 2.6 10*3/uL (ref 0.7–4.0)
MCH: 31.8 pg (ref 26.0–34.0)
MCHC: 33.6 g/dL (ref 30.0–36.0)
MCV: 94.4 fL (ref 80.0–100.0)
Monocytes Absolute: 0.5 10*3/uL (ref 0.1–1.0)
Monocytes Relative: 6 %
Neutro Abs: 4.7 10*3/uL (ref 1.7–7.7)
Neutrophils Relative %: 61 %
Platelets: 221 10*3/uL (ref 150–400)
RBC: 4.66 MIL/uL (ref 3.87–5.11)
RDW: 13.9 % (ref 11.5–15.5)
WBC: 7.9 10*3/uL (ref 4.0–10.5)
nRBC: 0 % (ref 0.0–0.2)

## 2021-01-13 LAB — COMPREHENSIVE METABOLIC PANEL
ALT: 12 U/L (ref 0–44)
AST: 20 U/L (ref 15–41)
Albumin: 3.6 g/dL (ref 3.5–5.0)
Alkaline Phosphatase: 80 U/L (ref 38–126)
Anion gap: 7 (ref 5–15)
BUN: 6 mg/dL (ref 6–20)
CO2: 31 mmol/L (ref 22–32)
Calcium: 8.5 mg/dL — ABNORMAL LOW (ref 8.9–10.3)
Chloride: 97 mmol/L — ABNORMAL LOW (ref 98–111)
Creatinine, Ser: 0.94 mg/dL (ref 0.44–1.00)
GFR, Estimated: >60 mL/min (ref >60)
Glucose, Bld: 145 mg/dL — ABNORMAL HIGH (ref 70–99)
Potassium: 3.8 mmol/L (ref 3.5–5.1)
Sodium: 135 mmol/L (ref 135–145)
Total Bilirubin: 0.6 mg/dL (ref 0.3–1.2)
Total Protein: 6.8 g/dL (ref 6.5–8.1)

## 2021-01-13 NOTE — ED Notes (Signed)
Light green, purple, and blue blood tubes sent to lab

## 2021-01-13 NOTE — ED Triage Notes (Signed)
Patient presents with right leg swelling and  sent to rule out DVT from [facility]. PCP placed patient on lasix 12/26/20.

## 2021-01-14 ENCOUNTER — Emergency Department: Payer: Medicare Other

## 2021-01-14 DIAGNOSIS — R600 Localized edema: Secondary | ICD-10-CM | POA: Diagnosis not present

## 2021-01-14 LAB — D-DIMER, QUANTITATIVE: D-Dimer, Quant: 0.72 ug/mL-FEU — ABNORMAL HIGH (ref 0.00–0.50)

## 2021-01-14 LAB — TROPONIN I (HIGH SENSITIVITY): Troponin I (High Sensitivity): 3 ng/L (ref <18)

## 2021-01-14 LAB — BRAIN NATRIURETIC PEPTIDE: B Natriuretic Peptide: 52.5 pg/mL (ref 0.0–100.0)

## 2021-01-14 MED ORDER — IOHEXOL 350 MG/ML SOLN
75.0000 mL | Freq: Once | INTRAVENOUS | Status: AC | PRN
  Administered 2021-01-14: 75 mL via INTRAVENOUS

## 2021-01-14 NOTE — Discharge Instructions (Addendum)
Continue Lasix and TED hose daily while awake.  Elevate legs whenever possible, including sleep.  Return to the ER for worsening symptoms, persistent vomiting, difficulty breathing or other concerns.

## 2021-01-14 NOTE — ED Provider Notes (Signed)
[facility] Emergency Department Provider Note   ____________________________________________   Event Date/Time   First MD Initiated Contact with Patient 01/14/21 0007     (approximate)  I have reviewed the triage vital signs and the nursing notes.   HISTORY  Chief Complaint Leg Swelling (right)    HPI Bonnie Gomez is a 55 y.o. female sent from urgent care for evaluation of chest pain and right leg swelling.  Patient reports issues with BLE swelling since September 2021.  Has been on and off Lasix since that time.  Recently restarted Lasix per her PCP.  Reports increased swelling in her right leg up to her hip.  Has also been having occasional chest pain over the past several weeks.  Denies associated diaphoresis, shortness of breath, palpitations, nausea/vomiting or dizziness.  Denies recent travel or hormone use.     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 Authorizing Provider  albuterol (PROVENTIL) (2.5 MG/3ML) 0.083% nebulizer  solution INHALE 1 VIAL USING NEBULIZER EVERY SIX HOURS AS NEEDED 04/03/14   [provider]  arformoterol (BROVANA) 15 MCG/2ML NEBU Inhale into the lungs. 02/17/17   [provider]  aspirin EC 81 MG tablet Take 81 mg by mouth every morning.     [provider]  baclofen (LIORESAL) 10 MG tablet Take 10 mg by mouth 3 (three) times daily as needed for muscle spasms.    [provider]  BD INTEGRA SYRINGE 25G X 1" 3 ML MISC USE WITH B12 01/09/15   [provider]  budesonide (PULMICORT) 0.5 MG/2ML nebulizer solution TAKE 2 MLS (0.5 MG TOTAL) BY NEBULIZATION ONCE DAILY. 04/18/17   [provider]  butalbital-acetaminophen-caffeine (FIORICET) 50-325-40 MG tablet Take 1 tablet by mouth every 4 (four) hours as needed for headache.    [provider]  CHANTIX CONTINUING MONTH PAK 1 MG tablet  09/21/16   [provider]  Cholecalciferol (VITAMIN D3) 5000 units CAPS Take 10,000 Units by mouth every morning.    [provider]  clonazePAM (KLONOPIN) 0.5 MG tablet Take 0.5 mg by mouth 2 (two) times daily as needed. 08/02/19   [provider]  clonazePAM (KLONOPIN) 1 MG tablet Take 1 mg by mouth 3 (three) times daily as needed for anxiety.     [provider]  colestipol (COLESTID) 1 g tablet  12/11/19   [provider]  DALIRESP 500 MCG TABS tablet Take 500 mcg by mouth every evening.  01/10/16   [provider]  DULoxetine (CYMBALTA) 60 MG capsule  09/21/16   [provider]  estradiol (ESTRACE) 0.5 MG tablet Take 1 tablet (0.5 mg total) by mouth every morning. For 21 days. Do not take for 1 week, then start again. 11/11/17   Lawhorn, Lara Mulch, CNM  estradiol (ESTRACE) 0.5 MG tablet Take 1 tablet (0.5 mg total) by mouth daily. PATIENT WILL NEED AN APPOINTMENT BEFORE ANY FURTHER REFILLS GIVEN 09/28/18   Lawhorn, Lara Mulch, CNM  fentaNYL (DURAGESIC - DOSED MCG/HR) 50 MCG/HR APPLY 1 PATCH  EVERY 72 HOURS. REMOVE OLD PATCH BEFORE APPLYING A NEW ONE. 09/10/16   [provider]  fesoterodine (TOVIAZ) 8 MG TB24 tablet Take 1 tablet (8 mg total) by mouth daily. 08/01/18   Bjorn Loser, MD  fluconazole (DIFLUCAN) 150 MG tablet Take 150 mg by mouth daily. 06/02/18   [provider]  FLUoxetine (PROZAC) 40 MG capsule  09/25/16   [provider]  furosemide (LASIX) 40 MG tablet Take 40 mg by mouth daily as needed for fluid or edema.  04/07/15   [provider]  gabapentin (NEURONTIN) 300 MG capsule Take 300 mg by mouth every 6 (six) hours.  03/21/15   [provider]  ketoconazole (NIZORAL) 2 % cream APPLY A SMALL AMOUNT TOPICALLY TWO TIMES A DAY 04/08/15   [provider]  medroxyPROGESTERone (PROVERA) 2.5 MG tablet Take 1 tablet (2.5 mg total) by mouth daily. 03/07/18   Lawhorn, Lara Mulch, CNM  mirabegron ER (MYRBETRIQ) 50 MG TB24 tablet Take 1 tablet (50 mg total) by mouth daily. 01/08/20   MacDiarmid, Nicki Reaper, MD  mometasone (ELOCON) 0.1 % cream Apply 1 application topically 2 (two) times daily.    [provider]  mometasone (NASONEX) 50 MCG/ACT nasal spray Place into the nose. 03/16/18   [provider]  nystatin (MYCOSTATIN/NYSTOP) powder APPLY SMALL AMOUNT TOPICALLY TWO TIMES A DAY AS NEEDED 02/23/17   [provider]  oxyCODONE-acetaminophen (PERCOCET) 7.5-325 MG tablet TAKE 1 TABLET BY MOUTH 4 TIMES A DAY FOR 31 DAYS 09/21/16   [provider]  pantoprazole (PROTONIX) 40 MG tablet Take 40 mg by mouth daily. 04/07/17   [provider]  rizatriptan (MAXALT-MLT) 10 MG disintegrating tablet Take by mouth. 01/06/20   [provider]  terbinafine (LAMISIL) 250 MG tablet Take 250 mg by mouth daily.    [provider]  tiotropium (SPIRIVA) 18 MCG inhalation capsule Place 18 mcg into inhaler and inhale daily.     [provider]  valACYclovir (VALTREX) 500 MG tablet Take 500  mg by mouth daily. 01/09/16   [provider]    Allergies Duloxetine, Varenicline, and Duloxetine hcl  Family History  Problem Relation Age of Onset   Cancer Mother        lung   Cancer Father        lung   Cancer Maternal Grandmother        breast   Breast cancer Maternal Grandmother    Scoliosis Brother    Arthritis Brother    Uterine cancer Paternal Grandmother    Breast cancer Cousin    Bladder Cancer Neg Hx    Kidney cancer Neg Hx     Social History Social History   Tobacco Use   Smoking status: Every Day  Packs/day: 1.00    Years: 35.00    Pack years: 35.00    Types: Cigarettes   Smokeless tobacco: Never   Tobacco comments:    "not now "per pt  Vaping Use   Vaping Use: Some days  Substance Use Topics   Alcohol use: No    Alcohol/week: 0.0 standard drinks   Drug use: Yes    Comment: prescribed 50mg  fentanyl patch & 10mg  oxy    Review of Systems  Constitutional: No fever/chills Eyes: No visual changes. ENT: No sore throat. Cardiovascular: Positive for chest pain. Respiratory: Denies shortness of breath. Gastrointestinal: No abdominal pain.  No nausea, no vomiting.  No diarrhea.  No constipation. Genitourinary: Negative for dysuria. Musculoskeletal: Positive for RLE swelling.  Negative for back pain. Skin: Negative for rash. Neurological: Negative for headaches, focal weakness or numbness.   ____________________________________________   PHYSICAL EXAM:  VITAL SIGNS: ED Triage Vitals  Enc Vitals Group     BP 01/13/21 1804 138/84     Pulse Rate 01/13/21 1804 78     Resp 01/13/21 1804 18     Temp 01/13/21 1804 98.4 F (36.9 C)     Temp Source 01/13/21 1804 Oral     SpO2 01/13/21 1804 97 %     Weight 01/13/21 1806 185 lb (83.9 kg)     Height 01/13/21 1806 5\' 3"  (1.6 m)     Head Circumference --      Peak Flow --      Pain Score 01/13/21 1806 6     Pain Loc --      Pain Edu? --      Excl. in [place]? --     Constitutional: Alert  and oriented. Well appearing and in no acute distress. Eyes: Conjunctivae are normal. PERRL. EOMI. Head: Atraumatic. Nose: No congestion/rhinnorhea. Mouth/Throat: Mucous membranes are moist.   Neck: No stridor.   Cardiovascular: Normal rate, regular rhythm. Grossly normal heart sounds.  Good peripheral circulation. Respiratory: Normal respiratory effort.  No retractions. Lungs CTAB. Gastrointestinal: Soft and nontender to light or deep palpation. No distention. No abdominal bruits. No CVA tenderness. Musculoskeletal: No lower extremity tenderness.  BLE 1+ pitting edema, right> left.  2+ distal pulses.  No joint effusions. Neurologic:  Normal speech and language. No gross focal neurologic deficits are appreciated. No gait instability. Skin:  Skin is warm, dry and intact. No rash noted. Psychiatric: Mood and affect are normal. Speech and behavior are normal.  ____________________________________________   LABS (all labs ordered are listed, but only abnormal results are displayed)  Labs Reviewed  COMPREHENSIVE METABOLIC PANEL - Abnormal; Notable for the following components:      Result Value   Chloride 97 (*)    Glucose, Bld 145 (*)    Calcium 8.5 (*)    All other components within normal limits  D-DIMER, QUANTITATIVE - Abnormal; Notable for the following components:   D-Dimer, Quant 0.72 (*)    All other components within normal limits  CBC WITH DIFFERENTIAL/PLATELET  BRAIN NATRIURETIC PEPTIDE  TROPONIN I (HIGH SENSITIVITY)   ____________________________________________  EKG  ED ECG REPORT I, Leverne Amrhein J, the attending physician, personally viewed and interpreted this ECG.   Date: 01/14/2021  EKG Time: 0101  Rate: 57  Rhythm: normal EKG, normal sinus rhythm  Axis: Normal  Intervals:none  ST&T Change: Nonspecific  ____________________________________________  RADIOLOGY I, Eduarda Scrivens J, personally viewed and evaluated these images (plain radiographs) as part of my  medical decision making, as well as reviewing the  written report by the radiologist.  ED MD interpretation: No DVT; no PE  Official radiology report(s): DG Chest 2 View  Result Date: 01/14/2021 CLINICAL DATA:  Right leg swelling EXAM: CHEST - 2 VIEW COMPARISON:  07/15/2017 FINDINGS: Heart is normal size. Lungs clear. No effusions. No acute bony abnormality. Mild peribronchial thickening. IMPRESSION: Mild bronchitic changes. Electronically Signed   By: Rolm Baptise M.D.   On: 01/14/2021 00:57   CT Angio Chest PE W/Cm &/Or Wo Cm  Result Date: 01/14/2021 CLINICAL DATA:  Leg swelling, chest pain, elevated D-dimer EXAM: CT ANGIOGRAPHY CHEST WITH CONTRAST TECHNIQUE: Multidetector CT imaging of the chest was performed using the standard protocol during bolus administration of intravenous contrast. Multiplanar CT image reconstructions and MIPs were obtained to evaluate the vascular anatomy. CONTRAST:  14mL OMNIPAQUE IOHEXOL 350 MG/ML SOLN COMPARISON:  02/13/2014 FINDINGS: Cardiovascular: Satisfactory opacification of the pulmonary arteries to the segmental level. No evidence of pulmonary embolism. Normal heart size. No pericardial effusion. Mild atherosclerotic calcification within the thoracic aorta. No aortic aneurysm. Mediastinum/Nodes: No enlarged mediastinal, hilar, or axillary lymph nodes. Thyroid gland, trachea, and esophagus demonstrate no significant findings. Lungs/Pleura: Lungs are clear. No pleural effusion or pneumothorax. Central airways are widely patent. Upper Abdomen: Status post cholecystectomy.  No acute abnormality. Musculoskeletal: No acute bone abnormality. No lytic or blastic bone lesion are identified. Review of the MIP images confirms the above findings. IMPRESSION: No pulmonary embolism. No acute intrathoracic pathology. No definite radiographic explanation for the patient's reported symptoms. Aortic Atherosclerosis (ICD10-I70.0). Electronically Signed   By: Fidela Salisbury MD   On:  01/14/2021 02:44   US Venous Img Lower Unilateral Right  Result Date: 01/13/2021 CLINICAL DATA:  55 year old female with right lower extremity pain and swelling. EXAM: Right LOWER EXTREMITY VENOUS DOPPLER ULTRASOUND TECHNIQUE: Gray-scale sonography with compression, as well as color and duplex ultrasound, were performed to evaluate the deep venous system(s) from the level of the common femoral vein through the popliteal and proximal calf veins. COMPARISON:  None. FINDINGS: VENOUS Normal compressibility of the common femoral, superficial femoral, and popliteal veins, as well as the visualized calf veins. Visualized portions of profunda femoral vein and great saphenous vein unremarkable. No filling defects to suggest DVT on grayscale or color Doppler imaging. Doppler waveforms show normal direction of venous flow, normal respiratory plasticity and response to augmentation. Limited views of the contralateral common femoral vein are unremarkable. OTHER There is mild subcutaneous edema of the calf. Limitations: none IMPRESSION: Negative. Electronically Signed   By: Anner Crete M.D.   On: 01/13/2021 19:08    ____________________________________________   PROCEDURES  Procedure(s) performed (including Critical Care):  Procedures   ____________________________________________   INITIAL IMPRESSION / ASSESSMENT AND PLAN / ED COURSE  As part of my medical decision making, I reviewed the following data within the [place] notes reviewed and incorporated, Labs reviewed, EKG interpreted, Old chart reviewed, Radiograph reviewed, and Notes from prior ED visits     55 year old female presenting with RLE swelling and chest pain Differential diagnosis includes, but is not limited to, ACS, aortic dissection, pulmonary embolism, cardiac tamponade, pneumothorax, pneumonia, pericarditis, myocarditis, GI-related causes including esophagitis/gastritis, and musculoskeletal chest wall  pain.     No DVT on Doppler ultrasound.  Will check troponin, D-dimer, BNP and EKG.  Will try TED hose.  Will reassess  Clinical Course as of 01/14/21 0651  Tue Jan 14, 2021  0106 D-dimer elevated.  Will obtain CTA chest to evaluate for PE. [  JS]  [phone] Updated patient on negative CTA chest.  Advised her to continue taking Lasix, wear TED hose and follow-up closely with her PCP.  Strict return precautions given.  Patient verbalizes understanding agrees with plan of care. [JS]    Clinical Course User Index [JS] Paulette Blanch, MD     ____________________________________________   FINAL CLINICAL IMPRESSION(S) / ED DIAGNOSES  Final diagnoses:  Peripheral edema     ED Discharge Orders     None        Note:  This document was prepared using Dragon voice recognition software and may include unintentional dictation errors.    Paulette Blanch, MD 01/14/21 [phone]

## 2021-01-14 NOTE — ED Notes (Signed)
Pt given meal tray, ok per md sung

## 2021-02-03 ENCOUNTER — Encounter: Payer: Self-pay | Admitting: Urology

## 2021-02-03 ENCOUNTER — Other Ambulatory Visit: Payer: Self-pay

## 2021-02-03 ENCOUNTER — Ambulatory Visit (INDEPENDENT_AMBULATORY_CARE_PROVIDER_SITE_OTHER): Payer: Medicare Other | Admitting: Urology

## 2021-02-03 VITALS — BP 138/84 | HR 77

## 2021-02-03 DIAGNOSIS — N3946 Mixed incontinence: Secondary | ICD-10-CM

## 2021-02-03 MED ORDER — MIRABEGRON ER 50 MG PO TB24: 50.0000 mg | ORAL_TABLET | Freq: Every day | ORAL | 3 refills | Status: DC

## 2021-02-03 NOTE — Progress Notes (Signed)
02/03/2021 2:28 PM   Bonnie Gomez 10-22-1965 [id number]  Referring provider: Remi Haggard, [address] [place],  [place] [address]  Chief Complaint  Patient presents with   Urinary Incontinence    HPI: The patient has mixed incontinence with some improvement on double therapy.  Once again the bathroom mapping is what is bothering her the most.  She did leak quite a bit when she coughs with smoking but this is not the primary complaint.  Patient has not had urodynamics and had a negative cough test after normal cystoscopy   Last time she was doing very well on double therapy Toviaz and Myrbetriq.  The Vesicare with Myrbetriq or without did not work.  The Lisbeth Ply unfortunately is now $300 and when she goes on and off it she sees a tremendous difference    Oddly Detrol was very expensive.  She is on oxybutynin ER 10 mg of the Myrbetriq and is helping some.     Patient is nearly completely dry on Toviaz and Myrbetriq.  Once she was dreaming and leaked and once she leak with a fall.  She is improved on oxybutynin but near not as well and the Detrol was expensive apparently.    It is a shame to offer her refractory therapy when she is doing so well with these medications.  I gave her 3 months of samples and reevaluate her then.     Urge incontinence a bit worse with foot on the floor syndrome first in the morning.  Sometimes she leaks when she is out shopping.  I do not think she has an infection but sent for culture.     Since urgency incontinence little bit worse than baseline.  Samples given of Myrbetriq.  Lisbeth Ply is paid for.  Call if culture positive.  Always consider refractory treatments in the future   Today Incontinence much better.  Still does some bathroom mapping.  Still leaks but much better on the combination.  Insurance would not cover Toviaz.  They will cover Myrbetriq and Myrbetriq prescription renewed.  2 months 8 mg Apples given.  She can come by and get  samples when she wants.  See me in a year.  Clinically not infected today    TOday Urge incontinence dramatically improved on Toviaz and Myrbetriq.  90x3 Myrbetriq sent.  She comes in and gets Toviaz samples.  Clinically not infected.  Frequency stable.  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   Hiatal hernia    Hypertension    Insomnia    Overactive bladder    Pneumonia    2013   Thyroid disease    Vitamin D deficiency     Surgical History: Past Surgical History:  Procedure Laterality Date   ANGIOPLASTY  2008, 2012   [place]  2013   [place]   SKIN BIOPSY  2006   TUBAL LIGATION  1992   UPPER GI ENDOSCOPY  2013   [place]    Home Medications:  Allergies as of 02/03/2021       Reactions   Duloxetine Other (See Comments)   Agitation   Varenicline Other (See Comments)   Agitation  Duloxetine Hcl Anxiety   Agitation        Medication List        Accurate as of February 03, 2021  2:28 PM. If you have any questions, ask your nurse or doctor.          albuterol (2.5 MG/3ML) 0.083% nebulizer solution Commonly known as: PROVENTIL INHALE 1 VIAL USING NEBULIZER EVERY SIX HOURS AS NEEDED   arformoterol 15 MCG/2ML Nebu Commonly known as: BROVANA Inhale into the lungs.   aspirin EC 81 MG tablet Take 81 mg by mouth every morning.   baclofen 10 MG tablet Commonly known as: LIORESAL Take 10 mg by mouth 3 (three) times daily as needed for muscle spasms.   BD Integra Syringe 25G X 1" 3 ML Misc Generic drug: SYRINGE-NEEDLE (DISP) 3 ML USE WITH B12   budesonide 0.5 MG/2ML nebulizer solution Commonly known as: PULMICORT TAKE 2 MLS (0.5 MG TOTAL) BY NEBULIZATION ONCE DAILY.   Chantix Continuing Month Pak 1 MG  tablet Generic drug: varenicline   clonazePAM 1 MG tablet Commonly known as: KLONOPIN Take 1 mg by mouth 3 (three) times daily as needed for anxiety.   clonazePAM 0.5 MG tablet Commonly known as: KLONOPIN Take 0.5 mg by mouth 2 (two) times daily as needed.   colestipol 1 g tablet Commonly known as: COLESTID   Daliresp 500 MCG Tabs tablet Generic drug: roflumilast Take 500 mcg by mouth every evening.   DULoxetine 60 MG capsule Commonly known as: CYMBALTA   estradiol 0.5 MG tablet Commonly known as: ESTRACE Take 1 tablet (0.5 mg total) by mouth every morning. For 21 days. Do not take for 1 week, then start again.   estradiol 0.5 MG tablet Commonly known as: ESTRACE Take 1 tablet (0.5 mg total) by mouth daily. PATIENT WILL NEED AN APPOINTMENT BEFORE ANY FURTHER REFILLS GIVEN   fentaNYL 50 MCG/HR Commonly known as: DURAGESIC APPLY 1 [place]. REMOVE OLD PATCH BEFORE APPLYING A NEW ONE.   fesoterodine 8 MG Tb24 tablet Commonly known as: TOVIAZ Take 1 tablet (8 mg total) by mouth daily.   Fioricet 50-325-40 MG tablet Generic drug: butalbital-acetaminophen-caffeine Take 1 tablet by mouth every 4 (four) hours as needed for headache.   fluconazole 150 MG tablet Commonly known as: DIFLUCAN Take 150 mg by mouth daily.   FLUoxetine 40 MG capsule Commonly known as: PROZAC   furosemide 40 MG tablet Commonly known as: LASIX Take 40 mg by mouth daily as needed for fluid or edema.   gabapentin 300 MG capsule Commonly known as: NEURONTIN Take 300 mg by mouth every 6 (six) hours.   ketoconazole 2 % cream Commonly known as: NIZORAL APPLY A SMALL AMOUNT TOPICALLY TWO TIMES A DAY   medroxyPROGESTERone 2.5 MG tablet Commonly known as: PROVERA Take 1 tablet (2.5 mg total) by mouth daily.   mirabegron ER 50 MG Tb24 tablet Commonly known as: MYRBETRIQ Take 1 tablet (50 mg total) by mouth daily.   mometasone 0.1 % cream Commonly known as: ELOCON Apply 1  application topically 2 (two) times daily.   mometasone 50 MCG/ACT nasal spray Commonly known as: NASONEX Place into the nose.   nystatin powder Commonly known as: MYCOSTATIN/NYSTOP APPLY SMALL AMOUNT TOPICALLY TWO TIMES A DAY AS NEEDED   oxyCODONE-acetaminophen 7.5-325 MG tablet Commonly known as: PERCOCET TAKE 1 TABLET BY MOUTH 4 TIMES A DAY FOR 31 DAYS   pantoprazole 40 MG tablet Commonly known as: PROTONIX Take 40 mg by mouth daily.  rizatriptan 10 MG disintegrating tablet Commonly known as: MAXALT-MLT Take by mouth.   terbinafine 250 MG tablet Commonly known as: LAMISIL Take 250 mg by mouth daily.   tiotropium 18 MCG inhalation capsule Commonly known as: SPIRIVA Place 18 mcg into inhaler and inhale daily.   valACYclovir 500 MG tablet Commonly known as: VALTREX Take 500 mg by mouth daily.   Vitamin D3 125 MCG (5000 UT) Caps Take 10,000 Units by mouth every morning.        Allergies:  Allergies  Allergen Reactions   Duloxetine Other (See Comments)    Agitation   Varenicline Other (See Comments)    Agitation   Duloxetine Hcl Anxiety    Agitation    Family History: Family History  Problem Relation Age of Onset   Cancer Mother        lung   Cancer Father        lung   Cancer Maternal Grandmother        breast   Breast cancer Maternal Grandmother    Scoliosis Brother    Arthritis Brother    Uterine cancer Paternal Grandmother    Breast cancer Cousin    Bladder Cancer Neg Hx    Kidney cancer Neg Hx     Social History:  reports that she has been smoking cigarettes. She has a 35.00 pack-year smoking history. She has never used smokeless tobacco. She reports current drug use. She reports that she does not drink alcohol.  ROS:                                        Physical Exam: There were no vitals taken for this visit.  Constitutional:  Alert and oriented, No acute distress. HEENT: [place] AT, moist mucus membranes.   Trachea midline, no masses. Cardiovascular: No clubbing, cyanosis, or edema.  Laboratory Data: Lab Results  Component Value Date   WBC 7.9 01/13/2021   HGB 14.8 01/13/2021   HCT 44.0 01/13/2021   MCV 94.4 01/13/2021   PLT 221 01/13/2021    Lab Results  Component Value Date   CREATININE 0.94 01/13/2021    No results found for: PSA  Lab Results  Component Value Date   TESTOSTERONE 19 06/17/2017    No results found for: HGBA1C  Urinalysis    Component Value Date/Time   COLORURINE YELLOW (A) 10/01/2016 1540   APPEARANCEUR Hazy (A) 07/03/2019 1528   LABSPEC 1.008 10/01/2016 1540   LABSPEC 1.018 10/18/2012 1346   PHURINE 5.0 10/01/2016 1540   GLUCOSEU Negative 07/03/2019 1528   GLUCOSEU Negative 10/18/2012 1346   HGBUR NEGATIVE 10/01/2016 1540   BILIRUBINUR Negative 07/03/2019 1528   BILIRUBINUR Negative 10/18/2012 1346   KETONESUR NEGATIVE 10/01/2016 1540   PROTEINUR Negative 07/03/2019 1528   PROTEINUR NEGATIVE 10/01/2016 1540   NITRITE Negative 07/03/2019 1528   NITRITE NEGATIVE 10/01/2016 1540   LEUKOCYTESUR Negative 07/03/2019 1528   LEUKOCYTESUR Negative 10/18/2012 1346    Pertinent Imaging:   Assessment & Plan: 90x3 Myrbetriq sent.  Toviaz samples given.  I will see in 1 year and treat infections as needed  There are no diagnoses linked to this encounter.  No follow-ups on file.  Reece Packer, MD  [facility] [address] [place], [place] [address] [phone]

## 2021-03-04 ENCOUNTER — Ambulatory Visit (INDEPENDENT_AMBULATORY_CARE_PROVIDER_SITE_OTHER): Payer: Medicare Other | Admitting: Vascular Surgery

## 2021-03-04 ENCOUNTER — Other Ambulatory Visit: Payer: Self-pay

## 2021-03-04 ENCOUNTER — Encounter (INDEPENDENT_AMBULATORY_CARE_PROVIDER_SITE_OTHER): Payer: Self-pay | Admitting: Vascular Surgery

## 2021-03-04 DIAGNOSIS — M7989 Other specified soft tissue disorders: Secondary | ICD-10-CM

## 2021-03-04 DIAGNOSIS — I10 Essential (primary) hypertension: Secondary | ICD-10-CM | POA: Diagnosis not present

## 2021-03-04 DIAGNOSIS — M79609 Pain in unspecified limb: Secondary | ICD-10-CM | POA: Insufficient documentation

## 2021-03-04 DIAGNOSIS — M79604 Pain in right leg: Secondary | ICD-10-CM

## 2021-03-04 NOTE — Progress Notes (Signed)
Patient ID: Bonnie Gomez, female   DOB: [birth year], 55 y.o.   MRN: [id number]  Chief Complaint  Patient presents with   New Patient (Initial Visit)    RLE swelling    HPI Bonnie Gomez is a 55 y.o. female.  I am asked to see the patient by C. Lavena Bullion for evaluation of right foot and leg swelling.  This has been an on and off thing for some time, but a couple of months ago it became severe and prompted an emergency room visit.  There was concern for a DVT at that time although the only ultrasound that I saw showed no evidence of DVT.  She also had an arterial study which showed no evidence significant right lower extremity arterial disease.  The swelling has gotten better but not completely resolved.  The patient really notices some discomfort in her left leg but this is not nearly as bad or consistent as the right leg.  She does report having had back troubles in the past.  This has resulted in numbness in her legs but this seems like a different sensation to her.  No open wounds or infection.  No fever or chills.  There is no clear cause or inciting event that started the symptoms.  Diuretic therapy and elevation do seem to have helped the symptoms somewhat.     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   Hiatal hernia    Hypertension    Insomnia    Overactive bladder    Pneumonia    2013   Thyroid disease    Vitamin D deficiency     Past Surgical History:  Procedure Laterality Date   ANGIOPLASTY  2008, 2012   [place]  2013   [place]   SKIN BIOPSY  2006   TUBAL LIGATION  1992   UPPER GI ENDOSCOPY  2013   [place]     Family History  Problem Relation Age of Onset   Cancer Mother        lung   Cancer Father        lung    Cancer Maternal Grandmother        breast   Breast cancer Maternal Grandmother    Scoliosis Brother    Arthritis Brother    Uterine cancer Paternal Grandmother    Breast cancer Cousin    Bladder Cancer Neg Hx    Kidney cancer Neg Hx       Social History   Tobacco Use   Smoking status: Every Day    Packs/day: 1.00    Years: 35.00    Pack years: 35.00    Types: Cigarettes   Smokeless tobacco: Never   Tobacco comments:    "not now "per pt  Vaping Use   Vaping Use: Some days  Substance Use Topics   Alcohol use: No    Alcohol/week: 0.0 standard drinks   Drug use: Yes    Comment: prescribed '50mg'$  fentanyl patch & '10mg'$  oxy     Allergies  Allergen Reactions   Duloxetine Other (See Comments)    Agitation   Varenicline Other (See Comments)    Agitation  Duloxetine Hcl Anxiety    Agitation    Current Outpatient Medications  Medication Sig Dispense Refill   aspirin EC 81 MG tablet Take 81 mg by mouth every morning.      BD INTEGRA SYRINGE 25G X 1" 3 ML MISC USE WITH B12  5   budesonide (PULMICORT) 0.5 MG/2ML nebulizer solution TAKE 2 MLS (0.5 MG TOTAL) BY NEBULIZATION ONCE DAILY.  11   butalbital-acetaminophen-caffeine (FIORICET) 50-325-40 MG tablet Take 1 tablet by mouth every 4 (four) hours as needed for headache.     Cholecalciferol (VITAMIN D3) 5000 units CAPS Take 10,000 Units by mouth every morning.     clonazePAM (KLONOPIN) 0.5 MG tablet Take 0.5 mg by mouth 2 (two) times daily as needed.     colestipol (COLESTID) 1 g tablet      DALIRESP 500 MCG TABS tablet Take 500 mcg by mouth every evening.   2   dexlansoprazole (DEXILANT) 60 MG capsule Take 1 capsule by mouth daily as needed.     DULoxetine (CYMBALTA) 60 MG capsule      estradiol (ESTRACE) 0.5 MG tablet Take 1 tablet (0.5 mg total) by mouth daily. PATIENT WILL NEED AN APPOINTMENT BEFORE ANY FURTHER REFILLS GIVEN 30 tablet 0   fentaNYL (DURAGESIC - DOSED MCG/HR) 50 MCG/HR APPLY 1 PATCH EVERY 72 HOURS. REMOVE  OLD PATCH BEFORE APPLYING A NEW ONE.  0   fesoterodine (TOVIAZ) 8 MG TB24 tablet Take 1 tablet (8 mg total) by mouth daily. 30 tablet 2   fluconazole (DIFLUCAN) 150 MG tablet Take 150 mg by mouth daily.  0   FLUoxetine (PROZAC) 40 MG capsule      gabapentin (NEURONTIN) 100 MG capsule Take by mouth.     ketoconazole (NIZORAL) 2 % cream APPLY A SMALL AMOUNT TOPICALLY TWO TIMES A DAY  2   medroxyPROGESTERone (PROVERA) 2.5 MG tablet Take 1 tablet (2.5 mg total) by mouth daily. 30 tablet 2   mirabegron ER (MYRBETRIQ) 50 MG TB24 tablet Take 1 tablet (50 mg total) by mouth daily. 90 tablet 3   mometasone (ELOCON) 0.1 % cream Apply 1 application topically 2 (two) times daily.     mometasone (NASONEX) 50 MCG/ACT nasal spray Place into the nose.     nystatin (MYCOSTATIN/NYSTOP) powder APPLY SMALL AMOUNT TOPICALLY TWO TIMES A DAY AS NEEDED  2   Oxycodone HCl 10 MG TABS Take 10 mg by mouth 4 (four) times daily as needed.     rizatriptan (MAXALT-MLT) 10 MG disintegrating tablet Take by mouth.     rosuvastatin (CRESTOR) 20 MG tablet SMARTSIG:1 Tablet(s) By Mouth Every Evening     STIOLTO RESPIMAT 2.5-2.5 MCG/ACT AERS SMARTSIG:2 Puff(s) Via Inhaler Daily     terbinafine (LAMISIL) 250 MG tablet Take 250 mg by mouth daily.     tiZANidine (ZANAFLEX) 4 MG tablet Take 4 mg by mouth 3 (three) times daily as needed.     valACYclovir (VALTREX) 500 MG tablet Take 500 mg by mouth daily.  4   albuterol (PROVENTIL) (2.5 MG/3ML) 0.083% nebulizer solution INHALE 1 VIAL USING NEBULIZER EVERY SIX HOURS AS NEEDED (Patient not taking: Reported on 03/04/2021)     CHANTIX CONTINUING MONTH PAK 1 MG tablet  (Patient not taking: Reported on 03/04/2021)     clonazePAM (KLONOPIN) 1 MG tablet Take 1 mg by mouth 3 (three) times daily as needed for anxiety.  (Patient not taking: Reported on 03/04/2021)     furosemide (LASIX) 40 MG tablet Take 40 mg by mouth  daily as needed for fluid or edema.  (Patient not taking: Reported on 03/04/2021)  5    pantoprazole (PROTONIX) 40 MG tablet Take 40 mg by mouth daily. (Patient not taking: Reported on 03/04/2021)  2   No current facility-administered medications for this visit.      REVIEW OF SYSTEMS (Negative unless checked)  Constitutional: '[]'$ Weight loss  '[]'$ Fever  '[]'$ Chills Cardiac: '[]'$ Chest pain   '[]'$ Chest pressure   '[]'$ Palpitations   '[]'$ Shortness of breath when laying flat   '[]'$ Shortness of breath at rest   '[]'$ Shortness of breath with exertion. Vascular:  '[]'$ Pain in legs with walking   '[]'$ Pain in legs at rest   '[]'$ Pain in legs when laying flat   '[]'$ Claudication   '[]'$ Pain in feet when walking  '[]'$ Pain in feet at rest  '[]'$ Pain in feet when laying flat   '[]'$ History of DVT   '[]'$ Phlebitis   '[]'$ Swelling in legs   '[]'$ Varicose veins   '[]'$ Non-healing ulcers Pulmonary:   '[]'$ Uses home oxygen   '[]'$ Productive cough   '[]'$ Hemoptysis   '[]'$ Wheeze  '[x]'$ COPD   '[x]'$ Asthma Neurologic:  '[]'$ Dizziness  '[]'$ Blackouts   '[]'$ Seizures   '[]'$ History of stroke   '[]'$ History of TIA  '[]'$ Aphasia   '[]'$ Temporary blindness   '[]'$ Dysphagia   '[]'$ Weakness or numbness in arms   '[]'$ Weakness or numbness in legs Musculoskeletal:  '[x]'$ Arthritis   '[]'$ Joint swelling   '[]'$ Joint pain   '[x]'$ Low back pain Hematologic:  '[]'$ Easy bruising  '[]'$ Easy bleeding   '[]'$ Hypercoagulable state   '[]'$ Anemic  '[]'$ Hepatitis Gastrointestinal:  '[]'$ Blood in stool   '[]'$ Vomiting blood  '[x]'$ Gastroesophageal reflux/heartburn   '[]'$ Abdominal pain Genitourinary:  '[]'$ Chronic kidney disease   '[]'$ Difficult urination  '[]'$ Frequent urination  '[]'$ Burning with urination   '[]'$ Hematuria Skin:  '[]'$ Rashes   '[]'$ Ulcers   '[]'$ Wounds Psychological:  '[x]'$ History of anxiety   '[]'$  History of major depression.    Physical Exam BP (!) 148/87 (BP Location: Right Arm)   Pulse 87   Resp 19   Ht '5\' 3"'$  (1.6 m)   Wt 190 lb (86.2 kg)   BMI 33.66 kg/m  Gen:  WD/WN, NAD Head: [place]/AT, No temporalis wasting.  Ear/Nose/Throat: Hearing grossly intact, nares w/o erythema or drainage, oropharynx w/o Erythema/Exudate Eyes: Conjunctiva clear, sclera  non-icteric  Neck: trachea midline.  No JVD.  Pulmonary:  Good air movement, respirations not labored, no use of accessory muscles  Cardiac: RRR, no JVD Vascular:  Vessel Right Left  Radial Palpable Palpable                          DP 2+ 2+  PT 1+ 2+   Gastrointestinal:. No masses, surgical incisions, or scars. Musculoskeletal: M/S 5/5 throughout.  Extremities without ischemic changes.  No deformity or atrophy.  1+ right lower extremity edema. Neurologic: Sensation grossly intact in extremities.  Symmetrical.  Speech is fluent. Motor exam as listed above. Psychiatric: Judgment intact, Mood & affect appropriate for pt's clinical situation. Dermatologic: No rashes or ulcers noted.  No cellulitis or open wounds.    Radiology No results found.  Labs Recent Results (from the past 2160 hour(s))  CBC with Differential     Status: None   Collection Time: 01/13/21  6:13 PM  Result Value Ref Range   WBC 7.9 4.0 - 10.5 K/uL   RBC 4.66 3.87 - 5.11 MIL/uL   Hemoglobin 14.8 12.0 - 15.0 g/dL   HCT 44.0 36.0 - 46.0 %   MCV 94.4 80.0 - 100.0 fL   MCH  31.8 26.0 - 34.0 pg   MCHC 33.6 30.0 - 36.0 g/dL   RDW 13.9 11.5 - 15.5 %   Platelets 221 150 - 400 K/uL   nRBC 0.0 0.0 - 0.2 %   Neutrophils Relative % 61 %   Neutro Abs 4.7 1.7 - 7.7 K/uL   Lymphocytes Relative 32 %   Lymphs Abs 2.6 0.7 - 4.0 K/uL   Monocytes Relative 6 %   Monocytes Absolute 0.5 0.1 - 1.0 K/uL   Eosinophils Relative 1 %   Eosinophils Absolute 0.1 0.0 - 0.5 K/uL   Basophils Relative 0 %   Basophils Absolute 0.0 0.0 - 0.1 K/uL   Immature Granulocytes 0 %   Abs Immature Granulocytes 0.03 0.00 - 0.07 K/uL    Comment: Performed at [facility], [address] [place], [place] [address]  Comprehensive metabolic panel     Status: Abnormal   Collection Time: 01/13/21  6:13 PM  Result Value Ref Range   Sodium 135 135 - 145 mmol/L   Potassium 3.8 3.5 - 5.1 mmol/L   Chloride 97 (L) 98 - 111 mmol/L   CO2 31  22 - 32 mmol/L   Glucose, Bld 145 (H) 70 - 99 mg/dL    Comment: Glucose reference range applies only to samples taken after fasting for at least 8 hours.   BUN 6 6 - 20 mg/dL   Creatinine, Ser 0.94 0.44 - 1.00 mg/dL   Calcium 8.5 (L) 8.9 - 10.3 mg/dL   Total Protein 6.8 6.5 - 8.1 g/dL   Albumin 3.6 3.5 - 5.0 g/dL   AST 20 15 - 41 U/L   ALT 12 0 - 44 U/L   Alkaline Phosphatase 80 38 - 126 U/L   Total Bilirubin 0.6 0.3 - 1.2 mg/dL   GFR, Estimated >60 >60 mL/min    Comment: (NOTE) Calculated using the CKD-EPI Creatinine Equation (2021)    Anion gap 7 5 - 15    Comment: Performed at [facility], [place], [place] [address]  Troponin I (High Sensitivity)     Status: None   Collection Time: 01/14/21 12:30 AM  Result Value Ref Range   Troponin I (High Sensitivity) 3 <18 ng/L    Comment: (NOTE) Elevated high sensitivity troponin I (hsTnI) values and significant  changes across serial measurements may suggest ACS but many other  chronic and acute conditions are known to elevate hsTnI results.  Refer to the "Links" section for chest pain algorithms and additional  guidance. Performed at [facility], [address] [place], [place] [address]   D-dimer, quantitative     Status: Abnormal   Collection Time: 01/14/21 12:30 AM  Result Value Ref Range   D-Dimer, Quant 0.72 (H) 0.00 - 0.50 ug/mL-FEU    Comment: (NOTE) At the manufacturer cut-off value of 0.5 g/mL FEU, this assay has a negative predictive value of 95-100%.This assay is intended for use in conjunction with a clinical pretest probability (PTP) assessment model to exclude pulmonary embolism (PE) and deep venous thrombosis (DVT) in outpatients suspected of PE or DVT. Results should be correlated with clinical presentation. Performed at [facility], [address] [place], [place] [address]   Brain natriuretic peptide     Status: None   Collection Time: 01/14/21 12:30 AM   Result Value Ref Range   B Natriuretic Peptide 52.5 0.0 - 100.0 pg/mL    Comment: Performed at [facility], [address] [place],  [place] [address]    Assessment/Plan:  Swelling of limb I have had a long discussion with the patient regarding swelling and why it  causes symptoms.  Patient will begin wearing graduated compression stockings class 1 (20-30 mmHg) on a daily basis a prescription was given. The patient will  beginning wearing the stockings first thing in the morning and removing them in the evening. The patient is instructed specifically not to sleep in the stockings.   In addition, behavioral modification will be initiated.  This will include frequent elevation, use of over the counter pain medications and exercise such as walking.  I have reviewed systemic causes for chronic edema such as liver, kidney and cardiac etiologies.  The patient denies problems with these organ systems.    Consideration for a lymph pump will also be made based upon the effectiveness of conservative therapy.  This would help to improve the edema control and prevent sequela such as ulcers and infections   Patient should undergo duplex ultrasound of the venous system to ensure that DVT or reflux is not present.  The patient will follow-up with me after the ultrasound.    Essential hypertension blood pressure control important in reducing the progression of atherosclerotic disease. On appropriate oral medications.   Pain in limb Patient has significant right lower extremity pain.  This is likely multifactorial but I suspect there is a significant neuropathic component from her back.  There may be a musculoskeletal component.  She has had a negative arterial study already.  We are going to perform a venous work-up in the near future.      Leotis Pain 03/04/2021, 2:32 PM   This note was created with Dragon medical transcription system.  Any errors from dictation are unintentional.

## 2021-03-04 NOTE — Assessment & Plan Note (Signed)
Patient has significant right lower extremity pain.  This is likely multifactorial but I suspect there is a significant neuropathic component from her back.  There may be a musculoskeletal component.  She has had a negative arterial study already.  We are going to perform a venous work-up in the near future.

## 2021-03-04 NOTE — Assessment & Plan Note (Signed)

## 2021-03-04 NOTE — Assessment & Plan Note (Signed)
blood pressure control important in reducing the progression of atherosclerotic disease. On appropriate oral medications.  

## 2021-03-19 ENCOUNTER — Ambulatory Visit (INDEPENDENT_AMBULATORY_CARE_PROVIDER_SITE_OTHER): Payer: Medicare Other

## 2021-03-19 ENCOUNTER — Encounter (INDEPENDENT_AMBULATORY_CARE_PROVIDER_SITE_OTHER): Payer: Self-pay | Admitting: Nurse Practitioner

## 2021-03-19 ENCOUNTER — Ambulatory Visit (INDEPENDENT_AMBULATORY_CARE_PROVIDER_SITE_OTHER): Payer: Medicare Other | Admitting: Nurse Practitioner

## 2021-03-19 ENCOUNTER — Other Ambulatory Visit: Payer: Self-pay

## 2021-03-19 VITALS — BP 139/85 | HR 85 | Resp 16 | Wt 196.6 lb

## 2021-03-19 DIAGNOSIS — M7989 Other specified soft tissue disorders: Secondary | ICD-10-CM

## 2021-03-19 DIAGNOSIS — I10 Essential (primary) hypertension: Secondary | ICD-10-CM | POA: Diagnosis not present

## 2021-03-19 DIAGNOSIS — R600 Localized edema: Secondary | ICD-10-CM | POA: Diagnosis not present

## 2021-03-19 DIAGNOSIS — M79604 Pain in right leg: Secondary | ICD-10-CM

## 2021-03-30 ENCOUNTER — Encounter (INDEPENDENT_AMBULATORY_CARE_PROVIDER_SITE_OTHER): Payer: Self-pay | Admitting: Nurse Practitioner

## 2021-03-30 NOTE — Progress Notes (Signed)
Subjective:    Patient ID: Bonnie Gomez, female    DOB: Nov 16, 1965, 55 y.o.   MRN: [id number] Chief Complaint  Patient presents with   Follow-up    Ultrasound follow up    Bonnie Gomez is a 55 y.o. female.  The patient returns today for evaluation of right lower extremity leg swelling.  She has had leg swelling ongoing for some time but about 2 and half months ago suddenly became severe and painful.  She went to the emergency room and it was noted that there was no DVT.  However there was subcutaneous edema in the calf area.  She has taken Lasix and this was helpful for resolving swelling.  She notes that there is some numbness associated is well.  The patient had arterial studies that indicated no evidence of significant arterial disease.  She also utilize elevation at this also was helpful.  She did have no wounds or ulcerations.  Since that time the swelling has decreased since her initial office visit.   Today noninvasive studies show evidence of reflux in the right great saphenous vein at the saphenofemoral junction.  There is no evidence of DVT or superficial thrombophlebitis seen bilaterally.   Review of Systems  Cardiovascular:  Positive for leg swelling.  All other systems reviewed and are negative.     Objective:   Physical Exam Vitals reviewed.  HENT:     Head: Normocephalic.  Cardiovascular:     Rate and Rhythm: Normal rate.     Pulses: Normal pulses.  Pulmonary:     Effort: Pulmonary effort is normal.  Skin:    General: Skin is warm and dry.  Neurological:     Mental Status: She is alert and oriented to person, place, and time.  Psychiatric:        Mood and Affect: Mood normal.        Behavior: Behavior normal.        Thought Content: Thought content normal.        Judgment: Judgment normal.    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   Hiatal hernia    Hypertension    Insomnia    Overactive bladder    Pneumonia    2013   Thyroid disease    Vitamin D deficiency     Social History   Socioeconomic History   Marital status: Single    Spouse name: Not on file   Number of children: Not on file   Years of education: Not on file   Highest education level: Not on file  Occupational History   Not on file  Tobacco Use   Smoking status: Every Day    Packs/day: 1.00    Years: 35.00    Pack years: 35.00    Types: Cigarettes   Smokeless tobacco: Never   Tobacco comments:    "not now "per pt  Vaping Use   Vaping Use: Some days  Substance and Sexual Activity   Alcohol use: No    Alcohol/week: 0.0 standard  drinks   Drug use: Yes    Comment: prescribed '50mg'$  fentanyl patch & '10mg'$  oxy   Sexual activity: Never  Other Topics Concern   Not on file  Social History Narrative   Not on file   Social Determinants of Health   Financial Resource Strain: Not on file  Food Insecurity: Not on file  Transportation Needs: Not on file  Physical Activity: Not on file  Stress: Not on file  Social Connections: Not on file  Intimate Partner Violence: Not on file    Past Surgical History:  Procedure Laterality Date   ANGIOPLASTY  2008, 2012   [place]  2006   [facility] ENDOSCOPY  2013   [place]    Family History  Problem Relation Age of Onset   Cancer Mother        lung   Cancer Father        lung   Cancer Maternal Grandmother        breast   Breast cancer Maternal Grandmother    Scoliosis Brother    Arthritis Brother    Uterine cancer Paternal Grandmother    Breast cancer Cousin    Bladder Cancer Neg Hx    Kidney cancer  Neg Hx     Allergies  Allergen Reactions   Duloxetine Other (See Comments)    Agitation   Varenicline Other (See Comments)    Agitation   Duloxetine Hcl Anxiety    Agitation    CBC Latest Ref Rng & Units 01/13/2021 07/15/2017 10/01/2016  WBC 4.0 - 10.5 K/uL 7.9 7.4 8.7  Hemoglobin 12.0 - 15.0 g/dL 14.8 15.5 14.9  Hematocrit 36.0 - 46.0 % 44.0 44.7 42.3  Platelets 150 - 400 K/uL 221 212 256      CMP     Component Value Date/Time   NA 135 01/13/2021 1813   NA 142 06/17/2017 1513   NA 138 10/18/2012 1345   K 3.8 01/13/2021 1813   K 3.7 10/18/2012 1345   CL 97 (L) 01/13/2021 1813   CL 104 10/18/2012 1345   CO2 31 01/13/2021 1813   CO2 28 10/18/2012 1345   GLUCOSE 145 (H) 01/13/2021 1813   GLUCOSE 179 (H) 10/18/2012 1345   BUN 6 01/13/2021 1813   BUN 5 (L) 06/17/2017 1513   BUN 10 10/18/2012 1345   CREATININE 0.94 01/13/2021 1813   CREATININE 0.91 10/18/2012 1345   CALCIUM 8.5 (L) 01/13/2021 1813   CALCIUM 8.9 10/18/2012 1345   PROT 6.8 01/13/2021 1813   PROT 6.1 06/17/2017 1513   PROT 7.2 12/11/2011 1352   ALBUMIN 3.6 01/13/2021 1813   ALBUMIN 3.9 06/17/2017 1513   ALBUMIN 3.1 (L) 12/11/2011 1352   AST 20 01/13/2021 1813   AST 20 12/11/2011 1352   ALT 12 01/13/2021 1813   ALT 14 12/11/2011 1352   ALKPHOS 80 01/13/2021 1813   ALKPHOS 75 12/11/2011 1352   BILITOT 0.6 01/13/2021 1813   BILITOT 0.3 06/17/2017 1513   BILITOT 0.6 12/11/2011 1352   GFRNONAA >60 01/13/2021 1813   GFRNONAA >60 10/18/2012 1345   GFRAA >60 07/15/2017 1509   GFRAA >60 10/18/2012 1345     No results found.     Assessment & Plan:   1. Swelling of limb  Recommend:   I have had a long discussion with the patient regarding  varicose veins and  why they cause symptoms.  Patient will begin wearing graduated compression stockings class 1 on a daily basis, beginning first thing in the morning and removing them in the evening. The patient is instructed specifically not to sleep in the  stockings.    The patient  will also begin using over-the-counter analgesics such as Motrin 600 mg po TID to help control the symptoms.    In addition, behavioral modification including elevation during the day will be initiated.    Pending the results of these changes the  patient will be reevaluated in three months.    Further plans will be based on the ultrasound results and whether conservative therapies are successful at eliminating the pain and swelling.    2. Essential hypertension Continue antihypertensive medications as already ordered, these medications have been reviewed and there are no changes at this time.   3. Pain of right lower extremity I suspect that the pain is multifactorial.  Certainly may be neuropathic.  It has decreased since it originally started.  Subcutaneous edema located on previous ultrasounds may be indicative of a possible ruptured Baker's cyst.   Current Outpatient Medications on File Prior to Visit  Medication Sig Dispense Refill   aspirin EC 81 MG tablet Take 81 mg by mouth every morning.      BD INTEGRA SYRINGE 25G X 1" 3 ML MISC USE WITH B12  5   budesonide (PULMICORT) 0.5 MG/2ML nebulizer solution TAKE 2 MLS (0.5 MG TOTAL) BY NEBULIZATION ONCE DAILY.  11   butalbital-acetaminophen-caffeine (FIORICET) 50-325-40 MG tablet Take 1 tablet by mouth every 4 (four) hours as needed for headache.     Cholecalciferol (VITAMIN D3) 5000 units CAPS Take 10,000 Units by mouth every morning.     clonazePAM (KLONOPIN) 0.5 MG tablet Take 0.5 mg by mouth 2 (two) times daily as needed.     colestipol (COLESTID) 1 g tablet      DALIRESP 500 MCG TABS tablet Take 500 mcg by mouth every evening.   2   dexlansoprazole (DEXILANT) 60 MG capsule Take 1 capsule by mouth daily as needed.     DULoxetine (CYMBALTA) 60 MG capsule      estradiol (ESTRACE) 0.5 MG tablet Take 1 tablet (0.5 mg total) by mouth daily. PATIENT WILL NEED AN APPOINTMENT BEFORE ANY FURTHER REFILLS GIVEN 30  tablet 0   fentaNYL (DURAGESIC - DOSED MCG/HR) 50 MCG/HR APPLY 1 PATCH EVERY 72 HOURS. REMOVE OLD PATCH BEFORE APPLYING A NEW ONE.  0   fesoterodine (TOVIAZ) 8 MG TB24 tablet Take 1 tablet (8 mg total) by mouth daily. 30 tablet 2   fluconazole (DIFLUCAN) 150 MG tablet Take 150 mg by mouth daily.  0   FLUoxetine (PROZAC) 40 MG capsule      gabapentin (NEURONTIN) 100 MG capsule Take by mouth.     ketoconazole (NIZORAL) 2 % cream APPLY A SMALL AMOUNT TOPICALLY TWO TIMES A DAY  2   medroxyPROGESTERone (PROVERA) 2.5 MG tablet Take 1 tablet (2.5 mg total) by mouth daily. 30 tablet 2   mirabegron ER (MYRBETRIQ) 50 MG TB24 tablet Take 1 tablet (50 mg total) by mouth daily. 90 tablet 3   mometasone (ELOCON) 0.1 % cream Apply 1 application topically 2 (two) times daily.     mometasone (NASONEX) 50 MCG/ACT nasal spray Place into the nose.     nystatin (MYCOSTATIN/NYSTOP) powder APPLY SMALL AMOUNT TOPICALLY TWO TIMES A DAY AS NEEDED  2   Oxycodone HCl 10 MG TABS Take 10  mg by mouth 4 (four) times daily as needed.     rizatriptan (MAXALT-MLT) 10 MG disintegrating tablet Take by mouth.     rosuvastatin (CRESTOR) 20 MG tablet SMARTSIG:1 Tablet(s) By Mouth Every Evening     STIOLTO RESPIMAT 2.5-2.5 MCG/ACT AERS SMARTSIG:2 Puff(s) Via Inhaler Daily     terbinafine (LAMISIL) 250 MG tablet Take 250 mg by mouth daily.     tiZANidine (ZANAFLEX) 4 MG tablet Take 4 mg by mouth 3 (three) times daily as needed.     valACYclovir (VALTREX) 500 MG tablet Take 500 mg by mouth daily.  4   albuterol (PROVENTIL) (2.5 MG/3ML) 0.083% nebulizer solution INHALE 1 VIAL USING NEBULIZER EVERY SIX HOURS AS NEEDED (Patient not taking: No sig reported)     CHANTIX CONTINUING MONTH PAK 1 MG tablet  (Patient not taking: No sig reported)     clonazePAM (KLONOPIN) 1 MG tablet Take 1 mg by mouth 3 (three) times daily as needed for anxiety.  (Patient not taking: No sig reported)     furosemide (LASIX) 40 MG tablet Take 40 mg by mouth daily  as needed for fluid or edema.  (Patient not taking: No sig reported)  5   pantoprazole (PROTONIX) 40 MG tablet Take 40 mg by mouth daily. (Patient not taking: No sig reported)  2   No current facility-administered medications on file prior to visit.    There are no Patient Instructions on file for this visit. No follow-ups on file.   Kris Hartmann, NP

## 2021-04-25 ENCOUNTER — Emergency Department: Payer: Medicare Other

## 2021-04-25 ENCOUNTER — Emergency Department
Admission: EM | Admit: 2021-04-25 | Discharge: 2021-04-25 | Disposition: A | Discharge status: Home / Self Care | Payer: Medicare Other | Attending: Emergency Medicine | Admitting: Emergency Medicine

## 2021-04-25 ENCOUNTER — Other Ambulatory Visit: Payer: Self-pay

## 2021-04-25 ENCOUNTER — Encounter: Payer: Self-pay | Admitting: Emergency Medicine

## 2021-04-25 DIAGNOSIS — Z7982 Long term (current) use of aspirin: Secondary | ICD-10-CM | POA: Diagnosis not present

## 2021-04-25 DIAGNOSIS — J22 Unspecified acute lower respiratory infection: Secondary | ICD-10-CM | POA: Diagnosis not present

## 2021-04-25 DIAGNOSIS — I10 Essential (primary) hypertension: Secondary | ICD-10-CM | POA: Diagnosis not present

## 2021-04-25 DIAGNOSIS — J45909 Unspecified asthma, uncomplicated: Secondary | ICD-10-CM | POA: Insufficient documentation

## 2021-04-25 DIAGNOSIS — Z7951 Long term (current) use of inhaled steroids: Secondary | ICD-10-CM | POA: Insufficient documentation

## 2021-04-25 DIAGNOSIS — J441 Chronic obstructive pulmonary disease with (acute) exacerbation: Secondary | ICD-10-CM | POA: Diagnosis not present

## 2021-04-25 DIAGNOSIS — Z28310 Unvaccinated for covid-19: Secondary | ICD-10-CM | POA: Diagnosis not present

## 2021-04-25 DIAGNOSIS — U071 COVID-19: Secondary | ICD-10-CM | POA: Insufficient documentation

## 2021-04-25 DIAGNOSIS — Z79899 Other long term (current) drug therapy: Secondary | ICD-10-CM | POA: Diagnosis not present

## 2021-04-25 DIAGNOSIS — F17210 Nicotine dependence, cigarettes, uncomplicated: Secondary | ICD-10-CM | POA: Insufficient documentation

## 2021-04-25 DIAGNOSIS — R509 Fever, unspecified: Secondary | ICD-10-CM | POA: Diagnosis present

## 2021-04-25 LAB — BASIC METABOLIC PANEL
Anion gap: 10 (ref 5–15)
BUN: 8 mg/dL (ref 6–20)
CO2: 28 mmol/L (ref 22–32)
Calcium: 8.3 mg/dL — ABNORMAL LOW (ref 8.9–10.3)
Chloride: 94 mmol/L — ABNORMAL LOW (ref 98–111)
Creatinine, Ser: 0.88 mg/dL (ref 0.44–1.00)
GFR, Estimated: >60 mL/min (ref >60)
Glucose, Bld: 105 mg/dL — ABNORMAL HIGH (ref 70–99)
Potassium: 3.2 mmol/L — ABNORMAL LOW (ref 3.5–5.1)
Sodium: 132 mmol/L — ABNORMAL LOW (ref 135–145)

## 2021-04-25 LAB — TROPONIN I (HIGH SENSITIVITY)
Troponin I (High Sensitivity): 11 ng/L (ref <18)
Troponin I (High Sensitivity): 10 ng/L (ref <18)

## 2021-04-25 LAB — RESP PANEL BY RT-PCR (FLU A&B, COVID) ARPGX2
Influenza A by PCR: NEGATIVE
Influenza B by PCR: NEGATIVE
SARS Coronavirus 2 by RT PCR: POSITIVE — AB

## 2021-04-25 LAB — CBC
HCT: 42.2 % (ref 36.0–46.0)
Hemoglobin: 14.8 g/dL (ref 12.0–15.0)
MCH: 32.8 pg (ref 26.0–34.0)
MCHC: 35.1 g/dL (ref 30.0–36.0)
MCV: 93.6 fL (ref 80.0–100.0)
Platelets: 225 10*3/uL (ref 150–400)
RBC: 4.51 MIL/uL (ref 3.87–5.11)
RDW: 14.8 % (ref 11.5–15.5)
WBC: 10.1 10*3/uL (ref 4.0–10.5)
nRBC: 0 % (ref 0.0–0.2)

## 2021-04-25 MED ORDER — PREDNISONE 10 MG PO TABS: ORAL_TABLET | ORAL | 0 refills | Status: DC

## 2021-04-25 MED ORDER — BENZONATATE 100 MG PO CAPS
100.0000 mg | ORAL_CAPSULE | Freq: Three times a day (TID) | ORAL | 0 refills | Status: DC | PRN
Start: 2021-04-25 — End: 2021-12-23

## 2021-04-25 MED ORDER — ACETAMINOPHEN 325 MG PO TABS
650.0000 mg | ORAL_TABLET | Freq: Once | ORAL | Status: AC | PRN
  Administered 2021-04-25: 650 mg via ORAL
  Filled 2021-04-25: qty 2

## 2021-04-25 NOTE — ED Triage Notes (Signed)
Pt to ED via EMS from home c/o SOB today, hx COPD and smoking, productive thick yellow cough, sore throat and headache.  EMS vitals 152/94 BP, 98% RA, 92 HR.  Pt A&Ox4, chest rise even and unlabored, coughing, skin WNL.

## 2021-04-25 NOTE — ED Notes (Signed)
Patient transported to X-ray 

## 2021-04-25 NOTE — Discharge Instructions (Signed)
As we discussed, although you have tested positive for COVID-19 (coronavirus), you do not need to be hospitalized at this time.  Read through all the included information including the recommendations from the CDC.  We recommend that you self-quarantine at home with your immediate family only (people with whom you have already been in contact) for about a week after your fever has gone away (without taking medication to make your temperature come down, such as Tylenol (acetaminophen)), after your respiratory symptoms have improved.  You should have as minimal contact as possible with anyone else including close family as per the [facility] paperwork guidelines listed below. Follow-up with your doctor by phone or online as needed and return immediately to the emergency department or call 911 only if you develop new or worsening symptoms that concern you.  If you were prescribed any medications, please use them as instructed.  If you were given information for the COVID-19 antibody infusion treatment clinic, please call them and leave your contact information.  This can be a very effective and important treatment method, and you should discuss with them if you qualify for treatment, though unfortunately the treatment is quite limited in supply, so please understand there will be times when this treatment is unavailable.  You can find up-to-date information about COVID-19 in [place] by calling the [place]: [phone]. You may also call 2-1-1, or [phone], or additional resources.  You can also find information online at [email or web address]-[email or web address] or on the Center for Disease Control (CDC) website at BeginnerSteps.be.

## 2021-04-25 NOTE — ED Provider Notes (Signed)
[facility] Emergency Department Provider Note  ____________________________________________   Event Date/Time   First MD Initiated Contact with Patient 04/25/21 0530     (approximate)  I have reviewed the triage vital signs and the nursing notes.   HISTORY  Chief Complaint Shortness of Breath    HPI Bonnie Gomez is a 55 y.o. female with medical history as listed below which notably includes COPD.  She presents for about 8 to 9 days of gradually worsening symptoms including fever, cough, shortness of breath, nausea, and occasional vomiting.  No lower abdominal pain and no chest pain.  She was prescribed prednisone and amoxicillin by her primary care provider completed a course of both but she is now 2.  She has been using her breathing treatments for.  He describes her symptoms as severe.  She also has a mild sore throat and increased fatigue and malaise.  She reports that she has not been vaccinated for COVID-19.  Exertion makes her symptoms worse.     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   Hiatal hernia    Hypertension    Insomnia    Overactive bladder    Pneumonia    2013   Thyroid disease    Vitamin D deficiency     Patient Active Problem List   Diagnosis Date Noted   Swelling of limb 03/04/2021   Essential hypertension 03/04/2021   Pain in limb 03/04/2021   Neck pain 11/17/2017   Post-menopausal 05/03/2017   COPD exacerbation ([place]) 02/29/2016   Condyloma acuminatum in female 04/29/2015   Perineal mass in female 04/11/2015   Anxiety 12/05/2013   Sleep apnea 12/05/2013   Migraine 12/05/2013    Past Surgical History:  Procedure Laterality Date   ANGIOPLASTY  2008, 2012   [place]  2013   [place]   SKIN BIOPSY  2006   [facility]  2013   [place]    Prior to Admission medications   Medication Sig Start Date End Date Taking? Authorizing Provider  benzonatate (TESSALON PERLES) 100 MG capsule Take 1 capsule (100 mg total) by mouth 3 (three) times daily as needed for cough. 04/25/21  Yes Hinda Kehr, MD  predniSONE (DELTASONE) 10 MG tablet Take 4 tabs (40 mg) PO x 3 days, then take 2 tabs (20 mg) PO x 3 days, then take 1 tab (10 mg) PO x 3 days, then take 1/2 tab (5 mg) PO x 4 days. 04/25/21  Yes Hinda Kehr, MD  albuterol (PROVENTIL) (2.5 MG/3ML) 0.083% nebulizer solution INHALE 1 VIAL USING NEBULIZER EVERY SIX HOURS AS NEEDED Patient not taking: No sig reported 04/03/14   [provider]  aspirin EC 81 MG tablet Take 81 mg by mouth every morning.     [provider]  BD INTEGRA SYRINGE 25G X 1" 3 ML MISC USE WITH B12 01/09/15   [provider]  budesonide (PULMICORT) 0.5 MG/2ML nebulizer solution TAKE 2 MLS (0.5 MG TOTAL) BY NEBULIZATION ONCE DAILY. 04/18/17   [provider]  butalbital-acetaminophen-caffeine (FIORICET) 50-325-40 MG tablet Take 1 tablet by mouth every 4 (four) hours as needed  for headache.    [provider]  CHANTIX CONTINUING MONTH PAK 1 MG tablet  09/21/16   [provider]  Cholecalciferol (VITAMIN D3) 5000 units CAPS Take 10,000 Units by mouth every morning.    [provider]  clonazePAM (KLONOPIN) 0.5 MG tablet Take 0.5 mg by mouth 2 (two) times daily as needed. 08/02/19   [provider]  clonazePAM (KLONOPIN) 1 MG tablet Take 1 mg by mouth 3 (three) times daily as needed for anxiety.  Patient not taking: No sig reported    [provider]  colestipol (COLESTID) 1 g tablet  12/11/19   [provider]  DALIRESP 500 MCG TABS tablet Take 500 mcg by mouth every evening.  01/10/16   [provider]  dexlansoprazole (DEXILANT) 60  MG capsule Take 1 capsule by mouth daily as needed. 02/24/21   [provider]  DULoxetine (CYMBALTA) 60 MG capsule  09/21/16   [provider]  estradiol (ESTRACE) 0.5 MG tablet Take 1 tablet (0.5 mg total) by mouth daily. PATIENT WILL NEED AN APPOINTMENT BEFORE ANY FURTHER REFILLS GIVEN 09/28/18   Lawhorn, Lara Mulch, CNM  fentaNYL (DURAGESIC - DOSED MCG/HR) 50 MCG/HR APPLY 1 PATCH EVERY 72 HOURS. REMOVE OLD PATCH BEFORE APPLYING A NEW ONE. 09/10/16   [provider]  fesoterodine (TOVIAZ) 8 MG TB24 tablet Take 1 tablet (8 mg total) by mouth daily. 08/01/18   Bjorn Loser, MD  fluconazole (DIFLUCAN) 150 MG tablet Take 150 mg by mouth daily. 06/02/18   [provider]  FLUoxetine (PROZAC) 40 MG capsule  09/25/16   [provider]  furosemide (LASIX) 40 MG tablet Take 40 mg by mouth daily as needed for fluid or edema.  Patient not taking: No sig reported 04/07/15   [provider]  gabapentin (NEURONTIN) 100 MG capsule Take by mouth. 01/27/21   [provider]  ketoconazole (NIZORAL) 2 % cream APPLY A SMALL AMOUNT TOPICALLY TWO TIMES A DAY 04/08/15   [provider]  medroxyPROGESTERone (PROVERA) 2.5 MG tablet Take 1 tablet (2.5 mg total) by mouth daily. 03/07/18   Lawhorn, Lara Mulch, CNM  mirabegron ER (MYRBETRIQ) 50 MG TB24 tablet Take 1 tablet (50 mg total) by mouth daily. 02/03/21   MacDiarmid, Nicki Reaper, MD  mometasone (ELOCON) 0.1 % cream Apply 1 application topically 2 (two) times daily.    [provider]  mometasone (NASONEX) 50 MCG/ACT nasal spray Place into the nose. 03/16/18   [provider]  nystatin (MYCOSTATIN/NYSTOP) powder APPLY SMALL AMOUNT TOPICALLY TWO TIMES A DAY AS NEEDED 02/23/17   [provider]  Oxycodone HCl 10 MG TABS Take 10 mg by mouth 4 (four) times daily as needed. 03/03/21   [provider]  pantoprazole (PROTONIX) 40 MG tablet Take 40 mg by mouth daily. Patient  not taking: No sig reported 04/07/17   [provider]  rizatriptan (MAXALT-MLT) 10 MG disintegrating tablet Take by mouth. 01/06/20   [provider]  rosuvastatin (CRESTOR) 20 MG tablet SMARTSIG:1 Tablet(s) By Mouth Every Evening 01/30/21   [provider]  STIOLTO RESPIMAT 2.5-2.5 MCG/ACT AERS SMARTSIG:2 Puff(s) Via Inhaler Daily 03/03/21   [provider]  terbinafine (LAMISIL) 250 MG tablet Take 250 mg by mouth daily.    [provider]  tiZANidine (ZANAFLEX) 4 MG tablet Take 4 mg by mouth 3 (three) times daily as needed. 02/08/21   [provider]  valACYclovir (VALTREX) 500 MG tablet Take 500 mg by mouth daily. 01/09/16  [provider]    Allergies Duloxetine, Varenicline, and Duloxetine hcl  Family History  Problem Relation Age of Onset   Cancer Mother        lung   Cancer Father        lung   Cancer Maternal Grandmother        breast   Breast cancer Maternal Grandmother    Scoliosis Brother    Arthritis Brother    Uterine cancer Paternal Grandmother    Breast cancer Cousin    Bladder Cancer Neg Hx    Kidney cancer Neg Hx     Social History Social History   Tobacco Use   Smoking status: Every Day    Packs/day: 1.00    Years: 35.00    Pack years: 35.00    Types: Cigarettes   Smokeless tobacco: Never   Tobacco comments:    "not now "per pt  Vaping Use   Vaping Use: Some days  Substance Use Topics   Alcohol use: No    Alcohol/week: 0.0 standard drinks   Drug use: Yes    Comment: prescribed 50mg  fentanyl patch & 10mg  oxy    Review of Systems Constitutional: Positive for fever and general malaise/fatigue. Eyes: No visual changes. ENT: Positive for sore throat. Cardiovascular: Denies chest pain. Respiratory: Positive for cough and shortness of breath. Gastrointestinal: No abdominal pain.  Positive for nausea and occasional vomiting.  No diarrhea.  No constipation. Genitourinary: Negative for  dysuria. Musculoskeletal: Negative for neck pain.  Negative for back pain. Integumentary: Negative for rash. Neurological: Negative for headaches, focal weakness or numbness.   ____________________________________________   PHYSICAL EXAM:  VITAL SIGNS: ED Triage Vitals  Enc Vitals Group     BP 04/25/21 0335 (!) 151/79     Pulse Rate 04/25/21 0335 92     Resp 04/25/21 0335 20     Temp 04/25/21 0335 (!) 100.5 F (38.1 C)     Temp Source 04/25/21 0335 Oral     SpO2 04/25/21 0335 94 %     Weight 04/25/21 0337 86.2 kg (190 lb)     Height 04/25/21 0337 1.6 m (5\' 3" )     Head Circumference --      Peak Flow --      Pain Score 04/25/21 0337 10     Pain Loc --      Pain Edu? --      Excl. in [place]? --     Constitutional: Alert and oriented.  Eyes: Conjunctivae are normal.  Head: Atraumatic. Nose: No congestion/rhinnorhea. Mouth/Throat: Patient is wearing a mask. Neck: No stridor.  No meningeal signs.   Cardiovascular: Normal rate, regular rhythm. Good peripheral circulation. Respiratory: Occasional thick sounding but nonproductive cough.  Expiratory wheezes throughout but with good air movement. Gastrointestinal: Soft and nontender. No distention.  Musculoskeletal: No lower extremity tenderness nor edema. No gross deformities of extremities. Neurologic:  Normal speech and language. No gross focal neurologic deficits are appreciated.  Skin:  Skin is warm, dry and intact. Psychiatric: Mood and affect are normal. Speech and behavior are normal.  ____________________________________________   LABS (all labs ordered are listed, but only abnormal results are displayed)  Labs Reviewed  RESP PANEL BY RT-PCR (FLU A&B, COVID) ARPGX2 - Abnormal; Notable for the following components:      Result Value   SARS Coronavirus 2 by RT PCR POSITIVE (*)    All other components within normal limits  BASIC METABOLIC PANEL - Abnormal; Notable for the following  components:   Sodium 132 (*)     Potassium 3.2 (*)    Chloride 94 (*)    Glucose, Bld 105 (*)    Calcium 8.3 (*)    All other components within normal limits  CBC  TROPONIN I (HIGH SENSITIVITY)  TROPONIN I (HIGH SENSITIVITY)   ____________________________________________  EKG  ED ECG REPORT I, Hinda Kehr, the attending physician, personally viewed and interpreted this ECG.  Date: 04/25/2021 EKG Time: 3:43 AM Rate: 93 Rhythm: normal sinus rhythm QRS Axis: Left axis deviation Intervals: normal except for borderline LVH ST/T Wave abnormalities: Non-specific ST segment / T-wave changes, but no clear evidence of acute ischemia. Narrative Interpretation: no definitive evidence of acute ischemia; does not meet STEMI criteria.  ____________________________________________  RADIOLOGY I, Hinda Kehr, personally viewed and evaluated these images (plain radiographs) as part of my medical decision making, as well as reviewing the written report by the radiologist.  ED MD interpretation: No acute abnormalities but with chronic bronchitis  Official radiology report(s): DG Chest 2 View  Result Date: 04/25/2021 CLINICAL DATA:  Shortness of breath EXAM: CHEST - 2 VIEW COMPARISON:  01/14/2021 FINDINGS: Cardiac shadow is mildly prominent but stable. Lungs are well aerated bilaterally. Bronchitic markings are again identified similar to that seen on the prior exam of a chronic nature. No focal infiltrate or sizable effusion is seen. No acute bony abnormality is noted. IMPRESSION: Chronic bronchitic markings without acute abnormality. Electronically Signed   By: Inez Catalina M.D.   On: 04/25/2021 03:58    ____________________________________________   PROCEDURES   Procedure(s) performed (including Critical Care):  .1-3 Lead EKG Interpretation Performed by: Hinda Kehr, MD Authorized by: Hinda Kehr, MD     Interpretation: normal     ECG rate:  90   ECG rate assessment: normal     Rhythm: sinus rhythm      Ectopy: none     Conduction: normal     ____________________________________________   INITIAL IMPRESSION / MDM / ASSESSMENT AND PLAN / ED COURSE  As part of my medical decision making, I reviewed the following data within the [place] notes reviewed and incorporated, Labs reviewed , EKG interpreted , Old chart reviewed, Radiograph reviewed , and Notes from prior ED visits   Differential diagnosis includes, but is not limited to, COPD exacerbation, pneumonia, COVID-19 or other respiratory virus.  The patient is on the cardiac monitor to evaluate for evidence of arrhythmia and/or significant heart rate changes.  Vital signs are stable and within normal limits including no evidence of hypoxemia.  I personally reviewed the patient's imaging and agree with the radiologist's interpretation that there is no evidence of acute pneumonia.  EKG is without evidence of ischemia.  Basic metabolic panel generally reassuring.  CBC normal.  High-sensitivity troponin within normal limits x2.  Respiratory viral panel is positive for COVID-19.  I have dated the patient.  Given her COPD I am giving her a prednisone taper and encouraged her to use her breathing treatments.  She is outside of the window of any possible benefit from paxlovid.  She will follow-up as an outpatient and I gave my usual and customary management recommendations and return precautions.         ____________________________________________  FINAL CLINICAL IMPRESSION(S) / ED DIAGNOSES  Final diagnoses:  Lower respiratory tract infection due to COVID-19 virus     MEDICATIONS GIVEN DURING THIS VISIT:  Medications  acetaminophen (TYLENOL) tablet 650 mg (650 mg Oral Given 04/25/21  0347)     ED Discharge Orders          Ordered    predniSONE (DELTASONE) 10 MG tablet        04/25/21 0648    benzonatate (TESSALON PERLES) 100 MG capsule  3 times daily PRN        04/25/21 7672             Note:   This document was prepared using Dragon voice recognition software and may include unintentional dictation errors.   Hinda Kehr, MD 04/25/21 [phone]

## 2021-04-25 NOTE — ED Notes (Signed)
First contact: Pt speaking in full sentences with no resp distress noted. Pt endorses thick yellow sputum begins today. States that she continues to smoke "half a pack a day and use my apple fritter vape pen".   In bed with call light in reach.

## 2021-06-17 ENCOUNTER — Ambulatory Visit (INDEPENDENT_AMBULATORY_CARE_PROVIDER_SITE_OTHER): Payer: Medicare Other | Admitting: Vascular Surgery

## 2021-06-17 ENCOUNTER — Other Ambulatory Visit: Payer: Self-pay

## 2021-06-17 VITALS — BP 151/83 | HR 81 | Ht 62.0 in | Wt 184.0 lb

## 2021-06-17 DIAGNOSIS — M79604 Pain in right leg: Secondary | ICD-10-CM

## 2021-06-17 DIAGNOSIS — I10 Essential (primary) hypertension: Secondary | ICD-10-CM | POA: Diagnosis not present

## 2021-06-17 DIAGNOSIS — I890 Lymphedema, not elsewhere classified: Secondary | ICD-10-CM | POA: Diagnosis not present

## 2021-06-17 NOTE — Progress Notes (Signed)
MRN : [id number]  Bonnie Gomez is a 55 y.o. (17-Sep-1965) female who presents with chief complaint of  Chief Complaint  Patient presents with   Follow-up    3 Mo no studies  .  History of Present Illness: Patient returns today in follow up of her leg swelling.  She has been wearing her compression stockings which has improved but not resolved her lower extremity swelling.  She still has persistent swelling most days and notices this particularly in the evenings.  Her stockings leave a deep indentions when she takes them off.  Both legs are affected.  Her previously performed venous duplex showed only saphenofemoral junction reflux on the right with no DVT or superficial thrombophlebitis in either lower extremity.  Current Outpatient Medications  Medication Sig Dispense Refill   albuterol (PROVENTIL) (2.5 MG/3ML) 0.083% nebulizer solution INHALE 1 VIAL USING NEBULIZER EVERY SIX HOURS AS NEEDED     aspirin EC 81 MG tablet Take 81 mg by mouth every morning.      BD INTEGRA SYRINGE 25G X 1" 3 ML MISC USE WITH B12  5   benzonatate (TESSALON PERLES) 100 MG capsule Take 1 capsule (100 mg total) by mouth 3 (three) times daily as needed for cough. 30 capsule 0   budesonide (PULMICORT) 0.5 MG/2ML nebulizer solution TAKE 2 MLS (0.5 MG TOTAL) BY NEBULIZATION ONCE DAILY.  11   butalbital-acetaminophen-caffeine (FIORICET) 50-325-40 MG tablet Take 1 tablet by mouth every 4 (four) hours as needed for headache.     CHANTIX CONTINUING MONTH PAK 1 MG tablet      Cholecalciferol (VITAMIN D3) 5000 units CAPS Take 10,000 Units by mouth every morning.     clonazePAM (KLONOPIN) 0.5 MG tablet Take 0.5 mg by mouth 2 (two) times daily as needed.     clonazePAM (KLONOPIN) 1 MG tablet Take 1 mg by mouth 3 (three) times daily as needed for anxiety.     colestipol (COLESTID) 1 g tablet      DALIRESP 500 MCG TABS tablet Take 500 mcg by mouth every evening.   2   dexlansoprazole (DEXILANT) 60 MG capsule Take 1  capsule by mouth daily as needed.     DULoxetine (CYMBALTA) 60 MG capsule      estradiol (ESTRACE) 0.5 MG tablet Take 1 tablet (0.5 mg total) by mouth daily. PATIENT WILL NEED AN APPOINTMENT BEFORE ANY FURTHER REFILLS GIVEN 30 tablet 0   fentaNYL (DURAGESIC - DOSED MCG/HR) 50 MCG/HR APPLY 1 PATCH EVERY 72 HOURS. REMOVE OLD PATCH BEFORE APPLYING A NEW ONE.  0   fesoterodine (TOVIAZ) 8 MG TB24 tablet Take 1 tablet (8 mg total) by mouth daily. 30 tablet 2   fluconazole (DIFLUCAN) 150 MG tablet Take 150 mg by mouth daily.  0   FLUoxetine (PROZAC) 40 MG capsule      furosemide (LASIX) 40 MG tablet Take 40 mg by mouth daily as needed for fluid or edema.  5   gabapentin (NEURONTIN) 100 MG capsule Take by mouth.     ketoconazole (NIZORAL) 2 % cream APPLY A SMALL AMOUNT TOPICALLY TWO TIMES A DAY  2   medroxyPROGESTERone (PROVERA) 2.5 MG tablet Take 1 tablet (2.5 mg total) by mouth daily. 30 tablet 2   mirabegron ER (MYRBETRIQ) 50 MG TB24 tablet Take 1 tablet (50 mg total) by mouth daily. 90 tablet 3   mometasone (ELOCON) 0.1 % cream Apply 1 application topically 2 (two) times daily.     mometasone (NASONEX) 50 MCG/ACT  nasal spray Place into the nose.     nystatin (MYCOSTATIN/NYSTOP) powder APPLY SMALL AMOUNT TOPICALLY TWO TIMES A DAY AS NEEDED  2   oxyCODONE-acetaminophen (PERCOCET) 10-325 MG tablet Take 1 tablet by mouth every 4 (four) hours as needed for pain.     pantoprazole (PROTONIX) 40 MG tablet Take 40 mg by mouth daily.  2   predniSONE (DELTASONE) 10 MG tablet Take 4 tabs (40 mg) PO x 3 days, then take 2 tabs (20 mg) PO x 3 days, then take 1 tab (10 mg) PO x 3 days, then take 1/2 tab (5 mg) PO x 4 days. 23 tablet 0   rizatriptan (MAXALT-MLT) 10 MG disintegrating tablet Take by mouth.     rosuvastatin (CRESTOR) 20 MG tablet SMARTSIG:1 Tablet(s) By Mouth Every Evening     STIOLTO RESPIMAT 2.5-2.5 MCG/ACT AERS SMARTSIG:2 Puff(s) Via Inhaler Daily     terbinafine (LAMISIL) 250 MG tablet Take 250 mg  by mouth daily.     tiZANidine (ZANAFLEX) 4 MG tablet Take 4 mg by mouth 3 (three) times daily as needed.     valACYclovir (VALTREX) 500 MG tablet Take 500 mg by mouth daily.  4   No current facility-administered medications for this visit.    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   Hiatal hernia    Hypertension    Insomnia    Overactive bladder    Pneumonia    2013   Thyroid disease    Vitamin D deficiency     Past Surgical History:  Procedure Laterality Date   ANGIOPLASTY  2008, 2012   [place]   COLONOSCOPY  2013   [place]   SKIN BIOPSY  2006   TUBAL LIGATION  1992   UPPER GI ENDOSCOPY  2013   [place]     Social History   Tobacco Use   Smoking status: Every Day    Packs/day: 1.00    Years: 35.00    Pack years: 35.00    Types: Cigarettes   Smokeless tobacco: Never   Tobacco comments:    "not now "per pt  Vaping Use   Vaping Use: Some days  Substance Use Topics   Alcohol use: No    Alcohol/week: 0.0 standard drinks   Drug use: Yes    Comment: prescribed 50mg  fentanyl patch & 10mg  oxy      Family History  Problem Relation Age of Onset   Cancer Mother        lung   Cancer Father        lung   Cancer Maternal Grandmother        breast   Breast cancer Maternal Grandmother    Scoliosis Brother    Arthritis Brother    Uterine cancer Paternal Grandmother    Breast cancer Cousin    Bladder Cancer Neg Hx    Kidney cancer Neg Hx      Allergies  Allergen Reactions   Duloxetine Other (See Comments)    Agitation   Varenicline Other (See Comments)    Agitation   Duloxetine Hcl Anxiety    Agitation    REVIEW OF SYSTEMS (Negative unless checked)   Constitutional: [] Weight loss  []   Fever  [] Chills Cardiac: [] Chest  pain   [] Chest pressure   [] Palpitations   [] Shortness of breath when laying flat   [] Shortness of breath at rest   [] Shortness of breath with exertion. Vascular:  [] Pain in legs with walking   [] Pain in legs at rest   [] Pain in legs when laying flat   [] Claudication   [] Pain in feet when walking  [] Pain in feet at rest  [] Pain in feet when laying flat   [] History of DVT   [] Phlebitis   [] Swelling in legs   [] Varicose veins   [] Non-healing ulcers Pulmonary:   [] Uses home oxygen   [] Productive cough   [] Hemoptysis   [] Wheeze  [x] COPD   [x] Asthma Neurologic:  [] Dizziness  [] Blackouts   [] Seizures   [] History of stroke   [] History of TIA  [] Aphasia   [] Temporary blindness   [] Dysphagia   [] Weakness or numbness in arms   [] Weakness or numbness in legs Musculoskeletal:  [x] Arthritis   [] Joint swelling   [] Joint pain   [x] Low back pain Hematologic:  [] Easy bruising  [] Easy bleeding   [] Hypercoagulable state   [] Anemic  [] Hepatitis Gastrointestinal:  [] Blood in stool   [] Vomiting blood  [x] Gastroesophageal reflux/heartburn   [] Abdominal pain Genitourinary:  [] Chronic kidney disease   [] Difficult urination  [] Frequent urination  [] Burning with urination   [] Hematuria Skin:  [] Rashes   [] Ulcers   [] Wounds Psychological:  [x] History of anxiety   []  History of major depression.  Physical Examination  BP (!) 151/83   Pulse 81   Ht 5\' 2"  (1.575 m)   Wt 184 lb (83.5 kg)   BMI 33.65 kg/m  Gen:  WD/WN, NAD Head: [place]/AT, No temporalis wasting. Ear/Nose/Throat: Hearing grossly intact, nares w/o erythema or drainage Eyes: Conjunctiva clear. Sclera non-icteric Neck: Supple.  Trachea midline Pulmonary:  Good air movement, no use of accessory muscles.  Cardiac: RRR, no JVD Vascular:  Vessel Right Left  Radial Palpable Palpable                          PT Palpable Palpable  DP Palpable Palpable   Gastrointestinal: soft, non-tender/non-distended. No guarding/reflex.  Musculoskeletal: M/S 5/5  throughout.  No deformity or atrophy.  1+ bilateral lower extremity edema. Neurologic: Sensation grossly intact in extremities.  Symmetrical.  Speech is fluent.  Psychiatric: Judgment intact, Mood & affect appropriate for pt's clinical situation. Dermatologic: No rashes or ulcers noted.  No cellulitis or open wounds.      Labs Recent Results (from the past 2160 hour(s))  Basic metabolic panel     Status: Abnormal   Collection Time: 04/25/21  3:39 AM  Result Value Ref Range   Sodium 132 (L) 135 - 145 mmol/L   Potassium 3.2 (L) 3.5 - 5.1 mmol/L   Chloride 94 (L) 98 - 111 mmol/L   CO2 28 22 - 32 mmol/L   Glucose, Bld 105 (H) 70 - 99 mg/dL    Comment: Glucose reference range applies only to samples taken after fasting for at least 8 hours.   BUN 8 6 - 20 mg/dL   Creatinine, Ser 0.88 0.44 - 1.00 mg/dL   Calcium 8.3 (L) 8.9 - 10.3 mg/dL   GFR, Estimated >60 >60 mL/min    Comment: (NOTE) Calculated using the CKD-EPI Creatinine Equation (2021)    Anion gap 10 5 - 15    Comment: Performed at [facility], [address], [place], [place] [address]  CBC  Status: None   Collection Time: 04/25/21  3:39 AM  Result Value Ref Range   WBC 10.1 4.0 - 10.5 K/uL   RBC 4.51 3.87 - 5.11 MIL/uL   Hemoglobin 14.8 12.0 - 15.0 g/dL   HCT 42.2 36.0 - 46.0 %   MCV 93.6 80.0 - 100.0 fL   MCH 32.8 26.0 - 34.0 pg   MCHC 35.1 30.0 - 36.0 g/dL   RDW 14.8 11.5 - 15.5 %   Platelets 225 150 - 400 K/uL   nRBC 0.0 0.0 - 0.2 %    Comment: Performed at [facility], [place], [place] [address]  Troponin I (High Sensitivity)     Status: None   Collection Time: 04/25/21  3:39 AM  Result Value Ref Range   Troponin I (High Sensitivity) 11 <18 ng/L    Comment: (NOTE) Elevated high sensitivity troponin I (hsTnI) values and significant  changes across serial measurements may suggest ACS but many other  chronic and acute conditions are known to elevate hsTnI results.  Refer  to the "Links" section for chest pain algorithms and additional  guidance. Performed at [facility], [place], [place] [address]   Troponin I (High Sensitivity)     Status: None   Collection Time: 04/25/21  5:39 AM  Result Value Ref Range   Troponin I (High Sensitivity) 10 <18 ng/L    Comment: (NOTE) Elevated high sensitivity troponin I (hsTnI) values and significant  changes across serial measurements may suggest ACS but many other  chronic and acute conditions are known to elevate hsTnI results.  Refer to the "Links" section for chest pain algorithms and additional  guidance. Performed at [facility], [address] [place], [place] [address]   Resp Panel by RT-PCR (Flu A&B, Covid) Nasopharyngeal Swab     Status: Abnormal   Collection Time: 04/25/21  5:39 AM   Specimen: Nasopharyngeal Swab; Nasopharyngeal(NP) swabs in vial transport medium  Result Value Ref Range   SARS Coronavirus 2 by RT PCR POSITIVE (A) NEGATIVE    Comment: RESULT CALLED TO, READ BACK BY AND VERIFIED WITH: [facility] NIVALA RN [phone] 04/25/21 HNM (NOTE) SARS-CoV-2 target nucleic acids are DETECTED.  The SARS-CoV-2 RNA is generally detectable in upper respiratory specimens during the acute phase of infection. Positive results are indicative of the presence of the identified virus, but do not rule out bacterial infection or co-infection with other pathogens not detected by the test. Clinical correlation with patient history and other diagnostic information is necessary to determine patient infection status. The expected result is Negative.  Fact Sheet for Patients: EntrepreneurPulse.com.au  Fact Sheet for Healthcare Providers: IncredibleEmployment.be  This test is not yet approved or cleared by the [place] FDA and  has been authorized for detection and/or diagnosis of SARS-CoV-2 by FDA under an Emergency Use Authorization (EUA).   This EUA will remain in effect (meaning this test can be  used) for the duration of  the COVID-19 declaration under Section 564(b)(1) of the Act, 21 U.S.C. section 360bbb-3(b)(1), unless the authorization is terminated or revoked sooner.     Influenza A by PCR NEGATIVE NEGATIVE   Influenza B by PCR NEGATIVE NEGATIVE    Comment: (NOTE) The Xpert Xpress SARS-CoV-2/FLU/RSV plus assay is intended as an aid in the diagnosis of influenza from Nasopharyngeal swab specimens and should not be used as a sole basis for treatment. Nasal washings and aspirates are unacceptable for Xpert Xpress SARS-CoV-2/FLU/RSV testing.  Fact Sheet for Patients: EntrepreneurPulse.com.au  Fact Sheet for Healthcare Providers: IncredibleEmployment.be  This test is not yet approved or cleared by the [place] FDA and has been authorized for detection and/or diagnosis of SARS-CoV-2 by FDA under an Emergency Use Authorization (EUA). This EUA will remain in effect (meaning this test can be used) for the duration of the COVID-19 declaration under Section 564(b)(1) of the Act, 21 U.S.C. section 360bbb-3(b)(1), unless the authorization is terminated or revoked.  Performed at [facility], [address], [place], [place] [address]     Radiology No results found.  Assessment/Plan Essential hypertension blood pressure control important in reducing the progression of atherosclerotic disease. On appropriate oral medications.     Pain in limb Patient has significant right lower extremity pain.  This is likely multifactorial but I suspect there is a significant neuropathic component from her back.  There may be a musculoskeletal component.  She has had a negative arterial study already.  Venous disease was minimal.  Lymphedema may be playing a component.  Lymphedema At this point, the patient has swelling refractory to compression and elevation.  Minimal venous  disease.  A lymphedema pump will be an excellent adjuvant therapy for her stage II lymphedema.  I discussed the pathophysiology and natural history of lymphedema.  She voices her understanding.  We will try to get that approved in the near future.  We will see her back in 6 months.    Leotis Pain, MD  06/17/2021 4:52 PM    This note was created with Dragon medical transcription system.  Any errors from dictation are purely unintentional

## 2021-06-17 NOTE — Assessment & Plan Note (Signed)
At this point, the patient has swelling refractory to compression and elevation.  Minimal venous disease.  A lymphedema pump will be an excellent adjuvant therapy for her stage II lymphedema.  I discussed the pathophysiology and natural history of lymphedema.  She voices her understanding.  We will try to get that approved in the near future.  We will see her back in 6 months.

## 2021-09-02 ENCOUNTER — Encounter: Payer: Self-pay | Admitting: Emergency Medicine

## 2021-09-02 ENCOUNTER — Other Ambulatory Visit: Payer: Self-pay

## 2021-09-02 ENCOUNTER — Emergency Department
Admission: EM | Admit: 2021-09-02 | Discharge: 2021-09-02 | Disposition: A | Discharge status: Home / Self Care | Payer: Medicare Other | Attending: Emergency Medicine | Admitting: Emergency Medicine

## 2021-09-02 DIAGNOSIS — J45909 Unspecified asthma, uncomplicated: Secondary | ICD-10-CM | POA: Insufficient documentation

## 2021-09-02 DIAGNOSIS — J449 Chronic obstructive pulmonary disease, unspecified: Secondary | ICD-10-CM | POA: Insufficient documentation

## 2021-09-02 DIAGNOSIS — I10 Essential (primary) hypertension: Secondary | ICD-10-CM | POA: Diagnosis not present

## 2021-09-02 DIAGNOSIS — G8929 Other chronic pain: Secondary | ICD-10-CM | POA: Diagnosis not present

## 2021-09-02 DIAGNOSIS — M5440 Lumbago with sciatica, unspecified side: Secondary | ICD-10-CM | POA: Diagnosis present

## 2021-09-02 DIAGNOSIS — M779 Enthesopathy, unspecified: Secondary | ICD-10-CM

## 2021-09-02 DIAGNOSIS — M6790 Unspecified disorder of synovium and tendon, unspecified site: Secondary | ICD-10-CM | POA: Insufficient documentation

## 2021-09-02 DIAGNOSIS — M5441 Lumbago with sciatica, right side: Secondary | ICD-10-CM

## 2021-09-02 MED ORDER — FENTANYL 50 MCG/HR TD PT72
1.0000 | MEDICATED_PATCH | TRANSDERMAL | Status: DC
  Administered 2021-09-02: 1 via TRANSDERMAL
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
## 2021-09-09 ENCOUNTER — Other Ambulatory Visit: Payer: Self-pay | Admitting: Family Medicine

## 2021-09-09 DIAGNOSIS — Z1231 Encounter for screening mammogram for malignant neoplasm of breast: Secondary | ICD-10-CM

## 2021-09-17 ENCOUNTER — Telehealth: Payer: Self-pay

## 2021-09-17 NOTE — Telephone Encounter (Signed)
Pt stopped by clinic for [place] samples. We do not have samples and do not get them often. Her insurance does not cover toviaz. Can we call in a different medication? ?

## 2021-09-18 MED ORDER — TOLTERODINE TARTRATE ER 4 MG PO CP24: 4.0000 mg | ORAL_CAPSULE | Freq: Every day | ORAL | 11 refills | Status: AC

## 2021-09-18 NOTE — Telephone Encounter (Signed)
Medication sent to pharmacy  

## 2021-09-19 NOTE — Telephone Encounter (Signed)
Pt aware will pick up rx  ?

## 2021-09-23 ENCOUNTER — Other Ambulatory Visit: Payer: Self-pay | Admitting: Family Medicine

## 2021-09-23 DIAGNOSIS — M810 Age-related osteoporosis without current pathological fracture: Secondary | ICD-10-CM

## 2021-11-11 ENCOUNTER — Ambulatory Visit: Payer: Medicare Other | Attending: Specialist

## 2021-11-11 DIAGNOSIS — G4733 Obstructive sleep apnea (adult) (pediatric): Secondary | ICD-10-CM | POA: Diagnosis present

## 2021-12-11 ENCOUNTER — Ambulatory Visit
Admission: RE | Admit: 2021-12-11 | Discharge: 2021-12-11 | Disposition: A | Discharge status: Home / Self Care | Payer: Medicare Other | Source: Ambulatory Visit | Attending: Family Medicine | Admitting: Family Medicine

## 2021-12-11 DIAGNOSIS — Z1231 Encounter for screening mammogram for malignant neoplasm of breast: Secondary | ICD-10-CM | POA: Diagnosis present

## 2021-12-11 DIAGNOSIS — M810 Age-related osteoporosis without current pathological fracture: Secondary | ICD-10-CM

## 2021-12-16 ENCOUNTER — Encounter (INDEPENDENT_AMBULATORY_CARE_PROVIDER_SITE_OTHER): Payer: Self-pay

## 2021-12-16 ENCOUNTER — Ambulatory Visit (INDEPENDENT_AMBULATORY_CARE_PROVIDER_SITE_OTHER): Payer: Medicare Other | Admitting: Vascular Surgery

## 2021-12-23 ENCOUNTER — Encounter (INDEPENDENT_AMBULATORY_CARE_PROVIDER_SITE_OTHER): Payer: Self-pay | Admitting: Vascular Surgery

## 2021-12-23 ENCOUNTER — Ambulatory Visit (INDEPENDENT_AMBULATORY_CARE_PROVIDER_SITE_OTHER): Payer: Medicare Other | Admitting: Vascular Surgery

## 2021-12-23 VITALS — BP 160/88 | HR 75 | Resp 18 | Ht 63.0 in | Wt 174.0 lb

## 2021-12-23 DIAGNOSIS — I890 Lymphedema, not elsewhere classified: Secondary | ICD-10-CM | POA: Diagnosis not present

## 2021-12-23 DIAGNOSIS — I10 Essential (primary) hypertension: Secondary | ICD-10-CM | POA: Diagnosis not present

## 2021-12-23 DIAGNOSIS — M79604 Pain in right leg: Secondary | ICD-10-CM | POA: Diagnosis not present

## 2021-12-23 NOTE — Assessment & Plan Note (Signed)
The patient has stage II lymphedema with swelling refractory to compression and elevation.  At this point, she has been doing conservative measures for well over 6 months and her symptoms are persistent.  She would benefit from a lymphedema pump and we will try to get that approved and obtained for her in the near future at her convenience.  As for her numbness in the hand, depending on what her nerve conduction studies show she may need to see a neurosurgeon for cervical spine disease versus a hand surgeon for a peripheral neuropathy or carpal tunnel issue.  I will see her back in 6 months.

## 2021-12-23 NOTE — Progress Notes (Signed)
MRN : [id number]  Bonnie Gomez is a 56 y.o. (Aug 29, 1965) female who presents with chief complaint of No chief complaint on file. Marland Kitchen  History of Present Illness: Patient returns today in follow up of leg swelling and lymphedema.  She is still having leg swelling and discomfort most days despite the use of compression socks, elevation, and trying to be more active for over 6 months now.  She is also having worsening numbness and pain in her hands.  She is to have a nerve conduction study in the near future to evaluate this.  Her swelling has been intermittently better with stockings and elevation, but is persistent and bothersome.  She never received the lymphedema pump that we had recommended at her last visit.  Current Outpatient Medications  Medication Sig Dispense Refill   albuterol (PROVENTIL) (2.5 MG/3ML) 0.083% nebulizer solution INHALE 1 VIAL USING NEBULIZER EVERY SIX HOURS AS NEEDED     aspirin EC 81 MG tablet Take 81 mg by mouth every morning.      BD INTEGRA SYRINGE 25G X 1" 3 ML MISC USE WITH B12  5   butalbital-acetaminophen-caffeine (FIORICET) 50-325-40 MG tablet Take 1 tablet by mouth every 4 (four) hours as needed for headache.     Cholecalciferol (VITAMIN D3) 5000 units CAPS Take 10,000 Units by mouth every morning.     clonazePAM (KLONOPIN) 0.5 MG tablet Take 0.5 mg by mouth 2 (two) times daily as needed.     colestipol (COLESTID) 1 g tablet      DALIRESP 500 MCG TABS tablet Take 500 mcg by mouth every evening.   2   dexlansoprazole (DEXILANT) 60 MG capsule Take 1 capsule by mouth daily as needed.     DULoxetine (CYMBALTA) 60 MG capsule      estradiol (ESTRACE) 0.5 MG tablet Take 1 tablet (0.5 mg total) by mouth daily. PATIENT WILL NEED AN APPOINTMENT BEFORE ANY FURTHER REFILLS GIVEN 30 tablet 0   fentaNYL (DURAGESIC - DOSED MCG/HR) 50 MCG/HR APPLY 1 PATCH EVERY 72 HOURS. REMOVE OLD PATCH BEFORE APPLYING A NEW ONE.  0   FLUoxetine (PROZAC) 40 MG capsule      Fluticasone  Furoate (ARNUITY ELLIPTA) 100 MCG/ACT AEPB Inhale into the lungs.     furosemide (LASIX) 40 MG tablet Take 40 mg by mouth daily as needed for fluid or edema.  5   gabapentin (NEURONTIN) 100 MG capsule Take by mouth.     ketoconazole (NIZORAL) 2 % cream APPLY A SMALL AMOUNT TOPICALLY TWO TIMES A DAY  2   medroxyPROGESTERone (PROVERA) 2.5 MG tablet Take 1 tablet (2.5 mg total) by mouth daily. 30 tablet 2   mometasone (ELOCON) 0.1 % cream Apply 1 application topically 2 (two) times daily.     mometasone (NASONEX) 50 MCG/ACT nasal spray Place into the nose.     nystatin (MYCOSTATIN/NYSTOP) powder APPLY SMALL AMOUNT TOPICALLY TWO TIMES A DAY AS NEEDED  2   oxyCODONE-acetaminophen (PERCOCET) 10-325 MG tablet Take 1 tablet by mouth every 4 (four) hours as needed for pain.     pantoprazole (PROTONIX) 40 MG tablet Take 40 mg by mouth daily.  2   rizatriptan (MAXALT-MLT) 10 MG disintegrating tablet Take by mouth.     rosuvastatin (CRESTOR) 20 MG tablet SMARTSIG:1 Tablet(s) By Mouth Every Evening     STIOLTO RESPIMAT 2.5-2.5 MCG/ACT AERS SMARTSIG:2 Puff(s) Via Inhaler Daily     terbinafine (LAMISIL) 250 MG tablet Take 250 mg by mouth daily.  tiZANidine (ZANAFLEX) 4 MG tablet Take 4 mg by mouth 3 (three) times daily as needed.     tolterodine (DETROL LA) 4 MG 24 hr capsule Take 4 mg by mouth daily.     valACYclovir (VALTREX) 500 MG tablet Take 500 mg by mouth daily.  4   No current facility-administered medications for this visit.    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   Hiatal hernia    Hypertension    Insomnia    Overactive bladder    Pneumonia    2013   Thyroid disease    Vitamin D deficiency     Past Surgical History:  Procedure Laterality Date   ANGIOPLASTY  2008, 2012    [place]   COLONOSCOPY  2013   [place]   SKIN BIOPSY  2006   TUBAL LIGATION  1992   UPPER GI ENDOSCOPY  2013   [place]     Social History   Tobacco Use   Smoking status: Every Day    Packs/day: 1.00    Years: 35.00    Pack years: 35.00    Types: Cigarettes   Smokeless tobacco: Never   Tobacco comments:    "not now "per pt  Vaping Use   Vaping Use: Some days  Substance Use Topics   Alcohol use: No    Alcohol/week: 0.0 standard drinks   Drug use: Yes    Comment: prescribed '50mg'$  fentanyl patch & '10mg'$  oxy      Family History  Problem Relation Age of Onset   Cancer Mother        lung   Cancer Father        lung   Cancer Maternal Grandmother        breast   Breast cancer Maternal Grandmother    Scoliosis Brother    Arthritis Brother    Uterine cancer Paternal Grandmother    Breast cancer Cousin    Bladder Cancer Neg Hx    Kidney cancer Neg Hx      Allergies  Allergen Reactions   Duloxetine Other (See Comments)    Agitation   Varenicline Other (See Comments)    Agitation   Duloxetine Hcl Anxiety    Agitation    REVIEW OF SYSTEMS (Negative unless checked)   Constitutional: '[]'$ Weight loss  '[]'$ Fever  '[]'$ Chills Cardiac: '[]'$ Chest pain   '[]'$ Chest pressure   '[]'$ Palpitations   '[]'$ Shortness of breath when laying flat   '[]'$ Shortness of breath at rest   '[]'$ Shortness of breath with exertion. Vascular:  '[]'$ Pain in legs with walking   '[]'$ Pain in legs at rest   '[]'$ Pain in legs when laying flat   '[]'$ Claudication   '[]'$ Pain in feet when walking  '[]'$ Pain in feet at rest  '[]'$ Pain in feet when laying flat   '[]'$ History of DVT   '[]'$ Phlebitis   '[]'$ Swelling in legs   '[]'$ Varicose veins   '[]'$ Non-healing ulcers Pulmonary:   '[]'$ Uses home oxygen   '[]'$ Productive cough   '[]'$ Hemoptysis   '[]'$ Wheeze  '[x]'$ COPD   '[x]'$ Asthma Neurologic:  '[]'$ Dizziness  '[]'$ Blackouts   '[]'$ Seizures   '[]'$ History of stroke   '[]'$ History of TIA  '[]'$ Aphasia   '[]'$ Temporary blindness   '[]'$ Dysphagia   '[]'$ Weakness  or numbness  in arms   '[]'$ Weakness or numbness in legs Musculoskeletal:  '[x]'$ Arthritis   '[]'$ Joint swelling   '[]'$ Joint pain   '[x]'$ Low back pain Hematologic:  '[]'$ Easy bruising  '[]'$ Easy bleeding   '[]'$ Hypercoagulable state   '[]'$ Anemic  '[]'$ Hepatitis Gastrointestinal:  '[]'$ Blood in stool   '[]'$ Vomiting blood  '[x]'$ Gastroesophageal reflux/heartburn   '[]'$ Abdominal pain Genitourinary:  '[]'$ Chronic kidney disease   '[]'$ Difficult urination  '[]'$ Frequent urination  '[]'$ Burning with urination   '[]'$ Hematuria Skin:  '[]'$ Rashes   '[]'$ Ulcers   '[]'$ Wounds Psychological:  '[x]'$ History of anxiety   '[]'$  History of major depression.  Physical Examination  BP (!) 160/88 (BP Location: Right Arm)   Pulse 75   Resp 18   Ht '5\' 3"'$  (1.6 m)   Wt 174 lb (78.9 kg)   BMI 30.82 kg/m  Gen:  WD/WN, NAD Head: [place]/AT, No temporalis wasting. Ear/Nose/Throat: Hearing grossly intact, nares w/o erythema or drainage Eyes: Conjunctiva clear. Sclera non-icteric Neck: Supple.  Trachea midline Pulmonary:  Good air movement, no use of accessory muscles.  Cardiac: RRR, no JVD Vascular:  Vessel Right Left  Radial Palpable Palpable       Musculoskeletal: M/S 5/5 throughout.  No deformity or atrophy.  1+ bilateral lower extremity edema. Neurologic: Sensation grossly intact in extremities.  Symmetrical.  Speech is fluent.  Psychiatric: Judgment intact, Mood & affect appropriate for pt's clinical situation. Dermatologic: No rashes or ulcers noted.  No cellulitis or open wounds.      Labs No results found for this or any previous visit (from the past 2160 hour(s)).  Radiology DG BONE DENSITY (DXA)  Result Date: 12/11/2021 EXAM: DUAL X-RAY ABSORPTIOMETRY (DXA) FOR BONE MINERAL DENSITY IMPRESSION: Your patient Rivers Gassmann completed a BMD test on 12/11/2021 using the [place] (software version: 14.10) manufactured by [employer]. The following summarizes the results of our evaluation. Technologist: [facility] PATIENT BIOGRAPHICAL: Name: Mittie, Knittel  Patient ID: [id number] Birth Date: 04/02/1966 Height: 62.0 in. Gender: Female Exam Date: 12/11/2021 Weight: 172.1 lbs. Indications: Caucasian, COPD, Postmenopausal, Tobacco User Fractures: Treatments: Estrace, Progesterone, Vitamin D DENSITOMETRY RESULTS: Site         Region     Measured Date Measured Age WHO Classification Young Adult T-score BMD         %Change vs. Previous Significant Change (*) AP Spine L1-L2 12/11/2021 57.4 Normal 0.0 1.173 g/cm2 DualFemur Neck Right 12/11/2021 57.4 Normal -0.8 0.921 g/cm2 Left Forearm Radius 33% 12/11/2021 57.4 Normal 0.3 0.903 g/cm2 ASSESSMENT: The BMD measured at Femur Neck Right is 0.921 g/cm2 with a T-score of -0.8. This patient is considered normal according to [facility]) criteria. The scan quality is good. L-3 & 4 was excluded due to degenerative changes. [facility]) criteria for post-menopausal, Caucasian Women: Normal:                   T-score at or above -1 SD Osteopenia/low bone mass: T-score between -1 and -2.5 SD Osteoporosis:             T-score at or below -2.5 SD RECOMMENDATIONS: 1. All patients should optimize calcium and vitamin D intake. 2. Consider FDA-approved medical therapies in postmenopausal women and men aged 7 years and older, based on the following: a. A hip or vertebral(clinical or morphometric) fracture b. T-score < -2.5 at the femoral neck or spine after appropriate evaluation to exclude secondary causes c. Low bone mass (T-score between -1.0 and -2.5 at the femoral neck or  spine) and a 10-year probability of a hip fracture > 3% or a 10-year probability of a major osteoporosis-related fracture > 20% based on the [place]-adapted WHO algorithm 3. Clinician judgment and/or patient preferences may indicate treatment for people with 10-year fracture probabilities above or below these levels FOLLOW-UP: People with diagnosed cases of osteoporosis or at high risk for fracture should have regular bone mineral density  tests. For patients eligible for Medicare, routine testing is allowed once every 2 years. The testing frequency can be increased to one year for patients who have rapidly progressing disease, those who are receiving or discontinuing medical therapy to restore bone mass, or have additional risk factors. I have reviewed this report, and agree with the above findings. [facility] Electronically Signed   By: Elmer Picker M.D.   On: 12/11/2021 14:30   MM 3D SCREEN BREAST BILATERAL  Result Date: 12/12/2021 CLINICAL DATA:  Screening. EXAM: DIGITAL SCREENING BILATERAL MAMMOGRAM WITH TOMOSYNTHESIS AND CAD TECHNIQUE: Bilateral screening digital craniocaudal and mediolateral oblique mammograms were obtained. Bilateral screening digital breast tomosynthesis was performed. The images were evaluated with computer-aided detection. COMPARISON:  Previous exam(s). ACR Breast Density Category b: There are scattered areas of fibroglandular density. FINDINGS: There are no findings suspicious for malignancy. IMPRESSION: No mammographic evidence of malignancy. A result letter of this screening mammogram will be mailed directly to the patient. RECOMMENDATION: Screening mammogram in one year. (Code:SM-B-01Y) BI-RADS CATEGORY  1: Negative. Electronically Signed   By: Dorise Bullion III M.D.   On: 12/12/2021 14:57   SLEEP STUDY DOCUMENTS  Result Date: 11/27/2021 Ordered by an unspecified provider.  SLEEP STUDY DOCUMENTS  Result Date: 11/27/2021 Ordered by an unspecified provider.   Assessment/Plan Essential hypertension blood pressure control important in reducing the progression of atherosclerotic disease. On appropriate oral medications.     Pain in limb Patient has significant right lower extremity pain.  This is likely multifactorial but I suspect there is a significant neuropathic component from her back.  There may be a musculoskeletal component.  She has had a negative arterial study already.   Venous disease was minimal.  Lymphedema may be playing a component.  Lymphedema The patient has stage II lymphedema with swelling refractory to compression and elevation.  At this point, she has been doing conservative measures for well over 6 months and her symptoms are persistent.  She would benefit from a lymphedema pump and we will try to get that approved and obtained for her in the near future at her convenience.  As for her numbness in the hand, depending on what her nerve conduction studies show she may need to see a neurosurgeon for cervical spine disease versus a hand surgeon for a peripheral neuropathy or carpal tunnel issue.  I will see her back in 6 months.    Leotis Pain, MD  12/23/2021 2:09 PM    This note was created with Dragon medical transcription system.  Any errors from dictation are purely unintentional

## 2022-02-09 ENCOUNTER — Ambulatory Visit (INDEPENDENT_AMBULATORY_CARE_PROVIDER_SITE_OTHER): Payer: Medicare Other | Admitting: Urology

## 2022-02-09 ENCOUNTER — Ambulatory Visit: Payer: Self-pay | Admitting: Urology

## 2022-02-09 ENCOUNTER — Encounter: Payer: Self-pay | Admitting: Urology

## 2022-02-09 VITALS — BP 132/86 | HR 72 | Ht 63.0 in | Wt 177.0 lb

## 2022-02-09 DIAGNOSIS — N3946 Mixed incontinence: Secondary | ICD-10-CM

## 2022-02-09 DIAGNOSIS — N3941 Urge incontinence: Secondary | ICD-10-CM

## 2022-02-09 LAB — MICROSCOPIC EXAMINATION

## 2022-02-09 LAB — URINALYSIS, COMPLETE
Bilirubin, UA: NEGATIVE
Glucose, UA: NEGATIVE
Ketones, UA: NEGATIVE
Leukocytes,UA: NEGATIVE
Nitrite, UA: NEGATIVE
Protein,UA: NEGATIVE
RBC, UA: NEGATIVE
Specific Gravity, UA: 1.015 (ref 1.005–1.030)
Urobilinogen, Ur: 0.2 mg/dL (ref 0.2–1.0)
pH, UA: 5.5 (ref 5.0–7.5)

## 2022-02-09 MED ORDER — TOLTERODINE TARTRATE ER 4 MG PO CP24: 4.0000 mg | ORAL_CAPSULE | Freq: Every day | ORAL | 3 refills | Status: DC

## 2022-02-09 MED ORDER — MIRABEGRON ER 50 MG PO TB24: 50.0000 mg | ORAL_TABLET | Freq: Every day | ORAL | 3 refills | Status: DC

## 2022-02-09 NOTE — Progress Notes (Signed)
02/09/2022 3:16 PM   Bonnie Gomez [birth year] [id number]  Referring provider: Remi Haggard, [place],  [place] [id number]  No chief complaint on file.   HPI: Reviewed old chart and added the following Today Incontinence much better.  Still does some bathroom mapping.  Still leaks but much better on the combination.  Insurance would not cover Toviaz.  They will cover Myrbetriq and Myrbetriq prescription renewed.  2 months 8 mg Apples given.  She can come by and get samples when she wants.  See me in a year.  Clinically not infected today      Urge incontinence dramatically improved on Toviaz and Myrbetriq.  90x3 Myrbetriq sent.  She comes in and gets Toviaz samples.  Clinically not infected.  Frequency stable.   Today Incontinence dramatically better on a combination of Detrol and Myrbetriq.  Frequency stable.  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 If you have any questions, ask your nurse or doctor.          albuterol (2.5 MG/3ML) 0.083% nebulizer  solution Commonly known as: PROVENTIL INHALE 1 VIAL USING NEBULIZER EVERY SIX HOURS AS NEEDED   Arnuity Ellipta 100 MCG/ACT Aepb Generic drug: Fluticasone Furoate Inhale into the lungs.   aspirin EC 81 MG tablet Take 81 mg by mouth every morning.   BD Integra Syringe 25G X 1" 3 ML Misc Generic drug: SYRINGE-NEEDLE (DISP) 3 ML USE WITH B12   butalbital-acetaminophen-caffeine 50-325-40 MG tablet Commonly known as: FIORICET Take 1 tablet by mouth every 4 (four) hours as needed for headache.   clonazePAM 0.5 MG tablet Commonly known as: KLONOPIN Take 0.5 mg by mouth 2 (two) times daily as needed.   colestipol 1 g tablet Commonly known as: COLESTID   Daliresp 500 MCG Tabs tablet Generic drug: roflumilast Take 500 mcg by mouth every evening.   dexlansoprazole 60 MG capsule Commonly known as: DEXILANT Take 1 capsule by mouth daily as needed.   DULoxetine 60 MG capsule Commonly known as: CYMBALTA   estradiol 0.5 MG tablet Commonly known as: ESTRACE Take 1 tablet (0.5 mg total) by mouth daily. PATIENT WILL NEED AN APPOINTMENT BEFORE ANY FURTHER REFILLS GIVEN   fentaNYL 50 MCG/HR Commonly known as: [place] 1  PATCH EVERY 72 HOURS. REMOVE OLD PATCH BEFORE APPLYING A NEW ONE.   FLUoxetine 40 MG capsule Commonly known as: PROZAC   furosemide 40 MG tablet Commonly known as: LASIX Take 40 mg by mouth daily as needed for fluid or edema.   gabapentin 100 MG capsule Commonly known as: NEURONTIN Take by mouth.   ketoconazole 2 % cream Commonly known as: NIZORAL APPLY A SMALL AMOUNT TOPICALLY TWO TIMES A DAY   medroxyPROGESTERone 2.5 MG tablet Commonly known as: PROVERA Take 1 tablet (2.5 mg total) by mouth daily.   mometasone 0.1 % cream Commonly known as: ELOCON Apply 1 application topically 2 (two) times daily.   mometasone 50 MCG/ACT nasal spray Commonly known as: NASONEX Place into the nose.   nystatin powder Commonly known as: MYCOSTATIN/NYSTOP APPLY  SMALL AMOUNT TOPICALLY TWO TIMES A DAY AS NEEDED   oxyCODONE-acetaminophen 10-325 MG tablet Commonly known as: PERCOCET Take 1 tablet by mouth every 4 (four) hours as needed for pain.   pantoprazole 40 MG tablet Commonly known as: PROTONIX Take 40 mg by mouth daily.   rizatriptan 10 MG disintegrating tablet Commonly known as: MAXALT-MLT Take by mouth.   rosuvastatin 20 MG tablet Commonly known as: CRESTOR SMARTSIG:1 Tablet(s) By Mouth Every Evening   Stiolto Respimat 2.5-2.5 MCG/ACT Aers Generic drug: Tiotropium Bromide-Olodaterol SMARTSIG:2 Puff(s) Via Inhaler Daily   terbinafine 250 MG tablet Commonly known as: LAMISIL Take 250 mg by mouth daily.   tiZANidine 4 MG tablet Commonly known as: ZANAFLEX Take 4 mg by mouth 3 (three) times daily as needed.   tolterodine 4 MG 24 hr capsule Commonly known as: DETROL LA Take 4 mg by mouth daily.   valACYclovir 500 MG tablet Commonly known as: VALTREX Take 500 mg by mouth daily.   Vitamin D3 125 MCG (5000 UT) Caps Take 10,000 Units by mouth every morning.        Allergies:  Allergies  Allergen Reactions   Duloxetine Other (See Comments)    Agitation   Varenicline Other (See Comments)    Agitation   Duloxetine Hcl Anxiety    Agitation    Family History: Family History  Problem Relation Age of Onset   Cancer Mother        lung   Cancer Father        lung   Cancer Maternal Grandmother        breast   Breast cancer Maternal Grandmother    Scoliosis Brother    Arthritis Brother    Uterine cancer Paternal Grandmother    Breast cancer Cousin    Bladder Cancer Neg Hx    Kidney cancer Neg Hx     Social History:  reports that she has been smoking cigarettes. She has a 35.00 pack-year smoking history. She has never used smokeless tobacco. She reports current drug use. She reports that she does not drink alcohol.  ROS:                                        Physical Exam: There were  no vitals taken for this visit.  Constitutional:  Alert and oriented, No acute distress. HEENT: [place] AT, moist mucus membranes.  Trachea midline, no masses.   Laboratory Data: Lab Results  Component Value Date   WBC 10.1 04/25/2021   HGB 14.8 04/25/2021   HCT 42.2 04/25/2021   MCV 93.6 04/25/2021   PLT  225 04/25/2021    Lab Results  Component Value Date   CREATININE 0.88 04/25/2021    No results found for: "PSA"  Lab Results  Component Value Date   TESTOSTERONE 19 06/17/2017    No results found for: "HGBA1C"  Urinalysis    Component Value Date/Time   COLORURINE YELLOW (A) 10/01/2016 1540   APPEARANCEUR Hazy (A) 07/03/2019 1528   LABSPEC 1.008 10/01/2016 1540   LABSPEC 1.018 10/18/2012 1346   PHURINE 5.0 10/01/2016 1540   GLUCOSEU Negative 07/03/2019 1528   GLUCOSEU Negative 10/18/2012 1346   HGBUR NEGATIVE 10/01/2016 1540   BILIRUBINUR Negative 07/03/2019 1528   BILIRUBINUR Negative 10/18/2012 1346   KETONESUR NEGATIVE 10/01/2016 1540   PROTEINUR Negative 07/03/2019 1528   PROTEINUR NEGATIVE 10/01/2016 1540   NITRITE Negative 07/03/2019 1528   NITRITE NEGATIVE 10/01/2016 1540   LEUKOCYTESUR Negative 07/03/2019 1528   LEUKOCYTESUR Negative 10/18/2012 1346    Pertinent Imaging:   Assessment & Plan: Both prescriptions renewed 90x3 and I will see in 1 year  There are no diagnoses linked to this encounter.  No follow-ups on file.  Reece Packer, MD  [facility] [address] [place], [place] [address] [phone]

## 2022-02-17 ENCOUNTER — Other Ambulatory Visit: Payer: Self-pay | Admitting: *Deleted

## 2022-02-17 DIAGNOSIS — Z87891 Personal history of nicotine dependence: Secondary | ICD-10-CM

## 2022-02-17 DIAGNOSIS — Z122 Encounter for screening for malignant neoplasm of respiratory organs: Secondary | ICD-10-CM

## 2022-02-17 DIAGNOSIS — F17210 Nicotine dependence, cigarettes, uncomplicated: Secondary | ICD-10-CM

## 2022-03-03 ENCOUNTER — Ambulatory Visit (INDEPENDENT_AMBULATORY_CARE_PROVIDER_SITE_OTHER): Payer: Medicare Other | Admitting: Acute Care

## 2022-03-03 ENCOUNTER — Encounter: Payer: Self-pay | Admitting: Acute Care

## 2022-03-03 DIAGNOSIS — F17210 Nicotine dependence, cigarettes, uncomplicated: Secondary | ICD-10-CM

## 2022-03-03 NOTE — Progress Notes (Signed)
Virtual Visit via Telephone Note  I connected with Bonnie Gomez on 03/03/22 at  2:30 PM EDT by telephone and verified that I am speaking with the correct person using two identifiers.  Location: Patient:  At Home Provider: [place], [place], [place], [address]    I discussed the limitations, risks, security and privacy concerns of performing an evaluation and management service by telephone and the availability of in person appointments. I also discussed with the patient that there may be a patient responsible charge related to this service. The patient expressed understanding and agreed to proceed.   Shared Decision Making Visit Lung Cancer Screening Program [phone])   Eligibility: Age 56 y.o. Pack Years Smoking History Calculation 32 pack year smoking history (# packs/per year x # years smoked) Recent History of coughing up blood  no Unexplained weight loss? no ( >Than 15 pounds within the last 6 months ) Prior History Lung / other cancer no (Diagnosis within the last 5 years already requiring surveillance chest CT Scans). Smoking Status Current Smoker Former Smokers: Years since quit:  NA  Quit Date:  NA  Visit Components: Discussion included one or more decision making aids. yes Discussion included risk/benefits of screening. yes Discussion included potential follow up diagnostic testing for abnormal scans. yes Discussion included meaning and risk of over diagnosis. yes Discussion included meaning and risk of False Positives. yes Discussion included meaning of total radiation exposure. yes  Counseling Included: Importance of adherence to annual lung cancer LDCT screening. yes Impact of comorbidities on ability to participate in the program. yes Ability and willingness to under diagnostic treatment. yes  Smoking Cessation Counseling: Current Smokers:  Discussed importance of smoking cessation. yes Information about tobacco cessation classes and  interventions provided to patient. yes Patient provided with "ticket" for LDCT Scan. yes Symptomatic Patient. no  Counseling NA Diagnosis Code: Tobacco Use Z72.0 Asymptomatic Patient yes  Counseling (Intermediate counseling: > three minutes counseling) V0350 Former Smokers:  Discussed the importance of maintaining cigarette abstinence. yes Diagnosis Code: Personal History of Nicotine Dependence. K93.818 Information about tobacco cessation classes and interventions provided to patient. Yes Patient provided with "ticket" for LDCT Scan. yes Written Order for Lung Cancer Screening with LDCT placed in Epic. Yes (CT Chest Lung Cancer Screening Low Dose W/O CM) [id number] Z12.2-Screening of respiratory organs Z87.891-Personal history of nicotine dependence  I have spent 25 minutes of face to face/ virtual visit   time with  Bonnie Gomez  discussing the risks and benefits of lung cancer screening. We viewed / discussed a power point together that explained in detail the above noted topics. We paused at intervals to allow for questions to be asked and answered to ensure understanding.We discussed that the single most powerful action that she can take to decrease her risk of developing lung cancer is to quit smoking. We discussed whether or not she is ready to commit to setting a quit date. We discussed options for tools to aid in quitting smoking including nicotine replacement therapy, non-nicotine medications, support groups, Quit Smart classes, and behavior modification. We discussed that often times setting smaller, more achievable goals, such as eliminating 1 cigarette a day for a week and then 2 cigarettes a day for a week can be helpful in slowly decreasing the number of cigarettes smoked. This allows for a sense of accomplishment as well as providing a clinical benefit. I provided  her  with smoking cessation  information  with contact information for community resources,  classes, free nicotine  replacement therapy, and access to mobile apps, text messaging, and on-line smoking cessation help. I have also provided  her  the office contact information in the event she needs to contact me, or the screening staff. We discussed the time and location of the scan, and that either Doroteo Glassman RN, Joella Prince, RN  or I will call / send a letter with the results within 24-72 hours of receiving them. The patient verbalized understanding of all of  the above and had no further questions upon leaving the office. They have my contact information in the event they have any further questions.  I spent 3 minutes counseling on smoking cessation and the health risks of continued tobacco abuse.  I explained to the patient that there has been a high incidence of coronary artery disease noted on these exams. I explained that this is a non-gated exam therefore degree or severity cannot be determined. This patient is on statin therapy. I have asked the patient to follow-up with their PCP regarding any incidental finding of coronary artery disease and management with diet or medication as their PCP  feels is clinically indicated. The patient verbalized understanding of the above and had no further questions upon completion of the visit.     Magdalen Spatz, NP 03/03/2022

## 2022-03-03 NOTE — Patient Instructions (Signed)
Thank you for participating in the [facility]. It was our pleasure to meet you today. We will call you with the results of your scan within the next few days. Your scan will be assigned a Lung RADS category score by the physicians reading the scans.  This Lung RADS score determines follow up scanning.  See below for description of categories, and follow up screening recommendations. We will be in touch to schedule your follow up screening annually or based on recommendations of our providers. We will fax a copy of your scan results to your Primary Care Physician, or the physician who referred you to the program, to ensure they have the results. Please call the office if you have any questions or concerns regarding your scanning experience or results.  Our office number is [phone]. Please speak with Denise Phelps, RN. , or  Denise Buckner RN, They are  our Lung Cancer Screening RN.'s If They are unavailable when you call, Please leave a message on the voice mail. We will return your call at our earliest convenience.This voice mail is monitored several times a day.  Remember, if your scan is normal, we will scan you annually as long as you continue to meet the criteria for the program. (Age 55-77, Current smoker or smoker who has quit within the last 15 years). If you are a smoker, remember, quitting is the single most powerful action that you can take to decrease your risk of lung cancer and other pulmonary, breathing related problems. We know quitting is hard, and we are here to help.  Please let us know if there is anything we can do to help you meet your goal of quitting. If you are a former smoker, congratulations. We are proud of you! Remain smoke free! Remember you can refer friends or family members through the number above.  We will screen them to make sure they meet criteria for the program. Thank you for helping us take better care of you by  participating in Lung Screening.  You can receive free nicotine replacement therapy ( patches, gum or mints) by calling 1-800-QUIT NOW. Please call so we can get you on the path to becoming  a non-smoker. I know it is hard, but you can do this!  Lung RADS Categories:  Lung RADS 1: no nodules or definitely non-concerning nodules.  Recommendation is for a repeat annual scan in 12 months.  Lung RADS 2:  nodules that are non-concerning in appearance and behavior with a very low likelihood of becoming an active cancer. Recommendation is for a repeat annual scan in 12 months.  Lung RADS 3: nodules that are probably non-concerning , includes nodules with a low likelihood of becoming an active cancer.  Recommendation is for a 6-month repeat screening scan. Often noted after an upper respiratory illness. We will be in touch to make sure you have no questions, and to schedule your 6-month scan.  Lung RADS 4 A: nodules with concerning findings, recommendation is most often for a follow up scan in 3 months or additional testing based on our provider's assessment of the scan. We will be in touch to make sure you have no questions and to schedule the recommended 3 month follow up scan.  Lung RADS 4 B:  indicates findings that are concerning. We will be in touch with you to schedule additional diagnostic testing based on our provider's  assessment of the scan.  Other options for assistance in smoking cessation (   As covered by your insurance benefits)  Hypnosis for smoking cessation  [employer] [phone]  Acupuncture for smoking cessation  [facility] [phone]   

## 2022-03-04 ENCOUNTER — Ambulatory Visit
Admission: RE | Admit: 2022-03-04 | Discharge: 2022-03-04 | Disposition: A | Discharge status: Home / Self Care | Payer: Medicare Other | Source: Ambulatory Visit | Attending: Acute Care | Admitting: Acute Care

## 2022-03-04 DIAGNOSIS — F17210 Nicotine dependence, cigarettes, uncomplicated: Secondary | ICD-10-CM | POA: Diagnosis present

## 2022-03-04 DIAGNOSIS — Z87891 Personal history of nicotine dependence: Secondary | ICD-10-CM | POA: Insufficient documentation

## 2022-03-04 DIAGNOSIS — Z122 Encounter for screening for malignant neoplasm of respiratory organs: Secondary | ICD-10-CM | POA: Insufficient documentation

## 2022-03-09 ENCOUNTER — Other Ambulatory Visit: Payer: Self-pay

## 2022-03-09 DIAGNOSIS — Z122 Encounter for screening for malignant neoplasm of respiratory organs: Secondary | ICD-10-CM

## 2022-03-09 DIAGNOSIS — F17210 Nicotine dependence, cigarettes, uncomplicated: Secondary | ICD-10-CM

## 2022-03-09 DIAGNOSIS — Z87891 Personal history of nicotine dependence: Secondary | ICD-10-CM

## 2022-05-18 ENCOUNTER — Encounter (INDEPENDENT_AMBULATORY_CARE_PROVIDER_SITE_OTHER): Payer: Self-pay

## 2022-06-30 ENCOUNTER — Other Ambulatory Visit (INDEPENDENT_AMBULATORY_CARE_PROVIDER_SITE_OTHER): Payer: Self-pay | Admitting: Nurse Practitioner

## 2022-06-30 DIAGNOSIS — M7989 Other specified soft tissue disorders: Secondary | ICD-10-CM

## 2022-07-01 ENCOUNTER — Ambulatory Visit (INDEPENDENT_AMBULATORY_CARE_PROVIDER_SITE_OTHER): Payer: Medicare Other

## 2022-07-01 DIAGNOSIS — M7989 Other specified soft tissue disorders: Secondary | ICD-10-CM | POA: Diagnosis not present

## 2022-07-03 ENCOUNTER — Ambulatory Visit (INDEPENDENT_AMBULATORY_CARE_PROVIDER_SITE_OTHER): Payer: Medicare Other | Admitting: Vascular Surgery

## 2022-07-03 ENCOUNTER — Encounter (INDEPENDENT_AMBULATORY_CARE_PROVIDER_SITE_OTHER): Payer: Self-pay | Admitting: Vascular Surgery

## 2022-07-03 VITALS — BP 164/101 | HR 67 | Resp 16 | Wt 198.0 lb

## 2022-07-03 DIAGNOSIS — L03119 Cellulitis of unspecified part of limb: Secondary | ICD-10-CM

## 2022-07-03 DIAGNOSIS — M79604 Pain in right leg: Secondary | ICD-10-CM | POA: Diagnosis not present

## 2022-07-03 DIAGNOSIS — I10 Essential (primary) hypertension: Secondary | ICD-10-CM | POA: Diagnosis not present

## 2022-07-03 DIAGNOSIS — I872 Venous insufficiency (chronic) (peripheral): Secondary | ICD-10-CM | POA: Diagnosis not present

## 2022-07-03 DIAGNOSIS — I890 Lymphedema, not elsewhere classified: Secondary | ICD-10-CM

## 2022-07-03 DIAGNOSIS — L02419 Cutaneous abscess of limb, unspecified: Secondary | ICD-10-CM | POA: Insufficient documentation

## 2022-07-03 MED ORDER — CEPHALEXIN 500 MG PO CAPS: 500.0000 mg | ORAL_CAPSULE | Freq: Three times a day (TID) | ORAL | 0 refills | Status: AC

## 2022-07-03 NOTE — Assessment & Plan Note (Signed)
blood pressure control important in reducing the progression of atherosclerotic disease. On appropriate oral medications.  

## 2022-07-03 NOTE — Assessment & Plan Note (Signed)
She recently had a venous duplex which shows minimal reflux in the proximal right great saphenous vein without DVT or superficial thrombophlebitis.  Despite this, her symptoms are worsening.  I think this is largely lymphedema and have to try to get a lymphedema pump and going to make referral to I can perform a laser ablation of the right great saphenous vein, but honestly I would not expect this to make a dramatic improvement.

## 2022-07-03 NOTE — Assessment & Plan Note (Signed)
She recently had a venous duplex which shows minimal reflux in the proximal right great saphenous vein without DVT or superficial thrombophlebitis.  Despite this, her symptoms are worsening.  I think this is largely lymphedema and have to try to get a lymphedema pump and going to make referral to the lymphedema clinic.  I can perform a laser ablation of the right great saphenous vein, but honestly I would not expect this to make a dramatic improvement.

## 2022-07-03 NOTE — Progress Notes (Signed)
MRN : [id number]  Bonnie Gomez is a 56 y.o. (18-Jul-1966) female who presents with chief complaint of  Chief Complaint  Patient presents with   Follow-up    Ultrasound results  .  History of Present Illness: Patient returns today in follow up of leg pain and swelling.  Her legs are more swollen today.  Both are kind of the red tender to the touch is concerning for cellulitis particularly on the right side.  No trauma or injury.  She has been doubling up on her Lasix with no improvement in her symptoms.  We have tried to get a lymphedema pump on 2 occasions without success.  She wears her 20 to 30 mmHg compression socks daily tries to elevate her legs.  Her activity level has gotten much worse because of her painful swollen legs.  She recently had a venous duplex which shows minimal reflux in the proximal right great saphenous vein without DVT or superficial thrombophlebitis.  Current Outpatient Medications  Medication Sig Dispense Refill   albuterol (PROVENTIL) (2.5 MG/3ML) 0.083% nebulizer solution INHALE 1 VIAL USING NEBULIZER EVERY SIX HOURS AS NEEDED     aspirin EC 81 MG tablet Take 81 mg by mouth every morning.      BD INTEGRA SYRINGE 25G X 1" 3 ML MISC USE WITH B12  5   butalbital-acetaminophen-caffeine (FIORICET) 50-325-40 MG tablet Take 1 tablet by mouth every 4 (four) hours as needed for headache.     Cholecalciferol (VITAMIN D3) 5000 units CAPS Take 10,000 Units by mouth every morning.     clonazePAM (KLONOPIN) 0.5 MG tablet Take 0.5 mg by mouth 2 (two) times daily as needed.     colestipol (COLESTID) 1 g tablet      DALIRESP 500 MCG TABS tablet Take 500 mcg by mouth every evening.   2   dexlansoprazole (DEXILANT) 60 MG capsule Take 1 capsule by mouth daily as needed.     DULoxetine (CYMBALTA) 60 MG capsule      estradiol (ESTRACE) 0.5 MG tablet Take 1 tablet (0.5 mg total) by mouth daily. PATIENT WILL NEED AN APPOINTMENT BEFORE ANY FURTHER REFILLS GIVEN 30 tablet 0    fentaNYL (DURAGESIC - DOSED MCG/HR) 50 MCG/HR 1 patch every other day.  0   FLUoxetine (PROZAC) 40 MG capsule      Fluticasone Furoate (ARNUITY ELLIPTA) 100 MCG/ACT AEPB Inhale into the lungs.     furosemide (LASIX) 40 MG tablet Take 40 mg by mouth daily as needed for fluid or edema.  5   gabapentin (NEURONTIN) 100 MG capsule Take by mouth.     ketoconazole (NIZORAL) 2 % cream APPLY A SMALL AMOUNT TOPICALLY TWO TIMES A DAY  2   medroxyPROGESTERone (PROVERA) 2.5 MG tablet Take 1 tablet (2.5 mg total) by mouth daily. 30 tablet 2   mirabegron ER (MYRBETRIQ) 50 MG TB24 tablet Take 1 tablet (50 mg total) by mouth daily. 90 tablet 3   mometasone (ELOCON) 0.1 % cream Apply 1 application topically 2 (two) times daily.     mometasone (NASONEX) 50 MCG/ACT nasal spray Place into the nose.     nystatin (MYCOSTATIN/NYSTOP) powder APPLY SMALL AMOUNT TOPICALLY TWO TIMES A DAY AS NEEDED  2   oxyCODONE-acetaminophen (PERCOCET) 10-325 MG tablet Take 1 tablet by mouth every 4 (four) hours as needed for pain.     pantoprazole (PROTONIX) 40 MG tablet Take 40 mg by mouth daily.  2   rizatriptan (MAXALT-MLT) 10 MG disintegrating tablet Take  by mouth.     rosuvastatin (CRESTOR) 20 MG tablet SMARTSIG:1 Tablet(s) By Mouth Every Evening     STIOLTO RESPIMAT 2.5-2.5 MCG/ACT AERS SMARTSIG:2 Puff(s) Via Inhaler Daily     terbinafine (LAMISIL) 250 MG tablet Take 250 mg by mouth daily.     tiZANidine (ZANAFLEX) 4 MG tablet Take 4 mg by mouth 3 (three) times daily as needed.     tolterodine (DETROL LA) 4 MG 24 hr capsule Take 1 capsule (4 mg total) by mouth daily. 90 capsule 3   valACYclovir (VALTREX) 500 MG tablet Take 500 mg by mouth daily.  4   cephALEXin (KEFLEX) 500 MG capsule Take 1 capsule (500 mg total) by mouth 3 (three) times daily. 21 capsule 0   No current facility-administered medications for this visit.    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   Hiatal hernia    Hypertension    Insomnia    Overactive bladder    Pneumonia    2013   Thyroid disease    Vitamin D deficiency     Past Surgical History:  Procedure Laterality Date   ANGIOPLASTY  2008, 2012   [place]   COLONOSCOPY  2013   [place]   SKIN BIOPSY  2006   TUBAL LIGATION  1992   UPPER GI ENDOSCOPY  2013   [place]     Social History   Tobacco Use   Smoking status: Every Day    Packs/day: 1.00    Years: 35.00    Total pack years: 35.00    Types: Cigarettes    Passive exposure: Current   Smokeless tobacco: Never   Tobacco comments:    "not now "per pt  Vaping Use   Vaping Use: Some days  Substance Use Topics   Alcohol use: No    Alcohol/week: 0.0 standard drinks of alcohol   Drug use: Yes    Comment: prescribed '50mg'$  fentanyl patch & '10mg'$  oxy      Family History  Problem Relation Age of Onset   Cancer Mother        lung   Cancer Father        lung   Cancer Maternal Grandmother        breast   Breast cancer Maternal Grandmother    Scoliosis Brother    Arthritis Brother    Uterine cancer Paternal Grandmother    Breast cancer Cousin    Bladder Cancer Neg Hx    Kidney cancer Neg Hx      Allergies  Allergen Reactions   Duloxetine Other (See Comments)    Agitation   Varenicline Other (See Comments)    Agitation   Duloxetine Hcl Anxiety    Agitation     REVIEW OF SYSTEMS (Negative unless checked)   Constitutional: '[]'$ Weight loss  '[]'$ Fever  '[]'$ Chills Cardiac: '[]'$ Chest pain   '[]'$ Chest pressure   '[]'$ Palpitations   '[]'$ Shortness of breath when laying flat   '[]'$ Shortness of breath at rest   '[]'$ Shortness of breath with exertion. Vascular:  '[]'$ Pain in legs with walking   '[]'$ Pain in legs at rest   '[]'$ Pain in legs when laying flat   '[]'$ Claudication   '[]'$ Pain in feet when  walking   '[]'$ Pain in feet at rest  '[]'$ Pain in feet when laying flat   '[]'$ History of DVT   '[]'$ Phlebitis   '[]'$ Swelling in legs   '[]'$ Varicose veins   '[]'$ Non-healing ulcers Pulmonary:   '[]'$ Uses home oxygen   '[]'$ Productive cough   '[]'$ Hemoptysis   '[]'$ Wheeze  '[x]'$ COPD   '[x]'$ Asthma Neurologic:  '[]'$ Dizziness  '[]'$ Blackouts   '[]'$ Seizures   '[]'$ History of stroke   '[]'$ History of TIA  '[]'$ Aphasia   '[]'$ Temporary blindness   '[]'$ Dysphagia   '[]'$ Weakness or numbness in arms   '[]'$ Weakness or numbness in legs Musculoskeletal:  '[x]'$ Arthritis   '[]'$ Joint swelling   '[]'$ Joint pain   '[x]'$ Low back pain Hematologic:  '[]'$ Easy bruising  '[]'$ Easy bleeding   '[]'$ Hypercoagulable state   '[]'$ Anemic  '[]'$ Hepatitis Gastrointestinal:  '[]'$ Blood in stool   '[]'$ Vomiting blood  '[x]'$ Gastroesophageal reflux/heartburn   '[]'$ Abdominal pain Genitourinary:  '[]'$ Chronic kidney disease   '[]'$ Difficult urination  '[]'$ Frequent urination  '[]'$ Burning with urination   '[]'$ Hematuria Skin:  '[]'$ Rashes   '[]'$ Ulcers   '[]'$ Wounds Psychological:  '[x]'$ History of anxiety   '[]'$  History of major depression.  Physical Examination  BP (!) 164/101 (BP Location: Left Arm)   Pulse 67   Resp 16   Wt 198 lb (89.8 kg)   BMI 35.07 kg/m  Gen:  WD/WN, NAD.  Appears older than stated age Head: [place]/AT, No temporalis wasting. Ear/Nose/Throat: Hearing grossly intact, nares w/o erythema or drainage Eyes: Conjunctiva clear. Sclera non-icteric Neck: Supple.  Trachea midline Pulmonary:  Good air movement, no use of accessory muscles.  Cardiac: RRR, no JVD Vascular:  Vessel Right Left  Radial Palpable Palpable       Musculoskeletal: M/S 5/5 throughout.  No deformity or atrophy. 2+ BLE edema.  Both legs are somewhat erythematous and tender to the touch worse on the right than the left. Neurologic: Sensation grossly intact in extremities.  Symmetrical.  Speech is fluent.  Psychiatric: Judgment intact, Mood & affect appropriate for pt's clinical situation. Dermatologic: No rashes or ulcers noted.  Lower extremities with findings concerning  for cellulitis.      Labs No results found for this or any previous visit (from the past 2160 hour(s)).  Radiology VAS [place] LOWER EXTREMITY VENOUS REFLUX  Result Date: 07/02/2022  Lower Venous Reflux Study Patient Name:  Bonnie Gomez  Date of Exam:   07/01/2022 Medical Rec #: [id number]         Accession #:    [id number] Date of Birth: [birth year]         Patient Gender: F Patient Age:   1 years Exam Location:  [facility] Procedure:      VAS [place] LOWER EXTREMITY VENOUS REFLUX Referring Phys: Eulogio Ditch --------------------------------------------------------------------------------  Indications: Swelling.  Performing Technologist: Almira Coaster RVS  Examination Guidelines: A complete evaluation includes B-mode imaging, spectral Doppler, color Doppler, and power Doppler as needed of all accessible portions of each vessel. Bilateral testing is considered an integral part of a complete examination. Limited examinations for reoccurring indications may be performed as noted. The reflux portion of the exam is performed with the patient in reverse Trendelenburg. Significant venous reflux is defined as >500 ms in the superficial venous system, and >1 second in the deep venous system.  Venous Reflux Times +--------------+---------+------+-----------+------------+--------+ RIGHT         Reflux NoRefluxReflux TimeDiameter cmsComments                         Yes                                  +--------------+---------+------+-----------+------------+--------+  CFV           no                                             +--------------+---------+------+-----------+------------+--------+ FV prox       no                                             +--------------+---------+------+-----------+------------+--------+ FV mid        no                                             +--------------+---------+------+-----------+------------+--------+ FV dist       no                                              +--------------+---------+------+-----------+------------+--------+ Popliteal     no                                             +--------------+---------+------+-----------+------------+--------+ GSV at SFJ    no                            .68              +--------------+---------+------+-----------+------------+--------+ GSV prox thigh          yes    557 ms       .55              +--------------+---------+------+-----------+------------+--------+ GSV mid thigh no                            .47              +--------------+---------+------+-----------+------------+--------+ GSV dist thighno                            .49              +--------------+---------+------+-----------+------------+--------+ GSV at knee   no                            .48              +--------------+---------+------+-----------+------------+--------+ GSV prox calf no                            .42              +--------------+---------+------+-----------+------------+--------+ SSV Pop Fossa no                            .39              +--------------+---------+------+-----------+------------+--------+  +--------------+---------+------+-----------+------------+--------+ LEFT  Reflux NoRefluxReflux TimeDiameter cmsComments                         Yes                                  +--------------+---------+------+-----------+------------+--------+ CFV           no                                             +--------------+---------+------+-----------+------------+--------+ FV prox       no                                             +--------------+---------+------+-----------+------------+--------+ FV mid        no                                             +--------------+---------+------+-----------+------------+--------+ FV dist       no                                              +--------------+---------+------+-----------+------------+--------+ Popliteal     no                                             +--------------+---------+------+-----------+------------+--------+ GSV at SFJ    no                            .64              +--------------+---------+------+-----------+------------+--------+ GSV prox thighno                            .78              +--------------+---------+------+-----------+------------+--------+ GSV mid thigh no                            .55              +--------------+---------+------+-----------+------------+--------+ GSV dist thighno                            .53              +--------------+---------+------+-----------+------------+--------+ GSV at knee   no                            .49              +--------------+---------+------+-----------+------------+--------+ GSV prox calf no                            .41              +--------------+---------+------+-----------+------------+--------+  SSV Pop Fossa no                            .32              +--------------+---------+------+-----------+------------+--------+   Summary: Bilateral: - No evidence of deep vein thrombosis seen in the lower extremities, bilaterally, from the common femoral through the popliteal veins. - No evidence of superficial venous thrombosis in the lower extremities, bilaterally. - No evidence of deep venous insufficiency seen bilaterally in the lower extremity. - No evidence of superficial venous reflux seen in the short saphenous veins bilaterally.  Right: - Venous reflux is noted in the right greater saphenous vein in the thigh.  Left: - No evidence of superficial venous reflux seen in the left greater saphenous vein.  *See table(s) above for measurements and observations. Electronically signed by Leotis Pain MD on 07/02/2022 at 1:15:05 PM.    Final     Assessment/Plan  Essential hypertension blood pressure  control important in reducing the progression of atherosclerotic disease. On appropriate oral medications.   Venous (peripheral) insufficiency She recently had a venous duplex which shows minimal reflux in the proximal right great saphenous vein without DVT or superficial thrombophlebitis.  Despite this, her symptoms are worsening.  I think this is largely lymphedema and have to try to get a lymphedema pump and going to make referral to I can perform a laser ablation of the right great saphenous vein, but honestly I would not expect this to make a dramatic improvement.  Pain in limb She recently had a venous duplex which shows minimal reflux in the proximal right great saphenous vein without DVT or superficial thrombophlebitis.  Despite this, her symptoms are worsening.  I think this is largely lymphedema and have to try to get a lymphedema pump and going to make referral to the lymphedema clinic.  I can perform a laser ablation of the right great saphenous vein, but honestly I would not expect this to make a dramatic improvement.  Lymphedema The patient has stage II-III lymphedema with chronic scarring and lymphatic channels.  Her symptoms are progressing despite the appropriate use of compression socks, elevation, and activity.  She has skin changes and thickening and appears like she has early cellulitis today.  I prescribed her some antibiotics for the cellulitis.  Referral to the lymphedema clinic.  A lymphedema pump would certainly be of benefit as well.  Cellulitis and abscess of leg, except foot Rx for Keflex    Leotis Pain, MD  07/03/2022 11:12 AM    This note was created with Dragon medical transcription system.  Any errors from dictation are purely unintentional

## 2022-07-03 NOTE — Assessment & Plan Note (Signed)
The patient has stage II-III lymphedema with chronic scarring and lymphatic channels.  Her symptoms are progressing despite the appropriate use of compression socks, elevation, and activity.  She has skin changes and thickening and appears like she has early cellulitis today.  I prescribed her some antibiotics for the cellulitis.  Referral to the lymphedema clinic.  A lymphedema pump would certainly be of benefit as well.

## 2022-07-03 NOTE — Assessment & Plan Note (Signed)
Rx for Keflex

## 2022-07-27 ENCOUNTER — Telehealth (INDEPENDENT_AMBULATORY_CARE_PROVIDER_SITE_OTHER): Payer: Self-pay | Admitting: Vascular Surgery

## 2022-07-27 NOTE — Telephone Encounter (Signed)
Patient wants to come in for both legs swollen from knees down.  They went down but is swollen again and left arm swollen real bad.  Please advise

## 2022-07-28 NOTE — Telephone Encounter (Signed)
Bring her in for a venous duplex of her upper extremity.  As far as her legs, she does have known swelling, so it is not uncommon for the swelling to come and go, especially if she is not wearing compression daily

## 2022-07-29 ENCOUNTER — Other Ambulatory Visit (INDEPENDENT_AMBULATORY_CARE_PROVIDER_SITE_OTHER): Payer: Self-pay | Admitting: Nurse Practitioner

## 2022-07-29 DIAGNOSIS — M7989 Other specified soft tissue disorders: Secondary | ICD-10-CM

## 2022-08-05 ENCOUNTER — Encounter (INDEPENDENT_AMBULATORY_CARE_PROVIDER_SITE_OTHER): Payer: Self-pay | Admitting: Nurse Practitioner

## 2022-08-05 ENCOUNTER — Ambulatory Visit (INDEPENDENT_AMBULATORY_CARE_PROVIDER_SITE_OTHER): Payer: 59 | Admitting: Nurse Practitioner

## 2022-08-05 ENCOUNTER — Ambulatory Visit (INDEPENDENT_AMBULATORY_CARE_PROVIDER_SITE_OTHER): Payer: 59

## 2022-08-05 VITALS — BP 90/61 | HR 76 | Resp 16

## 2022-08-05 DIAGNOSIS — M7989 Other specified soft tissue disorders: Secondary | ICD-10-CM

## 2022-08-05 DIAGNOSIS — I890 Lymphedema, not elsewhere classified: Secondary | ICD-10-CM

## 2022-08-05 DIAGNOSIS — I10 Essential (primary) hypertension: Secondary | ICD-10-CM

## 2022-08-18 ENCOUNTER — Encounter (INDEPENDENT_AMBULATORY_CARE_PROVIDER_SITE_OTHER): Payer: Self-pay | Admitting: Nurse Practitioner

## 2022-08-23 NOTE — Progress Notes (Signed)
Subjective:    Patient ID: Bonnie Gomez, female    DOB: Jun 14, 1966, 57 y.o.   MRN: [id number] Chief Complaint  Patient presents with   Follow-up    Ultrasound follow up     Patient returns today in follow up of leg pain and swelling.  Her legs are more swollen today.  She currently has continued to wear compression stocks.  She also notes that she has been having some left upper extremity arm swelling but that seems to have resolved today.  Today noninvasive study showed no evidence of DVT or thrombophlebitis in the left upper extremity.  She recently had a venous duplex which shows minimal reflux in the proximal right great saphenous vein without DVT or superficial thrombophlebitis    Review of Systems  Cardiovascular:  Positive for leg swelling.       Arm swelling  All other systems reviewed and are negative.      Objective:   Physical Exam Vitals reviewed.  HENT:     Head: Normocephalic.  Cardiovascular:     Rate and Rhythm: Normal rate.     Pulses:          Radial pulses are 2+ on the right side and 2+ on the left side.  Pulmonary:     Effort: Pulmonary effort is normal.  Musculoskeletal:     Right lower leg: Edema present.     Left lower leg: Edema present.  Skin:    General: Skin is warm and dry.  Neurological:     Mental Status: She is alert and oriented to person, place, and time.  Psychiatric:        Mood and Affect: Mood normal.        Behavior: Behavior normal.        Thought Content: Thought content normal.        Judgment: Judgment normal.     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   Hiatal hernia    Hypertension    Insomnia    Overactive bladder    Pneumonia    2013   Thyroid disease     Vitamin D deficiency     Social History   Socioeconomic History   Marital status: Single    Spouse name: Not on file   Number of children: Not on file   Years of education: Not on file   Highest education level: Not on file  Occupational History   Not on file  Tobacco Use   Smoking status: Every Day    Packs/day: 1.00    Years: 35.00    Total pack years: 35.00    Types: Cigarettes    Passive exposure: Current   Smokeless tobacco: Never   Tobacco comments:    "not now "per pt  Vaping Use   Vaping Use: Some days  Substance and Sexual Activity   Alcohol use: No    Alcohol/week: 0.0 standard drinks of alcohol   Drug use: Yes    Comment: prescribed '50mg'$  fentanyl patch & '10mg'$  oxy   Sexual activity: Never  Other Topics Concern   Not on file  Social History Narrative   Not on file   Social Determinants of Health   Financial Resource Strain: Not on file  Food Insecurity: Not on file  Transportation Needs: Not on file  Physical Activity: Not on file  Stress: Not on file  Social Connections: Not on file  Intimate Partner Violence: Not on file    Past Surgical History:  Procedure Laterality Date   ANGIOPLASTY  2008, 2012   [place]  2013   [place]  2006   [place]   UPPER GI ENDOSCOPY  2013   [place]    Family History  Problem Relation Age of Onset   Cancer Mother        lung   Cancer Father        lung   Cancer Maternal Grandmother        breast   Breast cancer Maternal Grandmother    Scoliosis Brother    Arthritis Brother    Uterine cancer Paternal Grandmother    Breast cancer Cousin    Bladder Cancer Neg Hx    Kidney cancer Neg Hx     Allergies  Allergen Reactions   Duloxetine Other (See Comments)    Agitation   Varenicline Other (See Comments)    Agitation   Duloxetine Hcl Anxiety    Agitation       Latest Ref Rng & Units 04/25/2021    3:39 AM 01/13/2021    6:13  PM 07/15/2017    3:09 PM  CBC  WBC 4.0 - 10.5 K/uL 10.1  7.9  7.4   Hemoglobin 12.0 - 15.0 g/dL 14.8  14.8  15.5   Hematocrit 36.0 - 46.0 % 42.2  44.0  44.7   Platelets 150 - 400 K/uL 225  221  212       CMP     Component Value Date/Time   NA 132 (L) 04/25/2021 0339   NA 142 06/17/2017 1513   NA 138 10/18/2012 1345   K 3.2 (L) 04/25/2021 0339   K 3.7 10/18/2012 1345   CL 94 (L) 04/25/2021 0339   CL 104 10/18/2012 1345   CO2 28 04/25/2021 0339   CO2 28 10/18/2012 1345   GLUCOSE 105 (H) 04/25/2021 0339   GLUCOSE 179 (H) 10/18/2012 1345   BUN 8 04/25/2021 0339   BUN 5 (L) 06/17/2017 1513   BUN 10 10/18/2012 1345   CREATININE 0.88 04/25/2021 0339   CREATININE 0.91 10/18/2012 1345   CALCIUM 8.3 (L) 04/25/2021 0339   CALCIUM 8.9 10/18/2012 1345   PROT 6.8 01/13/2021 1813   PROT 6.1 06/17/2017 1513   PROT 7.2 12/11/2011 1352   ALBUMIN 3.6 01/13/2021 1813   ALBUMIN 3.9 06/17/2017 1513   ALBUMIN 3.1 (L) 12/11/2011 1352   AST 20 01/13/2021 1813   AST 20 12/11/2011 1352   ALT 12 01/13/2021 1813   ALT 14 12/11/2011 1352   ALKPHOS 80 01/13/2021 1813   ALKPHOS 75 12/11/2011 1352   BILITOT 0.6 01/13/2021 1813   BILITOT 0.3 06/17/2017 1513   BILITOT 0.6 12/11/2011 1352   GFRNONAA >60 04/25/2021 0339   GFRNONAA >60 10/18/2012 1345   GFRAA >60 07/15/2017 1509   GFRAA >60 10/18/2012 1345     No results found.     Assessment & Plan:   1. Lymphedema At the previous office visit we are going to work on continuing to try to facilitate the patient getting  a lymphedema pump.  This will help her swelling greatly at relief.  Will plan on having her return in 6 months.  2. Swelling of limb Swelling has largely resolved.  I suspect this may be related to some sort of arthritis component.  No further intervention at this time indicated.  3. Essential hypertension Continue antihypertensive medications as already ordered, these medications have been reviewed and there are no changes  at this time.   Current Outpatient Medications on File Prior to Visit  Medication Sig Dispense Refill   albuterol (PROVENTIL) (2.5 MG/3ML) 0.083% nebulizer solution INHALE 1 VIAL USING NEBULIZER EVERY SIX HOURS AS NEEDED     aspirin EC 81 MG tablet Take 81 mg by mouth every morning.      BD INTEGRA SYRINGE 25G X 1" 3 ML MISC USE WITH B12  5   butalbital-acetaminophen-caffeine (FIORICET) 50-325-40 MG tablet Take 1 tablet by mouth every 4 (four) hours as needed for headache.     cephALEXin (KEFLEX) 500 MG capsule Take 1 capsule (500 mg total) by mouth 3 (three) times daily. 21 capsule 0   Cholecalciferol (VITAMIN D3) 5000 units CAPS Take 10,000 Units by mouth every morning.     clonazePAM (KLONOPIN) 0.5 MG tablet Take 0.5 mg by mouth 2 (two) times daily as needed.     colestipol (COLESTID) 1 g tablet      DALIRESP 500 MCG TABS tablet Take 500 mcg by mouth every evening.   2   dexlansoprazole (DEXILANT) 60 MG capsule Take 1 capsule by mouth daily as needed.     DULoxetine (CYMBALTA) 60 MG capsule      estradiol (ESTRACE) 0.5 MG tablet Take 1 tablet (0.5 mg total) by mouth daily. PATIENT WILL NEED AN APPOINTMENT BEFORE ANY FURTHER REFILLS GIVEN 30 tablet 0   fentaNYL (DURAGESIC - DOSED MCG/HR) 50 MCG/HR 1 patch every other day.  0   fentaNYL (DURAGESIC) 12 MCG/HR Place 1 patch onto the skin every other day.     FLUoxetine (PROZAC) 40 MG capsule      Fluticasone Furoate (ARNUITY ELLIPTA) 100 MCG/ACT AEPB Inhale into the lungs.     furosemide (LASIX) 40 MG tablet Take 40 mg by mouth daily as needed for fluid or edema.  5   gabapentin (NEURONTIN) 100 MG capsule Take by mouth.     ketoconazole (NIZORAL) 2 % cream APPLY A SMALL AMOUNT TOPICALLY TWO TIMES A DAY  2   medroxyPROGESTERone (PROVERA) 2.5 MG tablet Take 1 tablet (2.5 mg total) by mouth daily. 30 tablet 2   mirabegron ER (MYRBETRIQ) 50 MG TB24 tablet Take 1 tablet (50 mg total) by mouth daily. 90 tablet 3   mometasone (ELOCON) 0.1 % cream  Apply 1 application topically 2 (two) times daily.     mometasone (NASONEX) 50 MCG/ACT nasal spray Place into the nose.     nystatin (MYCOSTATIN/NYSTOP) powder APPLY SMALL AMOUNT TOPICALLY TWO TIMES A DAY AS NEEDED  2   oxyCODONE-acetaminophen (PERCOCET) 10-325 MG tablet Take 1 tablet by mouth every 4 (four) hours as needed for pain.     pantoprazole (PROTONIX) 40 MG tablet Take 40 mg by mouth daily.  2   rizatriptan (MAXALT-MLT) 10 MG disintegrating tablet Take by mouth.     rosuvastatin (CRESTOR) 20 MG tablet SMARTSIG:1 Tablet(s) By Mouth Every Evening     STIOLTO RESPIMAT 2.5-2.5 MCG/ACT AERS SMARTSIG:2 Puff(s) Via Inhaler Daily     terbinafine (LAMISIL) 250 MG tablet Take 250 mg by mouth daily.  tiZANidine (ZANAFLEX) 4 MG tablet Take 4 mg by mouth 3 (three) times daily as needed.     tolterodine (DETROL LA) 4 MG 24 hr capsule Take 1 capsule (4 mg total) by mouth daily. 90 capsule 3   valACYclovir (VALTREX) 500 MG tablet Take 500 mg by mouth daily.  4   No current facility-administered medications on file prior to visit.    There are no Patient Instructions on file for this visit. No follow-ups on file.   Kris Hartmann, NP

## 2022-10-06 ENCOUNTER — Ambulatory Visit (INDEPENDENT_AMBULATORY_CARE_PROVIDER_SITE_OTHER): Payer: Medicare Other | Admitting: Vascular Surgery

## 2022-11-03 ENCOUNTER — Ambulatory Visit (INDEPENDENT_AMBULATORY_CARE_PROVIDER_SITE_OTHER): Payer: 59 | Admitting: Vascular Surgery

## 2022-11-03 ENCOUNTER — Encounter (INDEPENDENT_AMBULATORY_CARE_PROVIDER_SITE_OTHER): Payer: Self-pay | Admitting: Vascular Surgery

## 2022-11-03 VITALS — BP 149/90 | HR 77 | Resp 16 | Wt 195.2 lb

## 2022-11-03 DIAGNOSIS — I10 Essential (primary) hypertension: Secondary | ICD-10-CM | POA: Diagnosis not present

## 2022-11-03 DIAGNOSIS — I872 Venous insufficiency (chronic) (peripheral): Secondary | ICD-10-CM | POA: Diagnosis not present

## 2022-11-03 DIAGNOSIS — I890 Lymphedema, not elsewhere classified: Secondary | ICD-10-CM | POA: Diagnosis not present

## 2022-11-03 NOTE — Assessment & Plan Note (Signed)
Venous reflux study showed mild disease that did not think this would benefit tremendously from intervention.  Doing better with conservative therapies.

## 2022-11-03 NOTE — Assessment & Plan Note (Signed)
Stage II lymphedema with hyperpigmentation as well refractory to compression and elevation.  It is better today, but still far from normal.  Continue conservative therapies.  Return in 6 months.

## 2022-11-03 NOTE — Assessment & Plan Note (Signed)
blood pressure control important in reducing the progression of atherosclerotic disease. On appropriate oral medications.  

## 2022-11-03 NOTE — Progress Notes (Signed)
MRN : [id number]  Bonnie Gomez is a 57 y.o. (1965-12-13) female who presents with chief complaint of  Chief Complaint  Patient presents with   Follow-up    3 month follow up  .  History of Present Illness: Patient returns today in follow up of leg swelling and pain.  Recently, she has been having neuropathic pain down her left arm associated with some tingling and swelling in her hands.  She denies any open wounds or infection.  She does have a bulging disc in her neck that she has known about previously. As for her legs, she denies any new ulceration or infection.  Her swelling is under better control now that she is not caring for her aunt anymore and is able to elevate her legs.  She wears her compression socks daily.  She still has a fair bit of pain, but the swelling is noticeably better.  Current Outpatient Medications  Medication Sig Dispense Refill   albuterol (PROVENTIL) (2.5 MG/3ML) 0.083% nebulizer solution INHALE 1 VIAL USING NEBULIZER EVERY SIX HOURS AS NEEDED     aspirin EC 81 MG tablet Take 81 mg by mouth every morning.      butalbital-acetaminophen-caffeine (FIORICET) 50-325-40 MG tablet Take 1 tablet by mouth every 4 (four) hours as needed for headache.     cephALEXin (KEFLEX) 500 MG capsule Take 1 capsule (500 mg total) by mouth 3 (three) times daily. 21 capsule 0   Cholecalciferol (VITAMIN D3) 5000 units CAPS Take 10,000 Units by mouth every morning.     clonazePAM (KLONOPIN) 0.5 MG tablet Take 0.5 mg by mouth 2 (two) times daily as needed.     colestipol (COLESTID) 1 g tablet      DALIRESP 500 MCG TABS tablet Take 500 mcg by mouth every evening.   2   dexlansoprazole (DEXILANT) 60 MG capsule Take 1 capsule by mouth daily as needed.     DULoxetine (CYMBALTA) 60 MG capsule      estradiol (ESTRACE) 0.5 MG tablet Take 1 tablet (0.5 mg total) by mouth daily. PATIENT WILL NEED AN APPOINTMENT BEFORE ANY FURTHER REFILLS GIVEN 30 tablet 0   fentaNYL (DURAGESIC - DOSED  MCG/HR) 50 MCG/HR 1 patch every other day.  0   fentaNYL (DURAGESIC) 12 MCG/HR Place 1 patch onto the skin every other day.     FLUoxetine (PROZAC) 40 MG capsule      Fluticasone Furoate (ARNUITY ELLIPTA) 100 MCG/ACT AEPB Inhale into the lungs.     furosemide (LASIX) 40 MG tablet Take 40 mg by mouth daily as needed for fluid or edema.  5   gabapentin (NEURONTIN) 100 MG capsule Take by mouth.     ketoconazole (NIZORAL) 2 % cream APPLY A SMALL AMOUNT TOPICALLY TWO TIMES A DAY  2   medroxyPROGESTERone (PROVERA) 2.5 MG tablet Take 1 tablet (2.5 mg total) by mouth daily. 30 tablet 2   mirabegron ER (MYRBETRIQ) 50 MG TB24 tablet Take 1 tablet (50 mg total) by mouth daily. 90 tablet 3   mometasone (ELOCON) 0.1 % cream Apply 1 application topically 2 (two) times daily.     mometasone (NASONEX) 50 MCG/ACT nasal spray Place into the nose.     nystatin (MYCOSTATIN/NYSTOP) powder APPLY SMALL AMOUNT TOPICALLY TWO TIMES A DAY AS NEEDED  2   oxyCODONE-acetaminophen (PERCOCET) 10-325 MG tablet Take 1 tablet by mouth every 4 (four) hours as needed for pain.     pantoprazole (PROTONIX) 40 MG tablet Take 40 mg by  mouth daily.  2   rizatriptan (MAXALT-MLT) 10 MG disintegrating tablet Take by mouth.     rosuvastatin (CRESTOR) 20 MG tablet SMARTSIG:1 Tablet(s) By Mouth Every Evening     STIOLTO RESPIMAT 2.5-2.5 MCG/ACT AERS SMARTSIG:2 Puff(s) Via Inhaler Daily     terbinafine (LAMISIL) 250 MG tablet Take 250 mg by mouth daily.     tiZANidine (ZANAFLEX) 4 MG tablet Take 4 mg by mouth 3 (three) times daily as needed.     tolterodine (DETROL LA) 4 MG 24 hr capsule Take 1 capsule (4 mg total) by mouth daily. 90 capsule 3   valACYclovir (VALTREX) 500 MG tablet Take 500 mg by mouth daily.  4   BD INTEGRA SYRINGE 25G X 1" 3 ML MISC USE WITH B12  5   No current facility-administered medications for this visit.    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   Hiatal hernia    Hypertension    Insomnia    Overactive bladder    Pneumonia    2013   Thyroid disease    Vitamin D deficiency     Past Surgical History:  Procedure Laterality Date   ANGIOPLASTY  2008, 2012   CHOLECYSTECTOMY  1985   COLONOSCOPY  2013   GYNECOLOGIC CRYOSURGERY  1993   SKIN BIOPSY  2006   TUBAL LIGATION  1992   UPPER GI ENDOSCOPY  2013   [place]     Social History   Tobacco Use   Smoking status: Every Day    Packs/day: 1.00    Years: 35.00    Additional pack years: 0.00    Total pack years: 35.00    Types: Cigarettes    Passive exposure: Current   Smokeless tobacco: Never   Tobacco comments:    "not now "per pt  Vaping Use   Vaping Use: Some days  Substance Use Topics   Alcohol use: No    Alcohol/week: 0.0 standard drinks of alcohol   Drug use: Yes    Comment: prescribed  fentanyl patch &  oxy      Family History  Problem Relation Age of Onset   Cancer Mother        lung   Cancer Father        lung   Cancer Maternal Grandmother        breast   Breast cancer Maternal Grandmother    Scoliosis Brother    Arthritis Brother    Uterine cancer Paternal Grandmother    Breast cancer Cousin    Bladder Cancer Neg Hx    Kidney cancer Neg Hx      Allergies  Allergen Reactions   Duloxetine Other (See Comments)    Agitation   Varenicline Other (See Comments)    Agitation   Duloxetine Hcl Anxiety    Agitation     REVIEW OF SYSTEMS (Negative unless checked)   Constitutional: Weight loss  Fever  Chills Cardiac: Chest pain   Chest pressure   Palpitations   Shortness of breath when laying flat   Shortness of breath at rest   Shortness of breath with exertion. Vascular:  Pain in legs with walking   Pain in legs at rest   Pain in legs  when laying flat   [] Claudication   [] Pain in feet when  walking  [] Pain in feet at rest  [] Pain in feet when laying flat   [] History of DVT   [] Phlebitis   [] Swelling in legs   [] Varicose veins   [] Non-healing ulcers Pulmonary:   [] Uses home oxygen   [] Productive cough   [] Hemoptysis   [] Wheeze  [x] COPD   [x] Asthma Neurologic:  [] Dizziness  [] Blackouts   [] Seizures   [] History of stroke   [] History of TIA  [] Aphasia   [] Temporary blindness   [] Dysphagia   [] Weakness or numbness in arms   [] Weakness or numbness in legs Musculoskeletal:  [x] Arthritis   [] Joint swelling   [] Joint pain   [x] Low back pain Hematologic:  [] Easy bruising  [] Easy bleeding   [] Hypercoagulable state   [] Anemic  [] Hepatitis Gastrointestinal:  [] Blood in stool   [] Vomiting blood  [x] Gastroesophageal reflux/heartburn   [] Abdominal pain Genitourinary:  [] Chronic kidney disease   [] Difficult urination  [] Frequent urination  [] Burning with urination   [] Hematuria Skin:  [] Rashes   [] Ulcers   [] Wounds Psychological:  [x] History of anxiety   []  History of major depression.  Physical Examination  BP (!) 149/90 (BP Location: Right Arm)   Pulse 77   Resp 16   Wt 195 lb 3.2 oz (88.5 kg)   BMI 34.58 kg/m  Gen:  WD/WN, NAD Head: [place]/AT, No temporalis wasting. Ear/Nose/Throat: Hearing grossly intact, nares w/o erythema or drainage Eyes: Conjunctiva clear. Sclera non-icteric Neck: Supple.  Trachea midline Pulmonary:  Good air movement, no use of accessory muscles.  Cardiac: RRR, no JVD Vascular:  Vessel Right Left  Radial Palpable Palpable                          PT Palpable Palpable  DP Palpable Palpable   Gastrointestinal: soft, non-tender/non-distended. No guarding/reflex.  Musculoskeletal: M/S 5/5 throughout.  No deformity or atrophy. 1+  BLE edema. Neurologic: Sensation grossly intact in extremities.  Symmetrical.  Speech is fluent.  Psychiatric: Judgment intact, Mood & affect appropriate for pt's clinical situation. Dermatologic: No rashes or ulcers noted.  No  cellulitis or open wounds.      Labs No results found for this or any previous visit (from the past 2160 hour(s)).  Radiology No results found.  Assessment/Plan  Lymphedema Stage II lymphedema with hyperpigmentation as well refractory to compression and elevation.  It is better today, but still far from normal.  Continue conservative therapies.  Return in 6 months.  Venous (peripheral) insufficiency Venous reflux study showed mild disease that did not think this would benefit tremendously from intervention.  Doing better with conservative therapies.  Essential hypertension blood pressure control important in reducing the progression of atherosclerotic disease. On appropriate oral medications.    Festus Barren, MD  11/03/2022 12:25 PM    This note was created with Dragon medical transcription system.  Any errors from dictation are purely unintentional

## 2023-02-15 ENCOUNTER — Ambulatory Visit (INDEPENDENT_AMBULATORY_CARE_PROVIDER_SITE_OTHER): Payer: 59 | Admitting: Urology

## 2023-02-15 ENCOUNTER — Encounter: Payer: Self-pay | Admitting: Urology

## 2023-02-15 VITALS — BP 137/84 | HR 96 | Wt 185.0 lb

## 2023-02-15 DIAGNOSIS — N3946 Mixed incontinence: Secondary | ICD-10-CM | POA: Diagnosis not present

## 2023-02-15 LAB — URINALYSIS, COMPLETE
Bilirubin, UA: NEGATIVE
Glucose, UA: NEGATIVE
Ketones, UA: NEGATIVE
Leukocytes,UA: NEGATIVE
Nitrite, UA: NEGATIVE
RBC, UA: NEGATIVE
Specific Gravity, UA: >1.03 — ABNORMAL HIGH (ref 1.005–1.030)
Urobilinogen, Ur: 0.2 mg/dL (ref 0.2–1.0)
pH, UA: 5 (ref 5.0–7.5)

## 2023-02-15 LAB — MICROSCOPIC EXAMINATION

## 2023-02-15 MED ORDER — TOLTERODINE TARTRATE ER 4 MG PO CP24: 4.0000 mg | ORAL_CAPSULE | Freq: Every day | ORAL | 3 refills | Status: DC

## 2023-02-15 MED ORDER — MIRABEGRON ER 50 MG PO TB24: 50.0000 mg | ORAL_TABLET | Freq: Every day | ORAL | 3 refills | Status: DC

## 2023-02-15 NOTE — Progress Notes (Signed)
02/15/2023 1:45 PM   Bonnie Gomez 09/06/1965 [id number]  Referring provider: Armando Gang, FNP [address] [place],  [place] [address]  Chief Complaint  Patient presents with   Urinary Incontinence    HPI: Reviewed old chart and added the following Today Incontinence much better.  Still does some bathroom mapping.  Still leaks but much better on the combination.  Insurance would not cover Toviaz.  They will cover Myrbetriq and Myrbetriq prescription renewed.  2 months 8 mg Apples given.  She can come by and get samples when she wants.  See me in a year.  Clinically not infected today       Urge incontinence dramatically improved on Toviaz and Myrbetriq.  90x3 Myrbetriq sent.  She comes in and gets Toviaz samples.  Clinically not infected.  Frequency stable.   Today Incontinence dramatically better on a combination of Detrol and Myrbetriq. See in one year   Today Incontinence improved.  She leaks a little bit when she gets out of a chair associated with urgency.  Overall much better but still leaking some.  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   Hiatal hernia    Hypertension    Insomnia    Overactive bladder    Pneumonia    2013   Thyroid disease    Vitamin D deficiency     Surgical History: Past Surgical History:  Procedure Laterality Date   ANGIOPLASTY  2008, 2012   CHOLECYSTECTOMY  1985   COLONOSCOPY  2013   GYNECOLOGIC CRYOSURGERY  1993   SKIN BIOPSY  2006   TUBAL LIGATION  1992   UPPER GI ENDOSCOPY  2013   [facility]itt    Home Medications:  Allergies as of 02/15/2023       Reactions   Duloxetine Other (See Comments)   Agitation   Varenicline Other (See Comments)   Agitation   Duloxetine Hcl Anxiety    Agitation        Medication List        Accurate as of February 15, 2023  1:45 PM. If you have any questions, ask your nurse or doctor.          albuterol (2.5 MG/3ML) 0.083% nebulizer solution Commonly known as: PROVENTIL INHALE 1 VIAL USING NEBULIZER EVERY SIX HOURS AS NEEDED   Arnuity Ellipta 100 MCG/ACT Aepb Generic drug: Fluticasone Furoate Inhale into the lungs.   aspirin EC 81 MG tablet Take 81 mg by mouth every morning.   BD Integra Syringe 25G X 1" 3 ML Misc Generic drug: SYRINGE-NEEDLE (DISP) 3 ML USE WITH B12   butalbital-acetaminophen-caffeine 50-325-40 MG tablet Commonly known as: FIORICET Take 1 tablet by mouth every 4 (four) hours as needed for headache.   cephALEXin 500 MG capsule Commonly known as: KEFLEX Take 1 capsule (500 mg total) by mouth 3 (three) times daily.   clonazePAM 0.5 MG tablet Commonly known as: KLONOPIN Take 0.5 mg by mouth 2 (two) times daily as needed.   colestipol 1 g tablet Commonly known as: COLESTID   Daliresp 500 MCG Tabs tablet Generic drug: roflumilast Take 500 mcg by mouth every evening.   dexlansoprazole 60 MG capsule Commonly known  as: DEXILANT Take 1 capsule by mouth daily as needed.   DULoxetine 60 MG capsule Commonly known as: CYMBALTA   estradiol 0.5 MG tablet Commonly known as: ESTRACE Take 1 tablet (0.5 mg total) by mouth daily. PATIENT WILL NEED AN APPOINTMENT BEFORE ANY FURTHER REFILLS GIVEN   fentaNYL 50 MCG/HR Commonly known as: DURAGESIC 1 patch every other day.   fentaNYL 12 MCG/HR Commonly known as: DURAGESIC Place 1 patch onto the skin every other day.   FLUoxetine 40 MG capsule Commonly known as: PROZAC   furosemide 40 MG tablet Commonly known as: LASIX Take 40 mg by mouth daily as needed for fluid or edema.   gabapentin 100 MG capsule Commonly known as: NEURONTIN Take by mouth.   ketoconazole 2 % cream Commonly known as: NIZORAL APPLY A SMALL AMOUNT TOPICALLY TWO TIMES A DAY    medroxyPROGESTERone 2.5 MG tablet Commonly known as: PROVERA Take 1 tablet (2.5 mg total) by mouth daily.   mirabegron ER 50 MG Tb24 tablet Commonly known as: Myrbetriq Take 1 tablet (50 mg total) by mouth daily.   mometasone 0.1 % cream Commonly known as: ELOCON Apply 1 application topically 2 (two) times daily.   mometasone 50 MCG/ACT nasal spray Commonly known as: NASONEX Place into the nose.   nystatin powder Commonly known as: MYCOSTATIN/NYSTOP APPLY SMALL AMOUNT TOPICALLY TWO TIMES A DAY AS NEEDED   oxyCODONE-acetaminophen 10-325 MG tablet Commonly known as: PERCOCET Take 1 tablet by mouth every 4 (four) hours as needed for pain.   pantoprazole 40 MG tablet Commonly known as: PROTONIX Take 40 mg by mouth daily.   rizatriptan 10 MG disintegrating tablet Commonly known as: MAXALT-MLT Take by mouth.   rosuvastatin 20 MG tablet Commonly known as: CRESTOR SMARTSIG:1 Tablet(s) By Mouth Every Evening   Stiolto Respimat 2.5-2.5 MCG/ACT Aers Generic drug: Tiotropium Bromide-Olodaterol SMARTSIG:2 Puff(s) Via Inhaler Daily   terbinafine 250 MG tablet Commonly known as: LAMISIL Take 250 mg by mouth daily.   tiZANidine 4 MG tablet Commonly known as: ZANAFLEX Take 4 mg by mouth 3 (three) times daily as needed.   tolterodine 4 MG 24 hr capsule Commonly known as: DETROL LA Take 1 capsule (4 mg total) by mouth daily.   valACYclovir 500 MG tablet Commonly known as: VALTREX Take 500 mg by mouth daily.   Vitamin D3 125 MCG (5000 UT) Caps Take 10,000 Units by mouth every morning.        Allergies:  Allergies  Allergen Reactions   Duloxetine Other (See Comments)    Agitation   Varenicline Other (See Comments)    Agitation   Duloxetine Hcl Anxiety    Agitation    Family History: Family History  Problem Relation Age of Onset   Cancer Mother        lung   Cancer Father        lung   Cancer Maternal Grandmother        breast   Breast cancer Maternal  Grandmother    Scoliosis Brother    Arthritis Brother    Uterine cancer Paternal Grandmother    Breast cancer Cousin    Bladder Cancer Neg Hx    Kidney cancer Neg Hx     Social History:  reports that she has been smoking cigarettes. She has a 35 pack-year smoking history. She has been exposed to tobacco smoke. She has never used smokeless tobacco. She reports current drug use. She reports that she does not drink alcohol.  ROS:  Physical Exam: There were no vitals taken for this visit.  Constitutional:  Alert and oriented, No acute distress. HEENT: [place] AT, moist mucus membranes.  Trachea midline, no masses.  Laboratory Data: Lab Results  Component Value Date   WBC 10.1 04/25/2021   HGB 14.8 04/25/2021   HCT 42.2 04/25/2021   MCV 93.6 04/25/2021   PLT 225 04/25/2021    Lab Results  Component Value Date   CREATININE 0.88 04/25/2021    No results found for: "PSA"  Lab Results  Component Value Date   TESTOSTERONE 19 06/17/2017    No results found for: "HGBA1C"  Urinalysis    Component Value Date/Time   COLORURINE YELLOW (A) 10/01/2016 1540   APPEARANCEUR Clear 02/09/2022 1534   LABSPEC 1.008 10/01/2016 1540   LABSPEC 1.018 10/18/2012 1346   PHURINE 5.0 10/01/2016 1540   GLUCOSEU Negative 02/09/2022 1534   GLUCOSEU Negative 10/18/2012 1346   HGBUR NEGATIVE 10/01/2016 1540   BILIRUBINUR Negative 02/09/2022 1534   BILIRUBINUR Negative 10/18/2012 1346   KETONESUR NEGATIVE 10/01/2016 1540   PROTEINUR Negative 02/09/2022 1534   PROTEINUR NEGATIVE 10/01/2016 1540   NITRITE Negative 02/09/2022 1534   NITRITE NEGATIVE 10/01/2016 1540   LEUKOCYTESUR Negative 02/09/2022 1534   LEUKOCYTESUR Negative 10/18/2012 1346    Pertinent Imaging:   Assessment & Plan: Patient will stay on Detrol and Myrbetriq.  Both prescriptions renewed 90 x 3.  She would like to see physical therapy.  She was asking if paperwork  could be filled out for pads  1. Mixed incontinence  - Urinalysis, Complete   No follow-ups on file.  Martina Sinner, MD  [facility] [address] [place], [place] [address] [phone]

## 2023-03-08 ENCOUNTER — Ambulatory Visit: Payer: 59

## 2023-05-05 ENCOUNTER — Encounter (INDEPENDENT_AMBULATORY_CARE_PROVIDER_SITE_OTHER): Payer: Self-pay | Admitting: Nurse Practitioner

## 2023-05-05 ENCOUNTER — Ambulatory Visit (INDEPENDENT_AMBULATORY_CARE_PROVIDER_SITE_OTHER): Payer: 59 | Admitting: Nurse Practitioner

## 2023-05-05 VITALS — BP 172/92 | HR 81 | Resp 16 | Wt 187.2 lb

## 2023-05-05 DIAGNOSIS — I10 Essential (primary) hypertension: Secondary | ICD-10-CM

## 2023-05-05 DIAGNOSIS — I890 Lymphedema, not elsewhere classified: Secondary | ICD-10-CM | POA: Diagnosis not present

## 2023-05-06 ENCOUNTER — Encounter (INDEPENDENT_AMBULATORY_CARE_PROVIDER_SITE_OTHER): Payer: Self-pay | Admitting: Nurse Practitioner

## 2023-05-06 NOTE — Progress Notes (Signed)
Subjective:    Patient ID: Bonnie Gomez, female    DOB: June 07, 1966, 57 y.o.   MRN: [id number] Chief Complaint  Patient presents with   Follow-up    6 month follow up    The patient is a 57 year old female who returns today for evaluation of her right lower extremity edema.  The patient notes that she utilizes medical grade compression daily.  She also uses a diuretic as needed as given to her by her primary care provider.  She also elevates is much as possible.  Given that the patient is currently exercise is recurrent lower extremity edema is doing fairly well with no cellulitis or open wound or ulceration.  Currently the patient is maintained via medical there is hyperpigmentation bilaterally.    Review of Systems  Cardiovascular:  Positive for leg swelling.  All other systems reviewed and are negative.      Objective:   Physical Exam Vitals reviewed.  HENT:     Head: Normocephalic.  Cardiovascular:     Rate and Rhythm: Normal rate.  Pulmonary:     Effort: Pulmonary effort is normal.     Comments: Home o2 Musculoskeletal:     Right lower leg: Edema present.     Left lower leg: Edema present.  Neurological:     Mental Status: She is alert and oriented to person, place, and time.  Psychiatric:        Mood and Affect: Mood normal.        Behavior: Behavior normal.        Thought Content: Thought content normal.        Judgment: Judgment normal.     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   Hiatal hernia    Hypertension    Insomnia    Overactive bladder    Pneumonia    2013   Thyroid disease    Vitamin D deficiency     Social History    Socioeconomic History   Marital status: Single    Spouse name: Not on file   Number of children: Not on file   Years of education: Not on file   Highest education level: Not on file  Occupational History   Not on file  Tobacco Use   Smoking status: Every Day    Current packs/day: 1.00    Average packs/day: 1 pack/day for 35.0 years (35.0 ttl pk-yrs)    Types: Cigarettes    Passive exposure: Current   Smokeless tobacco: Never   Tobacco comments:    "not now "per pt  Vaping Use   Vaping status: Some Days  Substance and Sexual Activity   Alcohol use: No    Alcohol/week: 0.0 standard drinks of alcohol   Drug use: Yes    Comment: prescribed 50mg  fentanyl patch & 10mg  oxy   Sexual activity: Never  Other Topics Concern   Not on file  Social History Narrative   Not on file   Social Determinants of Health   Financial Resource Strain: Not on file  Food Insecurity: Not on file  Transportation Needs: Not on file  Physical Activity: Not on file  Stress: Not on file  Social Connections: Not on file  Intimate Partner Violence: Not on file    Past Surgical History:  Procedure Laterality Date   ANGIOPLASTY  2008, 2012   CHOLECYSTECTOMY  1985   COLONOSCOPY  2013   GYNECOLOGIC CRYOSURGERY  1993   SKIN BIOPSY  2006   TUBAL LIGATION  1992   UPPER GI ENDOSCOPY  2013   elloitt    Family History  Problem Relation Age of Onset   Cancer Mother        lung   Cancer Father        lung   Cancer Maternal Grandmother        breast   Breast cancer Maternal Grandmother    Scoliosis Brother    Arthritis Brother    Uterine cancer Paternal Grandmother    Breast cancer Cousin    Bladder Cancer Neg Hx    Kidney cancer Neg Hx     Allergies  Allergen Reactions   Duloxetine Other (See Comments)    Agitation   Varenicline Other (See Comments)    Agitation   Duloxetine Hcl Anxiety    Agitation       Latest Ref Rng & Units 04/25/2021    3:39 AM 01/13/2021    6:13 PM  07/15/2017    3:09 PM  CBC  WBC 4.0 - 10.5 K/uL 10.1  7.9  7.4   Hemoglobin 12.0 - 15.0 g/dL 16.1  09.6  04.5   Hematocrit 36.0 - 46.0 % 42.2  44.0  44.7   Platelets 150 - 400 K/uL 225  221  212       CMP     Component Value Date/Time   NA 132 (L) 04/25/2021 0339   NA 142 06/17/2017 1513   NA 138 10/18/2012 1345   K 3.2 (L) 04/25/2021 0339   K 3.7 10/18/2012 1345   CL 94 (L) 04/25/2021 0339   CL 104 10/18/2012 1345   CO2 28 04/25/2021 0339   CO2 28 10/18/2012 1345   GLUCOSE 105 (H) 04/25/2021 0339   GLUCOSE 179 (H) 10/18/2012 1345   BUN 8 04/25/2021 0339   BUN 5 (L) 06/17/2017 1513   BUN 10 10/18/2012 1345   CREATININE 0.88 04/25/2021 0339   CREATININE 0.91 10/18/2012 1345   CALCIUM 8.3 (L) 04/25/2021 0339   CALCIUM 8.9 10/18/2012 1345   PROT 6.8 01/13/2021 1813   PROT 6.1 06/17/2017 1513   PROT 7.2 12/11/2011 1352   ALBUMIN 3.6 01/13/2021 1813   ALBUMIN 3.9 06/17/2017 1513   ALBUMIN 3.1 (L) 12/11/2011 1352   AST 20 01/13/2021 1813   AST 20 12/11/2011 1352   ALT 12 01/13/2021 1813   ALT 14 12/11/2011 1352   ALKPHOS 80 01/13/2021 1813   ALKPHOS 75 12/11/2011 1352   BILITOT 0.6 01/13/2021 1813   BILITOT 0.3 06/17/2017 1513   BILITOT 0.6 12/11/2011 1352   GFRNONAA >60 04/25/2021 0339   GFRNONAA >60 10/18/2012 1345     No results found.     Assessment & Plan:   1. Lymphedema Recommend:  No surgery or intervention at this point in time.   The Patient is CEAP [id number].  The patient has been wearing compression for more than 12 weeks with no or little benefit.  The patient has been exercising daily for more than 12 weeks. The patient has been elevating and taking  OTC pain medications for more than 12 weeks.  None of these have have eliminated the pain related to the lymphedema or the discomfort regarding excessive swelling and venous congestion.    I have reviewed my discussion with the patient regarding lymphedema and why it  causes symptoms.  Patient will  continue wearing graduated compression on a daily basis. The patient should put the compression on first thing in the morning and removing them in the evening. The patient should not sleep in the compression.   In addition, behavioral modification throughout the day will be continued.  This will include frequent elevation (such as in a recliner), use of over the counter pain medications as needed and exercise such as walking.  The systemic causes for chronic edema such as liver, kidney and cardiac etiologies do not appear to have significant changed over the past year.    The patient has chronic , severe lymphedema with hyperpigmentation of the skin and has done MLD, skin care, medication, diet, exercise, elevation and compression for 4 weeks with no improvement,  I am recommending a lymphedema pump.  The patient still has stage 3 lymphedema and therefore, I believe that a lymph pump is needed to improve the control of the patient's lymphedema and improve the quality of life.  Additionally, a lymph pump is warranted because it will reduce the risk of cellulitis and ulceration in the future.  Patient should follow-up in six months   2. Essential hypertension Continue antihypertensive medications as already ordered, these medications have been reviewed and there are no changes at this time.   Current Outpatient Medications on File Prior to Visit  Medication Sig Dispense Refill   albuterol (PROVENTIL) (2.5 MG/3ML) 0.083% nebulizer solution INHALE 1 VIAL USING NEBULIZER EVERY SIX HOURS AS NEEDED     aspirin EC 81 MG tablet Take 81 mg by mouth every morning.      butalbital-acetaminophen-caffeine (FIORICET) 50-325-40 MG tablet Take 1 tablet by mouth every 4 (four) hours as needed for headache.     cephALEXin (KEFLEX) 500 MG capsule Take 1 capsule (500 mg total) by mouth 3 (three) times daily. 21 capsule 0   Cholecalciferol (VITAMIN D3) 5000 units CAPS Take 10,000 Units by mouth every morning.      clonazePAM (KLONOPIN) 0.5 MG tablet Take 0.5 mg by mouth 2 (two) times daily as needed.     colestipol (COLESTID) 1 g tablet      DALIRESP 500 MCG TABS tablet Take 500 mcg by mouth every evening.   2   dexlansoprazole (DEXILANT) 60 MG capsule Take 1 capsule by mouth daily as needed.     DULoxetine (CYMBALTA) 60 MG capsule      estradiol (ESTRACE) 0.5 MG tablet Take 1 tablet (0.5 mg total) by mouth daily. PATIENT WILL NEED AN APPOINTMENT BEFORE ANY FURTHER REFILLS GIVEN 30 tablet 0   fentaNYL (DURAGESIC - DOSED MCG/HR) 50 MCG/HR 1 patch every other day.  0   fentaNYL (DURAGESIC) 12 MCG/HR Place 1 patch onto the skin every other day. Pt taking 75 mcg     FLUoxetine (PROZAC) 40 MG capsule      Fluticasone Furoate (ARNUITY ELLIPTA) 100 MCG/ACT AEPB Inhale into the lungs.     furosemide (LASIX) 40 MG tablet Take 40 mg by mouth daily as needed for fluid or edema.  5   gabapentin (NEURONTIN) 100 MG capsule Take by mouth.     ketoconazole (NIZORAL) 2 % cream APPLY A SMALL AMOUNT TOPICALLY TWO TIMES A  DAY  2   medroxyPROGESTERone (PROVERA) 2.5 MG tablet Take 1 tablet (2.5 mg total) by mouth daily. 30 tablet 2   mirabegron ER (MYRBETRIQ) 50 MG TB24 tablet Take 1 tablet (50 mg total) by mouth daily. 90 tablet 3   mometasone (ELOCON) 0.1 % cream Apply 1 application topically 2 (two) times daily.     mometasone (NASONEX) 50 MCG/ACT nasal spray Place into the nose.     nystatin (MYCOSTATIN/NYSTOP) powder APPLY SMALL AMOUNT TOPICALLY TWO TIMES A DAY AS NEEDED  2   oxyCODONE-acetaminophen (PERCOCET) 10-325 MG tablet Take 1 tablet by mouth every 4 (four) hours as needed for pain.     pantoprazole (PROTONIX) 40 MG tablet Take 40 mg by mouth daily.  2   rizatriptan (MAXALT-MLT) 10 MG disintegrating tablet Take by mouth.     rosuvastatin (CRESTOR) 20 MG tablet SMARTSIG:1 Tablet(s) By Mouth Every Evening     STIOLTO RESPIMAT 2.5-2.5 MCG/ACT AERS SMARTSIG:2 Puff(s) Via Inhaler Daily     terbinafine (LAMISIL) 250 MG  tablet Take 250 mg by mouth daily.     tiZANidine (ZANAFLEX) 4 MG tablet Take 4 mg by mouth 3 (three) times daily as needed.     tolterodine (DETROL LA) 4 MG 24 hr capsule Take 1 capsule (4 mg total) by mouth daily. 90 capsule 3   valACYclovir (VALTREX) 500 MG tablet Take 500 mg by mouth daily.  4   BD INTEGRA SYRINGE 25G X 1" 3 ML MISC USE WITH B12  5   No current facility-administered medications on file prior to visit.    There are no Patient Instructions on file for this visit. No follow-ups on file.   Georgiana Spinner, NP

## 2023-06-22 ENCOUNTER — Ambulatory Visit: Payer: 59 | Admitting: Physical Therapy

## 2023-06-24 ENCOUNTER — Ambulatory Visit: Payer: 59 | Admitting: Physical Therapy

## 2023-06-25 ENCOUNTER — Ambulatory Visit: Payer: 59 | Attending: Urology | Admitting: Physical Therapy

## 2023-06-25 ENCOUNTER — Encounter: Payer: Self-pay | Admitting: Physical Therapy

## 2023-06-25 DIAGNOSIS — M533 Sacrococcygeal disorders, not elsewhere classified: Secondary | ICD-10-CM | POA: Diagnosis present

## 2023-06-25 DIAGNOSIS — R2689 Other abnormalities of gait and mobility: Secondary | ICD-10-CM | POA: Diagnosis present

## 2023-06-25 DIAGNOSIS — N3946 Mixed incontinence: Secondary | ICD-10-CM | POA: Diagnosis not present

## 2023-06-25 DIAGNOSIS — M4125 Other idiopathic scoliosis, thoracolumbar region: Secondary | ICD-10-CM | POA: Diagnosis present

## 2023-06-25 NOTE — Patient Instructions (Signed)
  Proper body mechanics with getting out of a chair to decrease strain  on back &pelvic floor   Avoid holding your breath when Getting out of the chair:  Scoot to front part of chair chair Heels behind knees, feet are hip width apart, nose over toes  Inhale like you are smelling roses Exhale to stand   __  Sitting with feet on the floor  ___  Minisquat: Scoot buttocks back slight, hinge like you are looking at your reflection on a pond  Knees behind toes,  Inhale to "smell flowers"  Exhale on the rise "like rocket"  Do not lock knees, have more weight across ballmounds of feet, toes relaxed and spread them, not grip them   10 reps x 3 x day    __  Wear shoe lift in toe box and heel in R feet.  Remove if causes more pain

## 2023-06-25 NOTE — Therapy (Addendum)
OUTPATIENT PHYSICAL THERAPY EVALUATION   Patient Name: Bonnie Gomez MRN: [id number] DOB:1965/12/23, 57 y.o., female Today's Date: 06/25/2023   PT End of Session - 06/25/23 4098     Visit Number 1    Number of Visits 10    Date for PT Re-Evaluation 09/03/23    PT Start Time 0812    PT Stop Time 0845    PT Time Calculation (min) 33 min    Activity Tolerance Patient tolerated treatment well;No increased pain    Behavior During Therapy [facility] for tasks assessed/performed             Past Medical History:  Diagnosis Date   Allergy    Anxiety    Arthritis    Asthma 2001   COPD (chronic obstructive pulmonary disease) (HCC) 2013   Depression    Emphysema lung (HCC)    Heart abnormality    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   Hiatal hernia    Hypertension    Insomnia    Overactive bladder    Pneumonia    2013   Thyroid disease    Vitamin D deficiency    Past Surgical History:  Procedure Laterality Date   ANGIOPLASTY  2008, 2012   CHOLECYSTECTOMY  1985   COLONOSCOPY  2013   GYNECOLOGIC CRYOSURGERY  1993   SKIN BIOPSY  2006   TUBAL LIGATION  1992   UPPER GI ENDOSCOPY  2013   [facility]   Patient Active Problem List   Diagnosis Date Noted   Venous (peripheral) insufficiency 07/03/2022   Cellulitis and abscess of leg, except foot 07/03/2022   Lymphedema 06/17/2021   Swelling of limb 03/04/2021   Essential hypertension 03/04/2021   Pain in limb 03/04/2021   Neck pain 11/17/2017   Post-menopausal 05/03/2017   COPD exacerbation (HCC) 02/29/2016   Condyloma acuminatum in female 04/29/2015   Perineal mass in female 04/11/2015   Anxiety 12/05/2013   Sleep apnea 12/05/2013   Migraine 12/05/2013    PCP: Clint Guy   REFERRING PROVIDER: MacDiarmid   REFERRING DIAG:  Mixed Incontinence  Rationale for Evaluation and Treatment Rehabilitation  THERAPY DIAG:  Other abnormalities of gait and mobility  Other idiopathic scoliosis, thoracolumbar region  Sacrococcygeal  disorders, not elsewhere classified  ONSET DATE:   SUBJECTIVE:                                                                                                                                                                                           SUBJECTIVE STATEMENT: 1) urinary leakage occurs coughing, sit to stand,  and also leakage occurs before making it to the bathroom.  Pt has to apologize to store owners when she leaked to the floor.  Wears  4 Depend diapers during the day and 2-5 x  night.   Nocturia 2-5 x night.  Leakage started after menopause   2)B radiating LBP ( mostly radiating on LLE) :  At worst 8/10.  Has to lean on dishwasher/ counter when doing dishes due to pain.  Pain also occurs with sweeping , mopping , folding clothes   pt had LBP for 25 years , worsened after having her 2nd child. Radiates to back of back of calf.    Pt with oxygen portable machine.  PERTINENT HISTORY:  COPD             B lymphedema             2 vaginal deliveries with episiotomies, stitches              Scoliosis             Gallbladder removed   PAIN:  Are you having pain? Yes: see above   PRECAUTIONS:  no lifting greater 10 lb due to back issues   WEIGHT BEARING RESTRICTIONS: No  FALLS:  Has patient fallen in last 6 months? Yes, tripped over cinder block , and incurred scapes , no bruises. Only agitated her back   LIVING ENVIRONMENT: Lives with: alone sometimes stays with son  Lives in: mobile home  Stairs: 4 STE with rail  Has following equipment at home: rollator   OCCUPATION:  diasbled, hobbies crochet, puzzles, fishing   PLOF: IND    PATIENT GOALS:  Not have to carry a bag with clothes to change into and diapers and to wean from diapers to pads    OBJECTIVE:   [facility] PT Assessment - 06/25/23 0825       Observation/Other Assessments   Scoliosis R shoulder, iliac crest lowered  ( post Tx: levelled pelvis and shoulders with shoe lift in R toe box and heel )      AROM    Overall AROM Comments B sidebend, rotation without pain      Strength   Overall Strength Comments R LE 3/5, L 4/5      Ambulation/Gait   Gait Comments 0.8 m/s , decreased R stance, R trunk lean , carrying portable oxgen tank    ( post Tx: with shoe lift r shoe toe/ heel : 1 0.2 m/s reciporcal gait) and  no R trunk lean)              [facility] Adult PT Treatment/Exercise - 06/25/23 0825       Therapeutic Activites    Therapeutic Activities Other Therapeutic Activities    Other Therapeutic Activities Applied shoe lift in toe box, heel in R shoe  explained role of leg length difference and to try shoe lift in R shoe but remove if causes more pain      Neuro Re-ed    Neuro Re-ed Details  cued for sitting , sit to stand posture mechanics               HOME EXERCISE PROGRAM: See pt instruction section    ASSESSMENT:  CLINICAL IMPRESSION:   Pt is a  57  yo  who presents with the following deficits which impact QOL, ADL, fitness, and community activities: urinary leakage occurs coughing, sit to stand,  and also leakage occurs before making it to the bathroom.  B radiating LBP  Pt's musculoskeletal assessment revealed uneven  pelvic girdle and shoulder height, asymmetries to gait pattern, limited spinal /pelvic mobility, dyscoordination and strength of pelvic floor mm, hip weakness, poor body mechanics which places strain on the abdominal/pelvic floor mm. These are deficits that indicate an ineffective intraabdominal pressure system associated with increased risk for pt's Sx.   Pt was provided education on etiology of Sx with anatomy, physiology explanation with images along with the benefits of customized pelvic PT Tx based on pt's medical conditions and musculoskeletal deficits.  Explained the physiology of deep core mm coordination and roles of pelvic floor function in urination, defecation, sexual function, and postural control with deep core mm system.   Regional  interdependent approaches will yield greater benefits in pt's POC due to the complexity of pt's medical Hx and the significant impact their Sx have had on their QOL.Marland Kitchen Plan to build interdisciplinary team with pt's providers to optimize patient-centered care.   Following Tx today which pt tolerated without complaints,  pt demo'd proper body mechanics to minimize straining pelvic floor with sit to stand and sitting posture Provided shoe lift in heel and toe box in R shoe to address leg length difference. Pt demo'd levelled pelvic girdle and increased gait  speed and no longer showed R trunk lean. Pt reported feeling better and less leaning with walking with the shoe lift.  Educated pt to remove if causes more pai.   Plan to address realignment of spine/ pelvis at next session to help promote optimize IAP system for improved pelvic floor function, trunk stability, gait, balance, stabilization with mobility tasks.  Plan to address pelvic floor issues once pelvis and spine are realigned to yield better outcomes.    OBJECTIVE IMPAIRMENTS decreased activity tolerance, decreased coordination, decreased endurance, decreased mobility, difficulty walking, decreased ROM, decreased strength, decreased safety awareness, hypomobility, increased muscle spasms, impaired flexibility, improper body mechanics, postural dysfunction, and pain. scar restrictions   ACTIVITY LIMITATIONS  self-care,  sleep, home chores, work tasks    PARTICIPATION LIMITATIONS:  community, gym activities    PERSONAL FACTORS        are also affecting patient's functional outcome.    REHAB POTENTIAL: Good   CLINICAL DECISION MAKING: Evolving/moderate complexity   EVALUATION COMPLEXITY: Moderate    PATIENT EDUCATION:    Education details: Showed pt anatomy images. Explained muscles attachments/ connection, physiology of deep core system/ spinal- thoracic-pelvis-lower kinetic chain as they relate to pt's presentation, Sx, and past Hx.  Explained what and how these areas of deficits need to be restored to balance and function    See Therapeutic activity / neuromuscular re-education section  Answered pt's questions.   Person educated: Patient Education method: Explanation, Demonstration, Tactile cues, Verbal cues, and Handouts Education comprehension: verbalized understanding, returned demonstration, verbal cues required, tactile cues required, and needs further education     PLAN: PT FREQUENCY: 1x/week   PT DURATION: 10 weeks   PLANNED INTERVENTIONS: Therapeutic exercises, Therapeutic activity, Neuromuscular re-education, Balance training, Gait training, Patient/Family education, Self Care, Joint mobilization, Spinal mobilization, Moist heat, Taping, and Manual therapy, dry needling.   PLAN FOR NEXT SESSION: See clinical impression for plan     GOALS: Goals reviewed with patient? Yes  SHORT TERM GOALS: Target date: 07/23/2023    Pt will demo IND with HEP                    Baseline: Not IND            Goal status: INITIAL   LONG  TERM GOALS: Target date: 09/03/2023    1.Pt will demo proper deep core coordination without chest breathing and optimal excursion of diaphragm/pelvic floor in order to promote spinal stability and pelvic floor function  Baseline: dyscoordination Goal status: INITIAL  2.  Pt will demo > 5 pt change on FOTO  to improve QOL and function  PFDI Urinary baseline - 71 Lower score = better function   Pelvic Pain baseline - 71 Lower score = better function  Bowel  constipation baseline -   Higher score = better function  PFDI Bowel - 13 Higher score = better function  Lumber baseline  - 39 Higher score = better function   Goal status: INITIAL  3.  Pt will demo proper body mechanics in against gravity tasks and ADLs  work tasks, fitness  to minimize straining pelvic floor / back    Baseline: not IND, improper form that places strain on pelvic floor  Goal status:  INITIAL    4. Pt will demo increased gait speed > 1.3 m/s with reciprocal gait pattern, longer stride length  in order to ambulate safely in community and return to fitness routine  Baseline: 0.8 m/s , decreased R stance, R trunk lean , carrying portable oxgen tank   Goal status: INITIAL    5. Pt will report decreased diapers < 3 diapers during the day  and night in order improve hygiene and QOL  Baseline:  Wears  4 Depend diapers during the day and 2-5 x  night.   Goal status: INITIAL   6. Pt will report nocturia < 3 x night  to improve sleep quality  Baseline: nocturia 2-5 x night  Goal status: INITIAL  7. Pt will report no longer having to lean against dishwasher when doing dishes  Baseline:Has to lean on dishwasher/ counter when doing dishes, sweeping , mopping , folding clothes  Goal:  INITIAL   8. Pt will demo levelled pelvic girdle and shoulder height in order to progress to deep core strengthening HEP and restore mobility at spine, pelvis, gait, posture minimize falls, and improve balance   Baseline: R shoulder, iliac crest lowered  Goal:  INITIAL  (  shoe lift in R toe box and heel provided on eval, demo'd levelled girdle and spine)     Mariane Masters, PT 06/25/2023, 8:25 AM

## 2023-07-01 ENCOUNTER — Ambulatory Visit: Payer: 59 | Admitting: Physical Therapy

## 2023-07-02 ENCOUNTER — Ambulatory Visit: Payer: 59 | Admitting: Physical Therapy

## 2023-07-02 DIAGNOSIS — R2689 Other abnormalities of gait and mobility: Secondary | ICD-10-CM | POA: Diagnosis not present

## 2023-07-02 DIAGNOSIS — M533 Sacrococcygeal disorders, not elsewhere classified: Secondary | ICD-10-CM

## 2023-07-02 DIAGNOSIS — M4125 Other idiopathic scoliosis, thoracolumbar region: Secondary | ICD-10-CM

## 2023-07-02 NOTE — Therapy (Signed)
OUTPATIENT PHYSICAL THERAPY TREATMENT    Patient Name: Bonnie Gomez MRN: [id number] DOB:22-Dec-1965, 57 y.o., female Today's Date: 07/02/2023   PT End of Session - 07/02/23 1031     Visit Number 2    Number of Visits 10    Date for PT Re-Evaluation 09/03/23    PT Start Time 1027    PT Stop Time 1105    PT Time Calculation (min) 38 min    Activity Tolerance Patient tolerated treatment well;No increased pain    Behavior During Therapy [facility] for tasks assessed/performed             Past Medical History:  Diagnosis Date   Allergy    Anxiety    Arthritis    Asthma 2001   COPD (chronic obstructive pulmonary disease) (HCC) 2013   Depression    Emphysema lung (HCC)    Heart abnormality    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   Hiatal hernia    Hypertension    Insomnia    Overactive bladder    Pneumonia    2013   Thyroid disease    Vitamin D deficiency    Past Surgical History:  Procedure Laterality Date   ANGIOPLASTY  2008, 2012   CHOLECYSTECTOMY  1985   COLONOSCOPY  2013   GYNECOLOGIC CRYOSURGERY  1993   SKIN BIOPSY  2006   TUBAL LIGATION  1992   UPPER GI ENDOSCOPY  2013   [place]   Patient Active Problem List   Diagnosis Date Noted   Venous (peripheral) insufficiency 07/03/2022   Cellulitis and abscess of leg, except foot 07/03/2022   Lymphedema 06/17/2021   Swelling of limb 03/04/2021   Essential hypertension 03/04/2021   Pain in limb 03/04/2021   Neck pain 11/17/2017   Post-menopausal 05/03/2017   COPD exacerbation (HCC) 02/29/2016   Condyloma acuminatum in female 04/29/2015   Perineal mass in female 04/11/2015   Anxiety 12/05/2013   Sleep apnea 12/05/2013   Migraine 12/05/2013    PCP: Clint Guy   REFERRING PROVIDER: MacDiarmid   REFERRING DIAG:  Mixed Incontinence  Rationale for Evaluation and Treatment Rehabilitation  THERAPY DIAG:  Other abnormalities of gait and mobility  Other idiopathic scoliosis, thoracolumbar region  Sacrococcygeal  disorders, not elsewhere classified  ONSET DATE:   SUBJECTIVE:                                                                                                                                                                                            SUBJECTIVE STATEMENT TODAY:   Pt noticed the shoe lift in R shoe helped pt to look forward when  walking and felt more confidence to not look down for balance. Even with walking her new puppy, she is looking up when walking instead of looking down and wobbling.   Pt reported no radiating LBP to calf until she got to the cash register when shopping and she did not have to lean onto the cart until she arrived the cashier compared to previously without shoe lift , she would lean on the cart when arrived to shop. Pt typically shops for 20 -30 min before getting to cashier .      SUBJECTIVE STATEMENT ON EVAL 06/25/23: 1) urinary leakage occurs coughing, sit to stand,  and also leakage occurs before making it to the bathroom. Pt has to apologize to store owners when she leaked to the floor.  Wears  4 Depend diapers during the day and 2-5 x  night.   Nocturia 2-5 x night.  Leakage started after menopause   2)B radiating LBP ( mostly radiating on LLE) :  At worst 8/10.  Has to lean on dishwasher/ counter when doing dishes due to pain.  Pain also occurs with sweeping , mopping , folding clothes   pt had LBP for 25 years , worsened after having her 2nd child. Radiates to back of back of calf.    Pt with oxygen portable machine.  PERTINENT HISTORY:  COPD             B lymphedema             2 vaginal deliveries with episiotomies, stitches              Scoliosis             Gallbladder removed   PAIN:  Are you having pain? Yes: see above   PRECAUTIONS:  no lifting greater 10 lb due to back issues   WEIGHT BEARING RESTRICTIONS: No  FALLS:  Has patient fallen in last 6 months? Yes, tripped over cinder block , and incurred scapes , no bruises. Only  agitated her back   LIVING ENVIRONMENT: Lives with: alone sometimes stays with son  Lives in: mobile home  Stairs: 4 STE with rail  Has following equipment at home: rollator   OCCUPATION:  diasbled, hobbies crochet, puzzles, fishing   PLOF: IND    PATIENT GOALS:  Not have to carry a bag with clothes to change into and diapers and to wean from diapers to pads    OBJECTIVE:    [facility] PT Assessment - 07/02/23 1032       Observation/Other Assessments   Scoliosis R shoulder higher, R iliac crest higher      Strength   Overall Strength Comments R LE 3/5, L 4/5      Ambulation/Gait   Gait Comments 0.96 m/s ( equal stance , less trunk lean with R shoe lift from last session)             [facility] Adult PT Treatment/Exercise - 07/02/23 1032       Therapeutic Activites    Other Therapeutic Activities explained future sessions to help with mobility, endurance with walking to improve lymphedema and COPD, explained where to buy more shoe lifts to replace every 6 months, explained with future sessions to help with baalnce training, explained deep cre training at future sessions to help with pelvic floor and lumbar pain.      Neuro Re-ed    Neuro Re-ed Details  cued for balance and SIJ mobility HEP  HOME EXERCISE PROGRAM: See pt instruction section    ASSESSMENT:  CLINICAL IMPRESSION:  Good improvements since 1st sessions:    Pt noticed the shoe lift in R shoe helped pt to look forward when walking and felt more confidence to not look down for balance. Even with walking her new puppy, she is looking up when walking instead of looking down and wobbling.   Pt reported no radiating LBP to calf until she got to the cash register when shopping and she did not have to lean onto the cart until she arrived the cashier compared to previously without shoe lift , she would lean on the cart when arrived to shop. Pt typically shops for 20 -30 min before getting to  cashier .      First visit: 0.8 m/s , decreased R stance, R trunk lean , carrying portable oxgen tank     Second visit today: 0.96 m/s ( equal stance , less trunk lean with R shoe lift from last session)    Explained future sessions to help with mobility, endurance with walking to improve lymphedema and COPD, explained where to buy more shoe lifts to replace every 6 months, explained with future sessions to help with baalnce training, explained deep cre training at future sessions to help with pelvic floor and lumbar pain.   Plan to add deep core training to help promote optimize IAP system for improved pelvic floor function, trunk stability, gait, balance, stabilization with mobility tasks.  Plan to address pelvic floor issues once pelvis and spine are realigned to yield better outcomes.    OBJECTIVE IMPAIRMENTS decreased activity tolerance, decreased coordination, decreased endurance, decreased mobility, difficulty walking, decreased ROM, decreased strength, decreased safety awareness, hypomobility, increased muscle spasms, impaired flexibility, improper body mechanics, postural dysfunction, and pain. scar restrictions   ACTIVITY LIMITATIONS  self-care,  sleep, home chores, work tasks    PARTICIPATION LIMITATIONS:  community, gym activities    PERSONAL FACTORS        are also affecting patient's functional outcome.    REHAB POTENTIAL: Good   CLINICAL DECISION MAKING: Evolving/moderate complexity   EVALUATION COMPLEXITY: Moderate    PATIENT EDUCATION:    Education details: Showed pt anatomy images. Explained muscles attachments/ connection, physiology of deep core system/ spinal- thoracic-pelvis-lower kinetic chain as they relate to pt's presentation, Sx, and past Hx. Explained what and how these areas of deficits need to be restored to balance and function    See Therapeutic activity / neuromuscular re-education section  Answered pt's questions.   Person educated:  Patient Education method: Explanation, Demonstration, Tactile cues, Verbal cues, and Handouts Education comprehension: verbalized understanding, returned demonstration, verbal cues required, tactile cues required, and needs further education     PLAN: PT FREQUENCY: 1x/week   PT DURATION: 10 weeks   PLANNED INTERVENTIONS: Therapeutic exercises, Therapeutic activity, Neuromuscular re-education, Balance training, Gait training, Patient/Family education, Self Care, Joint mobilization, Spinal mobilization, Moist heat, Taping, and Manual therapy, dry needling.   PLAN FOR NEXT SESSION: See clinical impression for plan     GOALS: Goals reviewed with patient? Yes  SHORT TERM GOALS: Target date: 07/23/2023    Pt will demo IND with HEP                    Baseline: Not IND            Goal status: INITIAL   LONG TERM GOALS: Target date: 09/03/2023    1.Pt will demo proper deep core coordination  without chest breathing and optimal excursion of diaphragm/pelvic floor in order to promote spinal stability and pelvic floor function  Baseline: dyscoordination Goal status: INITIAL  2.  Pt will demo > 5 pt change on FOTO  to improve QOL and function  PFDI Urinary baseline - 71 Lower score = better function   Pelvic Pain baseline - 71 Lower score = better function  Bowel  constipation baseline -   Higher score = better function  PFDI Bowel - 13 Higher score = better function  Lumber baseline  - 39 Higher score = better function   Goal status: INITIAL  3.  Pt will demo proper body mechanics in against gravity tasks and ADLs  work tasks, fitness  to minimize straining pelvic floor / back    Baseline: not IND, improper form that places strain on pelvic floor  Goal status: INITIAL    4. Pt will demo increased gait speed > 1.3 m/s with reciprocal gait pattern, longer stride length  in order to ambulate safely in community and return to fitness routine  Baseline: 0.8 m/s , decreased R  stance, R trunk lean , carrying portable oxgen tank   Goal status: INITIAL    5. Pt will report decreased diapers < 3 diapers during the day  and night in order improve hygiene and QOL  Baseline:  Wears  4 Depend diapers during the day and 2-5 x  night.   Goal status: INITIAL   6. Pt will report nocturia < 3 x night  to improve sleep quality  Baseline: nocturia 2-5 x night  Goal status: INITIAL  7. Pt will report no longer having to lean against dishwasher when doing dishes  Baseline:Has to lean on dishwasher/ counter when doing dishes, sweeping , mopping , folding clothes  Goal:  INITIAL   8. Pt will demo levelled pelvic girdle and shoulder height in order to progress to deep core strengthening HEP and restore mobility at spine, pelvis, gait, posture minimize falls, and improve balance   Baseline: R shoulder, iliac crest lowered  Goal:  INITIAL  (  shoe lift in R toe box and heel provided on eval, demo'd levelled girdle and spine)     Mariane Masters, PT 07/02/2023, 10:34 AM

## 2023-07-02 NOTE — Patient Instructions (Signed)
   3- foot tap  10 reps  Each side   Hold onto wall   Slightly bend of standing knee, and keep hips above foot   [place] of opposite leg   taps to each direction and   back to spot under hips- notice equal pressure through both legs, and across [place] and heels    ___

## 2023-07-07 ENCOUNTER — Ambulatory Visit: Payer: 59 | Admitting: Physical Therapy

## 2023-07-07 DIAGNOSIS — M533 Sacrococcygeal disorders, not elsewhere classified: Secondary | ICD-10-CM

## 2023-07-07 DIAGNOSIS — R2689 Other abnormalities of gait and mobility: Secondary | ICD-10-CM | POA: Diagnosis not present

## 2023-07-07 DIAGNOSIS — M4125 Other idiopathic scoliosis, thoracolumbar region: Secondary | ICD-10-CM

## 2023-07-07 NOTE — Therapy (Addendum)
OUTPATIENT PHYSICAL THERAPY TREATMENT    Patient Name: Bonnie Gomez MRN: [id number] DOB:1966/05/12, 57 y.o., female Today's Date: 07/07/2023   PT End of Session - 07/07/23 1430     Visit Number 3    Number of Visits 10    Date for PT Re-Evaluation 09/03/23    PT Start Time 1426    PT Stop Time 1505    PT Time Calculation (min) 39 min    Activity Tolerance Patient tolerated treatment well;No increased pain    Behavior During Therapy [facility] for tasks assessed/performed             Past Medical History:  Diagnosis Date   Allergy    Anxiety    Arthritis    Asthma 2001   COPD (chronic obstructive pulmonary disease) (HCC) 2013   Depression    Emphysema lung (HCC)    Heart abnormality    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   Hiatal hernia    Hypertension    Insomnia    Overactive bladder    Pneumonia    2013   Thyroid disease    Vitamin D deficiency    Past Surgical History:  Procedure Laterality Date   ANGIOPLASTY  2008, 2012   CHOLECYSTECTOMY  1985   COLONOSCOPY  2013   GYNECOLOGIC CRYOSURGERY  1993   SKIN BIOPSY  2006   TUBAL LIGATION  1992   UPPER GI ENDOSCOPY  2013   [facility]   Patient Active Problem List   Diagnosis Date Noted   Venous (peripheral) insufficiency 07/03/2022   Cellulitis and abscess of leg, except foot 07/03/2022   Lymphedema 06/17/2021   Swelling of limb 03/04/2021   Essential hypertension 03/04/2021   Pain in limb 03/04/2021   Neck pain 11/17/2017   Post-menopausal 05/03/2017   COPD exacerbation (HCC) 02/29/2016   Condyloma acuminatum in female 04/29/2015   Perineal mass in female 04/11/2015   Anxiety 12/05/2013   Sleep apnea 12/05/2013   Migraine 12/05/2013    PCP: Clint Guy   REFERRING PROVIDER: MacDiarmid   REFERRING DIAG:  Mixed Incontinence  Rationale for Evaluation and Treatment Rehabilitation  THERAPY DIAG:  Other abnormalities of gait and mobility  Other idiopathic scoliosis, thoracolumbar region  Sacrococcygeal  disorders, not elsewhere classified  ONSET DATE:   SUBJECTIVE:                                                                                                                                                                                            SUBJECTIVE STATEMENT TODAY:   Pt noticed the shoe lift in R shoe still helps.  Pt has not  had charlie horse in her L calf  while walking for the past weeks after shoe lift was provided. Pt has a new puppy and she takes it out with or without a leash but not for long distances. Pt takes her oxygen machine when she is out in the community.   SUBJECTIVE STATEMENT ON EVAL 06/25/23: 1) urinary leakage occurs coughing, sit to stand,  and also leakage occurs before making it to the bathroom. Pt has to apologize to store owners when she leaked to the floor.  Wears  4 Depend diapers during the day and 2-5 x  night.   Nocturia 2-5 x night.  Leakage started after menopause   2)B radiating LBP ( mostly radiating on LLE) :  At worst 8/10.  Has to lean on dishwasher/ counter when doing dishes due to pain.  Pain also occurs with sweeping , mopping , folding clothes   pt had LBP for 25 years , worsened after having her 2nd child. Radiates to back of back of calf.    Pt with oxygen portable machine.  PERTINENT HISTORY:  COPD             B lymphedema             2 vaginal deliveries with episiotomies, stitches              Scoliosis             Gallbladder removed   PAIN:  Are you having pain? Yes: see above   PRECAUTIONS:  no lifting greater 10 lb due to back issues   WEIGHT BEARING RESTRICTIONS: No  FALLS:  Has patient fallen in last 6 months? Yes, tripped over cinder block , and incurred scapes , no bruises. Only agitated her back   LIVING ENVIRONMENT: Lives with: alone sometimes stays with son  Lives in: mobile home  Stairs: 4 STE with rail  Has following equipment at home: rollator   OCCUPATION:  diasbled, hobbies crochet, puzzles, fishing    PLOF: IND    PATIENT GOALS:  Not have to carry a bag with clothes to change into and diapers and to wean from diapers to pads    OBJECTIVE:    [facility] PT Assessment - 07/07/23 1451       6 minute walk test results    Endurance additional comments 960 without O2 94% , 84 HR , MOdified  BORG at 4:50 , Moderate difficulty with breathing  94 % SO2, 94 HR,  at 6 :00 moderate 94 % SO2, 91 HR,  .             [facility] Adult PT Treatment/Exercise - 07/07/23 1506       Ambulation/Gait   Gait Comments assessed 6 MWT without Os, monitors SO2 sat and HR, modified BORG measure      Therapeutic Activites    Other Therapeutic Activities assessed gait, explained walking routine with stretches to build endurance      Neuro Re-ed    Neuro Re-ed Details  cued for stretches                HOME EXERCISE PROGRAM: See pt instruction section    ASSESSMENT:  CLINICAL IMPRESSION:   Assessed 6 MWT without oxygen today. Pt achieved 960 feet , Moderate level of exertion based Modified BORG, no significant decrease of oxygen.  Encouraged pt to walk with oxgyen for 15 min on even ground 2 x a day along with stretches after walking. Cued  for walking stretches techniques.  Pt demo'd correctly post Tx.  Anticipate as pt increases her aerobic activity, she will be able to manage COPD and lymphedema now that she is walking better with shoe lift.   Pt's mobility and endurance will help with her getting to bathroom in public places before leakage.    Plan to add deep core training to help promote optimize IAP system for improved pelvic floor function, trunk stability, gait, balance, stabilization with mobility tasks.  Plan to address pelvic floor issues once pelvis and spine are realigned to yield better outcomes.    OBJECTIVE IMPAIRMENTS decreased activity tolerance, decreased coordination, decreased endurance, decreased mobility, difficulty walking, decreased ROM, decreased strength, decreased safety  awareness, hypomobility, increased muscle spasms, impaired flexibility, improper body mechanics, postural dysfunction, and pain. scar restrictions   ACTIVITY LIMITATIONS  self-care,  sleep, home chores, work tasks    PARTICIPATION LIMITATIONS:  community, gym activities    PERSONAL FACTORS        are also affecting patient's functional outcome.    REHAB POTENTIAL: Good   CLINICAL DECISION MAKING: Evolving/moderate complexity   EVALUATION COMPLEXITY: Moderate    PATIENT EDUCATION:    Education details: Showed pt anatomy images. Explained muscles attachments/ connection, physiology of deep core system/ spinal- thoracic-pelvis-lower kinetic chain as they relate to pt's presentation, Sx, and past Hx. Explained what and how these areas of deficits need to be restored to balance and function    See Therapeutic activity / neuromuscular re-education section  Answered pt's questions.   Person educated: Patient Education method: Explanation, Demonstration, Tactile cues, Verbal cues, and Handouts Education comprehension: verbalized understanding, returned demonstration, verbal cues required, tactile cues required, and needs further education     PLAN: PT FREQUENCY: 1x/week   PT DURATION: 10 weeks   PLANNED INTERVENTIONS: Therapeutic exercises, Therapeutic activity, Neuromuscular re-education, Balance training, Gait training, Patient/Family education, Self Care, Joint mobilization, Spinal mobilization, Moist heat, Taping, and Manual therapy, dry needling.   PLAN FOR NEXT SESSION: See clinical impression for plan     GOALS: Goals reviewed with patient? Yes  SHORT TERM GOALS: Target date: 07/23/2023    Pt will demo IND with HEP                    Baseline: Not IND            Goal status: INITIAL   LONG TERM GOALS: Target date: 09/03/2023    1.Pt will demo proper deep core coordination without chest breathing and optimal excursion of diaphragm/pelvic floor in order to promote  spinal stability and pelvic floor function  Baseline: dyscoordination Goal status: INITIAL  2.  Pt will demo > 5 pt change on FOTO  to improve QOL and function  PFDI Urinary baseline - 71 Lower score = better function   Pelvic Pain baseline - 71 Lower score = better function  Bowel  constipation baseline -   Higher score = better function  PFDI Bowel - 13 Higher score = better function  Lumber baseline  - 39 Higher score = better function   Goal status: INITIAL  3.  Pt will demo proper body mechanics in against gravity tasks and ADLs  work tasks, fitness  to minimize straining pelvic floor / back    Baseline: not IND, improper form that places strain on pelvic floor  Goal status: INITIAL    4. Pt will demo increased gait speed > 1.3 m/s with reciprocal gait pattern, longer stride length  in order to ambulate safely in community and return to fitness routine  Baseline: 0.8 m/s , decreased R stance, R trunk lean , carrying portable oxgen tank   Goal status: INITIAL    5. Pt will report decreased diapers < 3 diapers during the day  and night in order improve hygiene and QOL  Baseline:  Wears  4 Depend diapers during the day and 2-5 x  night.   Goal status: INITIAL   6. Pt will report nocturia < 3 x night  to improve sleep quality  Baseline: nocturia 2-5 x night  Goal status: INITIAL  7. Pt will report no longer having to lean against dishwasher when doing dishes  Baseline:Has to lean on dishwasher/ counter when doing dishes, sweeping , mopping , folding clothes  Goal:  INITIAL   8. Pt will demo levelled pelvic girdle and shoulder height in order to progress to deep core strengthening HEP and restore mobility at spine, pelvis, gait, posture minimize falls, and improve balance   Baseline: R shoulder, iliac crest lowered  Goal:  MET (  shoe lift in R toe box and heel provided on eval, demo'd levelled girdle and spine)     Mariane Masters, PT 07/07/2023, 2:31  PM

## 2023-07-07 NOTE — Patient Instructions (Signed)
Walk 15 min with oxygen in morning and again 15 min in afternoon   On even ground   __  Then stretches:   Lunge positon L foot back, L arm up  Glide    Twist    Step front foot against wall for calf stretch Knee bents for both legs     Seated figure on the edge of bed, other leg back like a lunge

## 2023-07-08 ENCOUNTER — Ambulatory Visit: Payer: 59 | Admitting: Physical Therapy

## 2023-07-22 ENCOUNTER — Ambulatory Visit: Payer: 59 | Attending: Urology | Admitting: Physical Therapy

## 2023-07-22 DIAGNOSIS — R2689 Other abnormalities of gait and mobility: Secondary | ICD-10-CM | POA: Diagnosis present

## 2023-07-22 DIAGNOSIS — M4125 Other idiopathic scoliosis, thoracolumbar region: Secondary | ICD-10-CM | POA: Diagnosis present

## 2023-07-22 DIAGNOSIS — M533 Sacrococcygeal disorders, not elsewhere classified: Secondary | ICD-10-CM | POA: Diagnosis present

## 2023-07-22 IMAGING — CR DG CHEST 2V
2 series · 2 of 2 positions shown · non-contrast
Comparison: 01/14/2021

CLINICAL DATA: Shortness of breath

EXAM:
CHEST - 2 VIEW

[chest lat]
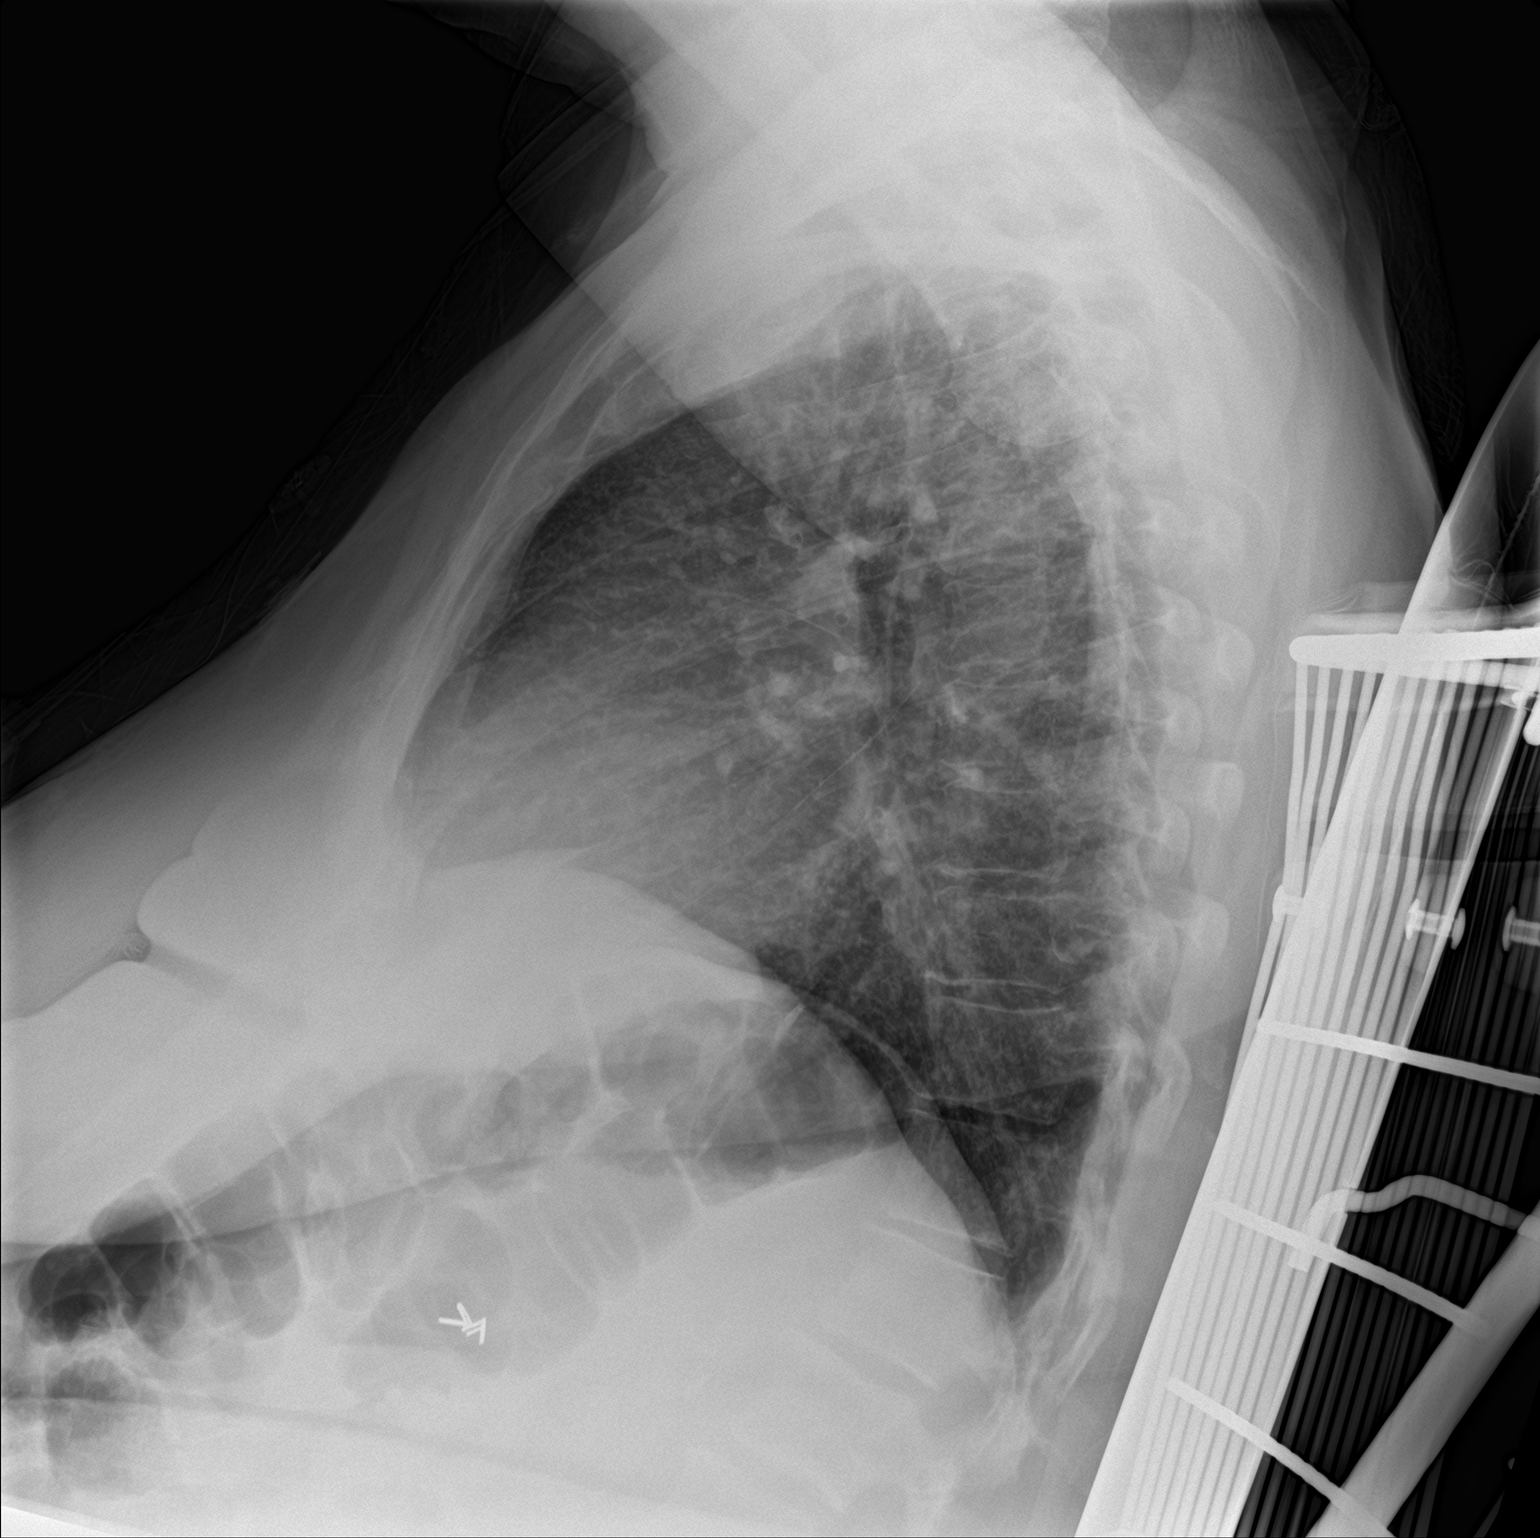

[chest ap]
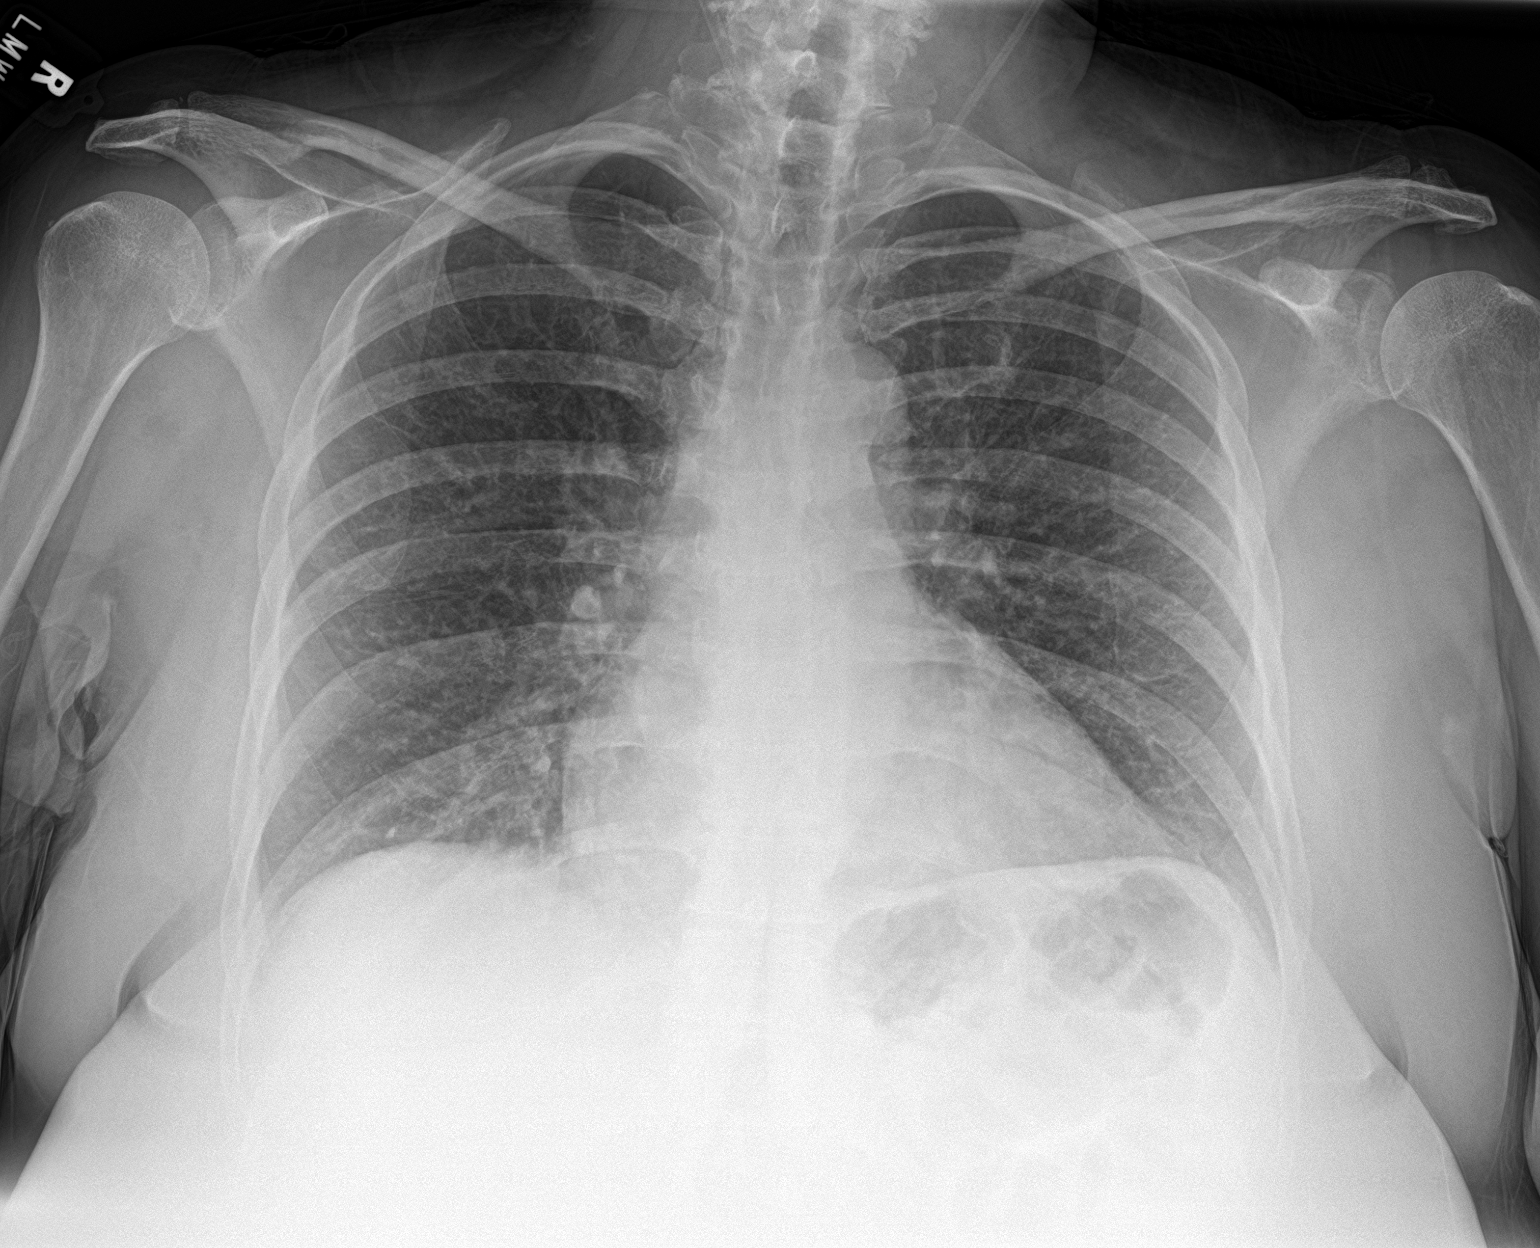

[2 of 2 positions shown; findings below may reference images not displayed]

FINDINGS: Cardiac shadow is mildly prominent but stable. Lungs are well
aerated bilaterally. Bronchitic markings are again identified
similar to that seen on the prior exam of a chronic nature. No focal
infiltrate or sizable effusion is seen. No acute bony abnormality is
noted.
IMPRESSION: Chronic bronchitic markings without acute abnormality.

## 2023-07-22 NOTE — Patient Instructions (Addendum)
 Rearrange 3 pillows with   One placed lengthwise to put under the midback as a ramp to the 2 on the bottom   Not causing neck to go forward head   __  Handout ( deep core level 1-2) twice a day   Keep up walking    ___   Avoid straining pelvic floor, abdominal muscles , spine  Use log rolling technique instead of getting out of bed with your neck or the sit-up     Log rolling into and out of bed   Log rolling into and out of bed If getting out of bed on R side, Bent knees, scoot hips/ shoulder to L  Raise R arm completely overhead, rolling onto armpit  Then lower bent knees to bed to get into complete side lying position  Then drop legs off bed, and push up onto R elbow/forearm, and use L hand to push onto the bed    Dig elbows and feet to lift hte buttocks and scoot without lifting head

## 2023-07-22 NOTE — Therapy (Signed)
 OUTPATIENT PHYSICAL THERAPY TREATMENT     Patient Name: Bonnie Gomez MRN: [id number] DOB:Dec 31, 1965, 58 y.o., female Today's Date: 07/22/2023   PT End of Session - 07/22/23 1352     Visit Number 4    Number of Visits 10    Date for PT Re-Evaluation 09/03/23    PT Start Time 1348    PT Stop Time 1426    PT Time Calculation (min) 38 min    Activity Tolerance Patient tolerated treatment well;No increased pain    Behavior During Therapy [facility] for tasks assessed/performed             Past Medical History:  Diagnosis Date   Allergy    Anxiety    Arthritis    Asthma 2001   COPD (chronic obstructive pulmonary disease) (HCC) 2013   Depression    Emphysema lung (HCC)    Heart abnormality    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   Hiatal hernia    Hypertension    Insomnia    Overactive bladder    Pneumonia    2013   Thyroid disease    Vitamin D deficiency    Past Surgical History:  Procedure Laterality Date   ANGIOPLASTY  2008, 2012   CHOLECYSTECTOMY  1985   COLONOSCOPY  2013   GYNECOLOGIC CRYOSURGERY  1993   SKIN BIOPSY  2006   TUBAL LIGATION  1992   UPPER GI ENDOSCOPY  2013   [place]   Patient Active Problem List   Diagnosis Date Noted   Venous (peripheral) insufficiency 07/03/2022   Cellulitis and abscess of leg, except foot 07/03/2022   Lymphedema 06/17/2021   Swelling of limb 03/04/2021   Essential hypertension 03/04/2021   Pain in limb 03/04/2021   Neck pain 11/17/2017   Post-menopausal 05/03/2017   COPD exacerbation (HCC) 02/29/2016   Condyloma acuminatum in female 04/29/2015   Perineal mass in female 04/11/2015   Anxiety 12/05/2013   Sleep apnea 12/05/2013   Migraine 12/05/2013    PCP: Donal   REFERRING PROVIDER: MacDiarmid   REFERRING DIAG:  Mixed Incontinence  Rationale for Evaluation and Treatment Rehabilitation  THERAPY DIAG:  Other abnormalities of gait and mobility  Other idiopathic scoliosis, thoracolumbar region  Sacrococcygeal  disorders, not elsewhere classified  ONSET DATE:   SUBJECTIVE:                                                                                                                                                                                            SUBJECTIVE STATEMENT TODAY:   Pt practiced walking with out the dogs and the O2 machine,  LBP  is not as bad and she is able to shop in two stores in one afternoon without  leaning onto the cart.   SUBJECTIVE STATEMENT ON EVAL 06/25/23: 1) urinary leakage occurs coughing, sit to stand,  and also leakage occurs before making it to the bathroom. Pt has to apologize to store owners when she leaked to the floor.  Wears  4 Depend diapers during the day and 2-5 x  night.   Nocturia 2-5 x night.  Leakage started after menopause   2)B radiating LBP ( mostly radiating on LLE) :  At worst 8/10.  Has to lean on dishwasher/ counter when doing dishes due to pain.  Pain also occurs with sweeping , mopping , folding clothes   pt had LBP for 25 years , worsened after having her 2nd child. Radiates to back of back of calf.    Pt with oxygen portable machine.  PERTINENT HISTORY:  COPD             B lymphedema             2 vaginal deliveries with episiotomies, stitches              Scoliosis             Gallbladder removed   PAIN:  Are you having pain? Yes: see above   PRECAUTIONS:  no lifting greater 10 lb due to back issues   WEIGHT BEARING RESTRICTIONS: No  FALLS:  Has patient fallen in last 6 months? Yes, tripped over cinder block , and incurred scapes , no bruises. Only agitated her back   LIVING ENVIRONMENT: Lives with: alone sometimes stays with son  Lives in: mobile home  Stairs: 4 STE with rail  Has following equipment at home: rollator   OCCUPATION:  diasbled, hobbies crochet, puzzles, fishing   PLOF: IND    PATIENT GOALS:  Not have to carry a bag with clothes to change into and diapers and to wean from diapers to pads     OBJECTIVE:     [facility] PT Assessment - 07/22/23 1356       Observation/Other Assessments   Scoliosis levelled shoulders and pelvis      Strength   Overall Strength Comments R LE 4/5, L 5/5      Ambulation/Gait   Gait Comments minimal hip flexion, arm swing      6 minute walk test results    Endurance additional comments 1200 , BORG modified 2             [facility] Adult PT Treatment/Exercise - 07/22/23 1415       Ambulation/Gait   Gait Comments cued for armswings, lifting knees      Therapeutic Activites    Other Therapeutic Activities educated on logrolling into/ out bed to minimize shoulder/elbow and back injury, explained how to arrange pillows as an incline for optimal breathing with COPD      Neuro Re-ed    Neuro Re-ed Details  cued for deep core 1-2 ( handout) ,              \\\   HOME EXERCISE PROGRAM: See pt instruction section    ASSESSMENT:  CLINICAL IMPRESSION:  Improvements:  Pt practiced walking with out the dogs and the O2 machine.  LBP is not as bad and she is able to shop in two stores in one afternoon without  leaning onto the cart.   6 MWT improved with longer distance from 960 ft  to 1200 ft between last session and today.  Modified BORG scale improved from 4 to 2. Both 6WT done without supplemental O2.   Initiated deep core training with required cues for proper technique   Cued for log rolling for bed t/f to minimize straining back and pelvic floor. Cued for proper arrangement for pillows for semi-reclined position for optimal breathing with COPD.     Pt's mobility and endurance will help with her getting to bathroom in public places before leakage.    Anticipate deep core training to help promote optimize IAP system for improved pelvic floor function, trunk stability, gait, balance, stabilization with mobility tasks.  Plan to address pelvic floor issues once pelvis and spine are realigned to yield better outcomes.    OBJECTIVE  IMPAIRMENTS decreased activity tolerance, decreased coordination, decreased endurance, decreased mobility, difficulty walking, decreased ROM, decreased strength, decreased safety awareness, hypomobility, increased muscle spasms, impaired flexibility, improper body mechanics, postural dysfunction, and pain. scar restrictions   ACTIVITY LIMITATIONS  self-care,  sleep, home chores, work tasks    PARTICIPATION LIMITATIONS:  community, gym activities    PERSONAL FACTORS        are also affecting patient's functional outcome.    REHAB POTENTIAL: Good   CLINICAL DECISION MAKING: Evolving/moderate complexity   EVALUATION COMPLEXITY: Moderate    PATIENT EDUCATION:    Education details: Showed pt anatomy images. Explained muscles attachments/ connection, physiology of deep core system/ spinal- thoracic-pelvis-lower kinetic chain as they relate to pt's presentation, Sx, and past Hx. Explained what and how these areas of deficits need to be restored to balance and function    See Therapeutic activity / neuromuscular re-education section  Answered pt's questions.   Person educated: Patient Education method: Explanation, Demonstration, Tactile cues, Verbal cues, and Handouts Education comprehension: verbalized understanding, returned demonstration, verbal cues required, tactile cues required, and needs further education     PLAN: PT FREQUENCY: 1x/week   PT DURATION: 10 weeks   PLANNED INTERVENTIONS: Therapeutic exercises, Therapeutic activity, Neuromuscular re-education, Balance training, Gait training, Patient/Family education, Self Care, Joint mobilization, Spinal mobilization, Moist heat, Taping, and Manual therapy, dry needling.   PLAN FOR NEXT SESSION: See clinical impression for plan     GOALS: Goals reviewed with patient? Yes  SHORT TERM GOALS: Target date: 07/23/2023    Pt will demo IND with HEP                    Baseline: Not IND            Goal status: INITIAL   LONG TERM  GOALS: Target date: 09/03/2023    1.Pt will demo proper deep core coordination without chest breathing and optimal excursion of diaphragm/pelvic floor in order to promote spinal stability and pelvic floor function  Baseline: dyscoordination Goal status: INITIAL  2.  Pt will demo > 5 pt change on FOTO  to improve QOL and function  PFDI Urinary baseline - 71 Lower score = better function   Pelvic Pain baseline - 71 Lower score = better function  Bowel  constipation baseline -   Higher score = better function  PFDI Bowel - 13 Higher score = better function  Lumber baseline  - 39 Higher score = better function   Goal status: INITIAL  3.  Pt will demo proper body mechanics in against gravity tasks and ADLs  work tasks, fitness  to minimize straining pelvic floor / back    Baseline: not IND, improper form that places  strain on pelvic floor  Goal status: INITIAL    4. Pt will demo increased gait speed > 1.3 m/s with reciprocal gait pattern, longer stride length  in order to ambulate safely in community and return to fitness routine  Baseline: 0.8 m/s , decreased R stance, R trunk lean , carrying portable oxgen tank   Goal status: INITIAL    5. Pt will report decreased diapers < 3 diapers during the day  and night in order improve hygiene and QOL  Baseline:  Wears  4 Depend diapers during the day and 2-5 x  night.   Goal status: INITIAL   6. Pt will report nocturia < 3 x night  to improve sleep quality  Baseline: nocturia 2-5 x night  Goal status: INITIAL  7. Pt will report no longer having to lean against dishwasher when doing dishes  Baseline:Has to lean on dishwasher/ counter when doing dishes, sweeping , mopping , folding clothes  Goal:  INITIAL   8. Pt will demo levelled pelvic girdle and shoulder height in order to progress to deep core strengthening HEP and restore mobility at spine, pelvis, gait, posture minimize falls, and improve balance   Baseline: R  shoulder, iliac crest lowered  Goal:  MET (  shoe lift in R toe box and heel provided on eval, demo'd levelled girdle and spine)     Pia Lupe Plump, PT 07/22/2023, 1:53 PM

## 2023-07-29 ENCOUNTER — Ambulatory Visit: Payer: 59 | Admitting: Physical Therapy

## 2023-07-29 DIAGNOSIS — R2689 Other abnormalities of gait and mobility: Secondary | ICD-10-CM | POA: Diagnosis not present

## 2023-07-29 DIAGNOSIS — M533 Sacrococcygeal disorders, not elsewhere classified: Secondary | ICD-10-CM

## 2023-07-29 DIAGNOSIS — M4125 Other idiopathic scoliosis, thoracolumbar region: Secondary | ICD-10-CM

## 2023-07-29 NOTE — Patient Instructions (Signed)
Place band in "U"    band under ballmounds  while laying on back w/ knees bent   Lying on back, knees bent    band under ballmounds  while laying on back w/ knees bent  "W" exercise  10 reps x 2 sets   Band is placed under feet, knees bent, feet are hip width apart Hold band with thumbs point out, keep upper arm and elbow touching the bed the whole time  - inhale and then exhale pull bands by bending elbows hands move in a "w"  (feel shoulder blades squeezing)   ________________

## 2023-07-29 NOTE — Therapy (Signed)
 OUTPATIENT PHYSICAL THERAPY TREATMENT     Patient Name: Bonnie Gomez MRN: [id number] DOB:[birth year], 58 y.o., female Today's Date: 07/29/2023   PT End of Session - 07/29/23 1345     Visit Number 5    Number of Visits 10    Date for PT Re-Evaluation 09/03/23    PT Start Time 1341    PT Stop Time 1415    PT Time Calculation (min) 34 min    Activity Tolerance Patient tolerated treatment well;No increased pain    Behavior During Therapy [name] for tasks assessed/performed             Past Medical History:  Diagnosis Date   Allergy    Anxiety    Arthritis    Asthma 2001   COPD (chronic obstructive pulmonary disease) (HCC) 2013   Depression    Emphysema lung (HCC)    Heart abnormality    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   Hiatal hernia    Hypertension    Insomnia    Overactive bladder    Pneumonia    2013   Thyroid disease    Vitamin D deficiency    Past Surgical History:  Procedure Laterality Date   ANGIOPLASTY  2008, 2012   CHOLECYSTECTOMY  1985   COLONOSCOPY  2013   GYNECOLOGIC CRYOSURGERY  1993   SKIN BIOPSY  2006   TUBAL LIGATION  1992   UPPER GI ENDOSCOPY  2013   [facility]itt   Patient Active Problem List   Diagnosis Date Noted   Venous (peripheral) insufficiency 07/03/2022   Cellulitis and abscess of leg, except foot 07/03/2022   Lymphedema 06/17/2021   Swelling of limb 03/04/2021   Essential hypertension 03/04/2021   Pain in limb 03/04/2021   Neck pain 11/17/2017   Post-menopausal 05/03/2017   COPD exacerbation (HCC) 02/29/2016   Condyloma acuminatum in female 04/29/2015   Perineal mass in female 04/11/2015   Anxiety 12/05/2013   Sleep apnea 12/05/2013   Migraine 12/05/2013    PCP: Donal   REFERRING PROVIDER: MacDiarmid   REFERRING DIAG:  Mixed Incontinence  Rationale for Evaluation and Treatment Rehabilitation  THERAPY DIAG:  Other abnormalities of gait and mobility  Other idiopathic scoliosis, thoracolumbar region  Sacrococcygeal  disorders, not elsewhere classified  ONSET DATE:   SUBJECTIVE:                                                                                                                                                                                            SUBJECTIVE STATEMENT TODAY:   Pt reports she is not scared of walking. She is practicing marching. Pt  used a rhythmic song to help her with the deep core coordination HEP     SUBJECTIVE STATEMENT ON EVAL 06/25/23: 1) urinary leakage occurs coughing, sit to stand,  and also leakage occurs before making it to the bathroom. Pt has to apologize to store owners when she leaked to the floor.  Wears  4 Depend diapers during the day and 2-5 x  night.   Nocturia 2-5 x night.  Leakage started after menopause   2)B radiating LBP ( mostly radiating on LLE) :  At worst 8/10.  Has to lean on dishwasher/ counter when doing dishes due to pain.  Pain also occurs with sweeping , mopping , folding clothes   pt had LBP for 25 years , worsened after having her 2nd child. Radiates to back of back of calf.    Pt with oxygen portable machine.  PERTINENT HISTORY:  COPD             B lymphedema             2 vaginal deliveries with episiotomies, stitches              Scoliosis             Gallbladder removed   PAIN:  Are you having pain? Yes: see above   PRECAUTIONS:  no lifting greater 10 lb due to back issues   WEIGHT BEARING RESTRICTIONS: No  FALLS:  Has patient fallen in last 6 months? Yes, tripped over cinder block , and incurred scapes , no bruises. Only agitated her back   LIVING ENVIRONMENT: Lives with: alone sometimes stays with son  Lives in: mobile home  Stairs: 4 STE with rail  Has following equipment at home: rollator   OCCUPATION:  diasbled, hobbies crochet, puzzles, fishing   PLOF: IND    PATIENT GOALS:  Not have to carry a bag with clothes to change into and diapers and to wean from diapers to pads    OBJECTIVE:   [facility] PT  Assessment - 07/29/23 1411       Ambulation/Gait   Gait Comments longer stride, more anterior COM,  more hip flexion/ push off             [facility] Adult PT Treatment/Exercise - 07/29/23 1411       Therapeutic Activites    Other Therapeutic Activities used visual cues for proper breathing      Neuro Re-ed    Neuro Re-ed Details  cued for scapulothoracic strengthening with green band , cued for more cervico/sacpular retraction      Exercises   Exercises --   see pt instructions, reviewed deep core level 1-2                HOME EXERCISE PROGRAM: See pt instruction section    ASSESSMENT:  CLINICAL IMPRESSION:  Improvements:   Good carry over with Deep core Level 1-2 upon review today with minimal cues     Session was abbreviated today due to pt's late arrival.     Initiated cervico-scapular strengthening with green band with cues for proper technique. Pt demo'd improved technique post Tx.      Pt showed improved longer stride, hip flexion, push off today which will help with pt's mobility and endurance will help with her getting to bathroom in public places before leakage.    Anticipate pt's motivation and progress will  help promote optimize IAP system for improved pelvic floor function, trunk stability, gait, balance, stabilization with  mobility tasks.  Plan to address pelvic floor issues once pelvis and spine are realigned to yield better outcomes.      OBJECTIVE IMPAIRMENTS decreased activity tolerance, decreased coordination, decreased endurance, decreased mobility, difficulty walking, decreased ROM, decreased strength, decreased safety awareness, hypomobility, increased muscle spasms, impaired flexibility, improper body mechanics, postural dysfunction, and pain. scar restrictions   ACTIVITY LIMITATIONS  self-care,  sleep, home chores, work tasks    PARTICIPATION LIMITATIONS:  community, gym activities    PERSONAL FACTORS        are also affecting  patient's functional outcome.    REHAB POTENTIAL: Good   CLINICAL DECISION MAKING: Evolving/moderate complexity   EVALUATION COMPLEXITY: Moderate    PATIENT EDUCATION:    Education details: Showed pt anatomy images. Explained muscles attachments/ connection, physiology of deep core system/ spinal- thoracic-pelvis-lower kinetic chain as they relate to pt's presentation, Sx, and past Hx. Explained what and how these areas of deficits need to be restored to balance and function    See Therapeutic activity / neuromuscular re-education section  Answered pt's questions.   Person educated: Patient Education method: Explanation, Demonstration, Tactile cues, Verbal cues, and Handouts Education comprehension: verbalized understanding, returned demonstration, verbal cues required, tactile cues required, and needs further education     PLAN: PT FREQUENCY: 1x/week   PT DURATION: 10 weeks   PLANNED INTERVENTIONS: Therapeutic exercises, Therapeutic activity, Neuromuscular re-education, Balance training, Gait training, Patient/Family education, Self Care, Joint mobilization, Spinal mobilization, Moist heat, Taping, and Manual therapy, dry needling.   PLAN FOR NEXT SESSION: See clinical impression for plan     GOALS: Goals reviewed with patient? Yes  SHORT TERM GOALS: Target date: 07/23/2023    Pt will demo IND with HEP                    Baseline: Not IND            Goal status: INITIAL   LONG TERM GOALS: Target date: 09/03/2023    1.Pt will demo proper deep core coordination without chest breathing and optimal excursion of diaphragm/pelvic floor in order to promote spinal stability and pelvic floor function  Baseline: dyscoordination Goal status: INITIAL  2.  Pt will demo > 5 pt change on FOTO  to improve QOL and function  PFDI Urinary baseline - 71 Lower score = better function   Pelvic Pain baseline - 71 Lower score = better function  Bowel  constipation baseline -    Higher score = better function  PFDI Bowel - 13 Higher score = better function  Lumber baseline  - 39 Higher score = better function   Goal status: INITIAL  3.  Pt will demo proper body mechanics in against gravity tasks and ADLs  work tasks, fitness  to minimize straining pelvic floor / back    Baseline: not IND, improper form that places strain on pelvic floor  Goal status: INITIAL    4. Pt will demo increased gait speed > 1.3 m/s with reciprocal gait pattern, longer stride length  in order to ambulate safely in community and return to fitness routine  Baseline: 0.8 m/s , decreased R stance, R trunk lean , carrying portable oxgen tank   Goal status: INITIAL    5. Pt will report decreased diapers < 3 diapers during the day  and night in order improve hygiene and QOL  Baseline:  Wears  4 Depend diapers during the day and 2-5 x  night.   Goal status: INITIAL  6. Pt will report nocturia < 3 x night  to improve sleep quality  Baseline: nocturia 2-5 x night  Goal status: INITIAL  7. Pt will report no longer having to lean against dishwasher when doing dishes  Baseline:Has to lean on dishwasher/ counter when doing dishes, sweeping , mopping , folding clothes  Goal:  INITIAL   8. Pt will demo levelled pelvic girdle and shoulder height in order to progress to deep core strengthening HEP and restore mobility at spine, pelvis, gait, posture minimize falls, and improve balance   Baseline: R shoulder, iliac crest lowered  Goal:  MET (  shoe lift in R toe box and heel provided on eval, demo'd levelled girdle and spine)     Pia Lupe Plump, PT 07/29/2023, 1:46 PM

## 2023-08-05 ENCOUNTER — Ambulatory Visit: Payer: 59 | Admitting: Physical Therapy

## 2023-08-05 DIAGNOSIS — M533 Sacrococcygeal disorders, not elsewhere classified: Secondary | ICD-10-CM

## 2023-08-05 DIAGNOSIS — M4125 Other idiopathic scoliosis, thoracolumbar region: Secondary | ICD-10-CM

## 2023-08-05 DIAGNOSIS — R2689 Other abnormalities of gait and mobility: Secondary | ICD-10-CM

## 2023-08-05 NOTE — Patient Instructions (Signed)
Lymphedema pump   walking in house 15 min x 2 day   stretches:   Lunge positon L foot back, L arm up  Glide   Twist   Step front foot against wall for calf stretch Knee bents for both legs     Seated figure on the edge of bed, other leg back like a lunge               3- foot tap  10 reps  Each side    Hold onto wall    Slightly bend of standing knee, and keep hips above foot    ballmound of opposite leg   taps to each direction and    back to spot under hips- notice equal pressure through both legs, and across ballmound and heels    ___            deep core level 1-2           Place band in "U"    band under ballmounds  while laying on back w/ knees bent   Lying on back, knees bent    band under ballmounds  while laying on back w/ knees bent   "W" exercise  10 reps x 2 sets    Band is placed under feet, knees bent, feet are hip width apart Hold band with thumbs point out, keep upper arm and elbow touching the bed the whole time  - inhale and then exhale pull bands by bending elbows hands move in a "w"  (feel shoulder blades squeezing)

## 2023-08-05 NOTE — Therapy (Signed)
OUTPATIENT PHYSICAL THERAPY TREATMENT     Patient Name: Bonnie Gomez MRN: [id number] DOB:[birth year], 58 y.o., female Today's Date: 08/05/2023   PT End of Session - 08/05/23 1338     Visit Number 6    Number of Visits 10    Date for PT Re-Evaluation 09/03/23    PT Start Time 1334    PT Stop Time 1415    PT Time Calculation (min) 41 min    Activity Tolerance Patient tolerated treatment well;No increased pain    Behavior During Therapy [facility] for tasks assessed/performed             Past Medical History:  Diagnosis Date   Allergy    Anxiety    Arthritis    Asthma 2001   COPD (chronic obstructive pulmonary disease) (HCC) 2013   Depression    Emphysema lung (HCC)    Heart abnormality    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   Hiatal hernia    Hypertension    Insomnia    Overactive bladder    Pneumonia    2013   Thyroid disease    Vitamin D deficiency    Past Surgical History:  Procedure Laterality Date   ANGIOPLASTY  2008, 2012   CHOLECYSTECTOMY  1985   COLONOSCOPY  2013   GYNECOLOGIC CRYOSURGERY  1993   SKIN BIOPSY  2006   TUBAL LIGATION  1992   UPPER GI ENDOSCOPY  2013   [place]itt   Patient Active Problem List   Diagnosis Date Noted   Venous (peripheral) insufficiency 07/03/2022   Cellulitis and abscess of leg, except foot 07/03/2022   Lymphedema 06/17/2021   Swelling of limb 03/04/2021   Essential hypertension 03/04/2021   Pain in limb 03/04/2021   Neck pain 11/17/2017   Post-menopausal 05/03/2017   COPD exacerbation (HCC) 02/29/2016   Condyloma acuminatum in female 04/29/2015   Perineal mass in female 04/11/2015   Anxiety 12/05/2013   Sleep apnea 12/05/2013   Migraine 12/05/2013    PCP: Clint Guy   REFERRING PROVIDER: MacDiarmid   REFERRING DIAG:  Mixed Incontinence  Rationale for Evaluation and Treatment Rehabilitation  THERAPY DIAG:  Other abnormalities of gait and mobility  Other idiopathic scoliosis, thoracolumbar region  Sacrococcygeal  disorders, not elsewhere classified  ONSET DATE:   SUBJECTIVE:                                                                                                                                                                                            SUBJECTIVE STATEMENT TODAY:   Pt reports she is decreasing her pads   Still  has to  lean when doing dishes   Using her oxygen machine less    SUBJECTIVE STATEMENT ON EVAL 06/25/23: 1) urinary leakage occurs coughing, sit to stand,  and also leakage occurs before making it to the bathroom. Pt has to apologize to store owners when she leaked to the floor.  Wears  4 Depend diapers during the day and 2-5 x  night.   Nocturia 2-5 x night.  Leakage started after menopause   2)B radiating LBP ( mostly radiating on LLE) :  At worst 8/10.  Has to lean on dishwasher/ counter when doing dishes due to pain.  Pain also occurs with sweeping , mopping , folding clothes   pt had LBP for 25 years , worsened after having her 2nd child. Radiates to back of back of calf.    Pt with oxygen portable machine.  PERTINENT HISTORY:  COPD             B lymphedema             2 vaginal deliveries with episiotomies, stitches              Scoliosis             Gallbladder removed   PAIN:  Are you having pain? Yes: see above   PRECAUTIONS:  no lifting greater 10 lb due to back issues   WEIGHT BEARING RESTRICTIONS: No  FALLS:  Has patient fallen in last 6 months? Yes, tripped over cinder block , and incurred scapes , no bruises. Only agitated her back   LIVING ENVIRONMENT: Lives with: alone sometimes stays with son  Lives in: mobile home  Stairs: 4 STE with rail  Has following equipment at home: rollator   OCCUPATION:  diasbled, hobbies crochet, puzzles, fishing   PLOF: IND    PATIENT GOALS:  Not have to carry a bag with clothes to change into and diapers and to wean from diapers to pads    OBJECTIVE:      [facility] PT Assessment - 08/05/23 1418        6 minute walk test results    Endurance additional comments 1045 ft with reciprocal pole exercise with therapist,  2/10 BORG             [facility] Adult PT Treatment/Exercise - 08/05/23 1418       Ambulation/Gait   Gait Comments reciprocal pole exercise for gait training to improve thoracic rotation/   armswings      Therapeutic Activites    Other Therapeutic Activities reassessed goals      Neuro Re-ed    Neuro Re-ed Details  excessive cues for armswing and less forward head with gait                 HOME EXERCISE PROGRAM: See pt instruction section    ASSESSMENT:  CLINICAL IMPRESSION:  Improvements:  Achieved goal with decreased pads :  4 Depend diapers during the day and 2-5 x  night to                     2-3 diapers during the day, 1 x at night  Pt is using her supplemental oxygen machine less.     Pt showed improved longer stride, hip flexion, push off but showed minimal armswings.    Reassessed goals. Focused on gait training, provided  excessive cues for armswing and less forward head with gait .  Pt continues to improve mobility and endurance which will help  with getting to  the toilet with continence.   Anticipate pt's motivation and progress will  help promote optimize IAP system for improved pelvic floor function, trunk stability, gait, balance, stabilization with mobility tasks.  Plan to address pelvic floor issues  at next session   Pt benefits from skilled PT   OBJECTIVE IMPAIRMENTS decreased activity tolerance, decreased coordination, decreased endurance, decreased mobility, difficulty walking, decreased ROM, decreased strength, decreased safety awareness, hypomobility, increased muscle spasms, impaired flexibility, improper body mechanics, postural dysfunction, and pain. scar restrictions   ACTIVITY LIMITATIONS  self-care,  sleep, home chores, work tasks    PARTICIPATION LIMITATIONS:  community, gym activities    PERSONAL FACTORS        are  also affecting patient's functional outcome.    REHAB POTENTIAL: Good   CLINICAL DECISION MAKING: Evolving/moderate complexity   EVALUATION COMPLEXITY: Moderate    PATIENT EDUCATION:    Education details: Showed pt anatomy images. Explained muscles attachments/ connection, physiology of deep core system/ spinal- thoracic-pelvis-lower kinetic chain as they relate to pt's presentation, Sx, and past Hx. Explained what and how these areas of deficits need to be restored to balance and function    See Therapeutic activity / neuromuscular re-education section  Answered pt's questions.   Person educated: Patient Education method: Explanation, Demonstration, Tactile cues, Verbal cues, and Handouts Education comprehension: verbalized understanding, returned demonstration, verbal cues required, tactile cues required, and needs further education     PLAN: PT FREQUENCY: 1x/week   PT DURATION: 10 weeks   PLANNED INTERVENTIONS: Therapeutic exercises, Therapeutic activity, Neuromuscular re-education, Balance training, Gait training, Patient/Family education, Self Care, Joint mobilization, Spinal mobilization, Moist heat, Taping, and Manual therapy, dry needling.   PLAN FOR NEXT SESSION: See clinical impression for plan     GOALS: Goals reviewed with patient? Yes  SHORT TERM GOALS: Target date: 07/23/2023    Pt will demo IND with HEP                    Baseline: Not IND            Goal status: INITIAL   LONG TERM GOALS: Target date: 09/03/2023    1.Pt will demo proper deep core coordination without chest breathing and optimal excursion of diaphragm/pelvic floor in order to promote spinal stability and pelvic floor function  Baseline: dyscoordination Goal status: MET   2.  Pt will demo > 5 pt change on FOTO  to improve QOL and function  PFDI Urinary baseline - 71 Lower score = better function   Pelvic Pain baseline - 71 Lower score = better function  Bowel  constipation  baseline -   Higher score = better function  PFDI Bowel - 13 Higher score = better function  Lumber baseline  - 39 Higher score = better function   Goal status: INITIAL  3.  Pt will demo proper body mechanics in against gravity tasks and ADLs  work tasks, fitness  to minimize straining pelvic floor / back    Baseline: not IND, improper form that places strain on pelvic floor  Goal status: Ongoing     4. Pt will demo increased gait speed > 1.3 m/s with reciprocal gait pattern, longer stride length  in order to ambulate safely in community and return to fitness routine  Baseline: 0.8 m/s , decreased R stance, R trunk lean , carrying portable oxgen tank   Goal status: partially met ( 08/05/23: Visit 6 __1.18 m/s, but still required cues of  armswing )    5. Pt will report decreased diapers < 3 diapers during the day  and night in order improve hygiene and QOL  Baseline:  Wears  4 Depend diapers during the day and 2-5 x  night.   Goal status: MET ( 08/05/23 Visit 6 ___  2-3 diapers during the day, 1 x at night )   6. Pt will report nocturia < 3 x night  to improve sleep quality  Baseline: nocturia 2-5 x night  Goal status:  Ongoing   7. Pt will report no longer having to lean against dishwasher when doing dishes  Baseline:Has to lean on dishwasher/ counter when doing dishes, sweeping , mopping , folding clothes  Goal:  Ongoing   8. Pt will demo levelled pelvic girdle and shoulder height in order to progress to deep core strengthening HEP and restore mobility at spine, pelvis, gait, posture minimize falls, and improve balance ' Baseline: R shoulder/ iliac crest lowered  Goal:  MET   (  shoe lift in R toe box and heel provided on eval, demo'd levelled girdle and spine)     Mariane Masters, PT 08/05/2023, 1:39 PM

## 2023-08-12 ENCOUNTER — Ambulatory Visit: Payer: 59 | Admitting: Physical Therapy

## 2023-08-12 DIAGNOSIS — M533 Sacrococcygeal disorders, not elsewhere classified: Secondary | ICD-10-CM

## 2023-08-12 DIAGNOSIS — R2689 Other abnormalities of gait and mobility: Secondary | ICD-10-CM

## 2023-08-12 DIAGNOSIS — M4125 Other idiopathic scoliosis, thoracolumbar region: Secondary | ICD-10-CM

## 2023-08-12 NOTE — Therapy (Signed)
OUTPATIENT PHYSICAL THERAPY TREATMENT     Patient Name: Bonnie Gomez MRN: [id number] DOB:10/13/1965, 58 y.o., female Today's Date: 08/12/2023   PT End of Session - 08/12/23 1347     Visit Number 7    Number of Visits 10    Date for PT Re-Evaluation 09/03/23    PT Start Time 1336    PT Stop Time 1415    PT Time Calculation (min) 39 min    Activity Tolerance Patient tolerated treatment well;No increased pain    Behavior During Therapy [facility] for tasks assessed/performed             Past Medical History:  Diagnosis Date   Allergy    Anxiety    Arthritis    Asthma 2001   COPD (chronic obstructive pulmonary disease) (HCC) 2013   Depression    Emphysema lung (HCC)    Heart abnormality    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   Hiatal hernia    Hypertension    Insomnia    Overactive bladder    Pneumonia    2013   Thyroid disease    Vitamin D deficiency    Past Surgical History:  Procedure Laterality Date   ANGIOPLASTY  2008, 2012   CHOLECYSTECTOMY  1985   COLONOSCOPY  2013   GYNECOLOGIC CRYOSURGERY  1993   SKIN BIOPSY  2006   TUBAL LIGATION  1992   UPPER GI ENDOSCOPY  2013   [place]   Patient Active Problem List   Diagnosis Date Noted   Venous (peripheral) insufficiency 07/03/2022   Cellulitis and abscess of leg, except foot 07/03/2022   Lymphedema 06/17/2021   Swelling of limb 03/04/2021   Essential hypertension 03/04/2021   Pain in limb 03/04/2021   Neck pain 11/17/2017   Post-menopausal 05/03/2017   COPD exacerbation (HCC) 02/29/2016   Condyloma acuminatum in female 04/29/2015   Perineal mass in female 04/11/2015   Anxiety 12/05/2013   Sleep apnea 12/05/2013   Migraine 12/05/2013    PCP: Clint Guy   REFERRING PROVIDER: MacDiarmid   REFERRING DIAG:  Mixed Incontinence  Rationale for Evaluation and Treatment Rehabilitation  THERAPY DIAG:  Other abnormalities of gait and mobility  Sacrococcygeal disorders, not elsewhere classified  Other idiopathic  scoliosis, thoracolumbar region  ONSET DATE:   SUBJECTIVE:                                                                                                                                                                                            SUBJECTIVE STATEMENT TODAY:   Pt reports she woke up only one time last night to pee. "It  was wow! For me because I did not have to clean up after myself or change clothes or wipe the floor".    SUBJECTIVE STATEMENT ON EVAL 06/25/23: 1) urinary leakage occurs coughing, sit to stand,  and also leakage occurs before making it to the bathroom. Pt has to apologize to store owners when she leaked to the floor.  Wears  4 Depend diapers during the day and 2-5 x  night.   Nocturia 2-5 x night.  Leakage started after menopause   2)B radiating LBP ( mostly radiating on LLE) :  At worst 8/10.  Has to lean on dishwasher/ counter when doing dishes due to pain.  Pain also occurs with sweeping , mopping , folding clothes   pt had LBP for 25 years , worsened after having her 2nd child. Radiates to back of back of calf.    Pt with oxygen portable machine.  PERTINENT HISTORY:  COPD             B lymphedema             2 vaginal deliveries with episiotomies, stitches              Scoliosis             Gallbladder removed   PAIN:  Are you having pain? Yes: see above   PRECAUTIONS:  no lifting greater 10 lb due to back issues   WEIGHT BEARING RESTRICTIONS: No  FALLS:  Has patient fallen in last 6 months? Yes, tripped over cinder block , and incurred scapes , no bruises. Only agitated her back   LIVING ENVIRONMENT: Lives with: alone sometimes stays with son  Lives in: mobile home  Stairs: 4 STE with rail  Has following equipment at home: rollator   OCCUPATION:  diasbled, hobbies crochet, puzzles, fishing   PLOF: IND    PATIENT GOALS:  Not have to carry a bag with clothes to change into and diapers and to wean from diapers to pads    OBJECTIVE:      [facility] PT Assessment - 08/13/23 1302       Coordination   Coordination and Movement Description poor coordination between trunk and pelvis with multidifis HEP      Ambulation/Gait   Gait Comments good carryover with gait from last sesison, more upright posture.             [facility] Adult PT Treatment/Exercise - 08/13/23 1302       Therapeutic Activites    Other Therapeutic Activities created chart for pt per request to help sequence her lymphatic pump before PT HEP .      Neuro Re-ed    Neuro Re-ed Details  cued for multifidis strenghtening seated. cued for diassociation between trunk and pelvis      Exercises   Exercises Other Exercises    Other Exercises  see pt instructions ( multifidis HEP today )               HOME EXERCISE PROGRAM: See pt instruction section    ASSESSMENT:  CLINICAL IMPRESSION:  Improvements overall across the past sessions:  Achieved goal with decreased pads :  4 Depend diapers during the day and 2-5 x  night to                     2-3 diapers during the day, 1 x at night  Pt is using her supplemental oxygen machine less. Pt showed improved gaitspeed improved longer  stride, hip flexion, push off but showed minimal armswings  Improvements today:  Pt reports she woke up only one time last night to pee Pt made it toilet during the night without leakage for the first time . "It was wow! For me because I did not have to clean up after myself or change clothes or wipe the floor".   Added multifidis strengthening today.  Cued for diassociation between trunk and pelvis with new HEP. Created chart for pt per request to help sequence her lymphatic pump before PT HEP .  Anticipate pt's motivation and progress will  help promote optimize IAP system for improved pelvic floor function, trunk stability, gait, balance, stabilization with mobility tasks.  Plan to address pelvic floor issues  at next session   Pt benefits from skilled PT   OBJECTIVE  IMPAIRMENTS decreased activity tolerance, decreased coordination, decreased endurance, decreased mobility, difficulty walking, decreased ROM, decreased strength, decreased safety awareness, hypomobility, increased muscle spasms, impaired flexibility, improper body mechanics, postural dysfunction, and pain. scar restrictions   ACTIVITY LIMITATIONS  self-care,  sleep, home chores, work tasks    PARTICIPATION LIMITATIONS:  community, gym activities    PERSONAL FACTORS        are also affecting patient's functional outcome.    REHAB POTENTIAL: Good   CLINICAL DECISION MAKING: Evolving/moderate complexity   EVALUATION COMPLEXITY: Moderate    PATIENT EDUCATION:    Education details: Showed pt anatomy images. Explained muscles attachments/ connection, physiology of deep core system/ spinal- thoracic-pelvis-lower kinetic chain as they relate to pt's presentation, Sx, and past Hx. Explained what and how these areas of deficits need to be restored to balance and function    See Therapeutic activity / neuromuscular re-education section  Answered pt's questions.   Person educated: Patient Education method: Explanation, Demonstration, Tactile cues, Verbal cues, and Handouts Education comprehension: verbalized understanding, returned demonstration, verbal cues required, tactile cues required, and needs further education     PLAN: PT FREQUENCY: 1x/week   PT DURATION: 10 weeks   PLANNED INTERVENTIONS: Therapeutic exercises, Therapeutic activity, Neuromuscular re-education, Balance training, Gait training, Patient/Family education, Self Care, Joint mobilization, Spinal mobilization, Moist heat, Taping, and Manual therapy, dry needling.   PLAN FOR NEXT SESSION: See clinical impression for plan     GOALS: Goals reviewed with patient? Yes  SHORT TERM GOALS: Target date: 07/23/2023    Pt will demo IND with HEP                    Baseline: Not IND            Goal status: INITIAL   LONG TERM  GOALS: Target date: 09/03/2023    1.Pt will demo proper deep core coordination without chest breathing and optimal excursion of diaphragm/pelvic floor in order to promote spinal stability and pelvic floor function  Baseline: dyscoordination Goal status: MET   2.  Pt will demo > 5 pt change on FOTO  to improve QOL and function  PFDI Urinary baseline - 71 Lower score = better function   Pelvic Pain baseline - 71 Lower score = better function  Bowel  constipation baseline -   Higher score = better function  PFDI Bowel - 13 Higher score = better function  Lumber baseline  - 39 Higher score = better function   Goal status: INITIAL  3.  Pt will demo proper body mechanics in against gravity tasks and ADLs  work tasks, fitness  to minimize straining pelvic floor / back  Baseline: not IND, improper form that places strain on pelvic floor  Goal status: Ongoing     4. Pt will demo increased gait speed > 1.3 m/s with reciprocal gait pattern, longer stride length  in order to ambulate safely in community and return to fitness routine  Baseline: 0.8 m/s , decreased R stance, R trunk lean , carrying portable oxgen tank   Goal status: partially met ( 08/05/23: Visit 6 __1.18 m/s, but still required cues of armswing )    5. Pt will report decreased diapers < 3 diapers during the day  and night in order improve hygiene and QOL  Baseline:  Wears  4 Depend diapers during the day and 2-5 x  night.   Goal status: MET ( 08/05/23 Visit 6 ___  2-3 diapers during the day, 1 x at night )   6. Pt will report nocturia < 3 x night  to improve sleep quality  Baseline: nocturia 2-5 x night  Goal status:  Ongoing   7. Pt will report no longer having to lean against dishwasher when doing dishes  Baseline:Has to lean on dishwasher/ counter when doing dishes, sweeping , mopping , folding clothes  Goal:  Ongoing   8. Pt will demo levelled pelvic girdle and shoulder height in order to progress to deep  core strengthening HEP and restore mobility at spine, pelvis, gait, posture minimize falls, and improve balance ' Baseline: R shoulder/ iliac crest lowered  Goal:  MET   (  shoe lift in R toe box and heel provided on eval, demo'd levelled girdle and spine)     Mariane Masters, PT 08/12/2023, 1:48 PM

## 2023-08-12 NOTE — Patient Instructions (Addendum)
   Lymphedema pump   walking in house 15 min x 2 day   stretches:   Lunge positon L foot back, L arm up  Glide   Twist   Step front foot against wall for calf stretch Knee bents for both legs     Seated figure on the edge of bed, other leg back like a lunge               3- foot tap  10 reps  Each side    Hold onto wall    Slightly bend of standing knee, and keep hips above foot    ballmound of opposite leg   taps to each direction and    back to spot under hips- notice equal pressure through both legs, and across ballmound and heels    ___               Place band in "U"    band under ballmounds  while laying on back w/ knees bent   Lying on back, knees bent    band under ballmounds  while laying on back w/ knees bent   "W" exercise  10 reps x 2 sets    Band is placed under feet, knees bent, feet are hip width apart Hold band with thumbs point out, keep upper arm and elbow touching the bed the whole time  - inhale and then exhale pull bands by bending elbows hands move in a "w"  (feel shoulder blades squeezing)             deep core level 1-( 10 breaths)  Deep core level 2 (6 min with moving knee)             Multifidis twist   Band is on doorknob: sit facing perpendicular to door , sit halfway towards front of chair, firm through 4 points of contact at buttocks and feet. Feet are placed hip with apart.   Twisting trunk without moving the hips and knees Hold band at the level of ribcage, elbows bent,shoulder blades roll back and down like squeezing a pencil under armpit   Exhale twist,.10-15 deg away from door without moving your hips/ knees, press more weight on the side of the sitting bones/ foot opp of your direction of turn as your counterweight. Continue to maintain equal weight through legs.  Keep knee unlocked.  30 reps

## 2023-08-19 ENCOUNTER — Ambulatory Visit: Payer: 59 | Admitting: Physical Therapy

## 2023-08-19 ENCOUNTER — Telehealth: Payer: Self-pay | Admitting: Physical Therapy

## 2023-08-19 NOTE — Telephone Encounter (Signed)
Physical therapist left message  re: missed appt today.   We would be grateful if pt can please call to confirm whether they wish to keep or cancel remaining appts [phone].  Remaining appts will be removed after 2 no-show appts per rehab department policy.   If pt wants to schedule more appts, they will be scheduled one at a time.   Stated next appt 2/6@1 :30pm

## 2023-08-26 ENCOUNTER — Ambulatory Visit: Payer: 59 | Admitting: Physical Therapy

## 2023-08-31 ENCOUNTER — Encounter: Payer: Self-pay | Admitting: Internal Medicine

## 2023-09-02 ENCOUNTER — Ambulatory Visit: Payer: 59 | Attending: Urology | Admitting: Physical Therapy

## 2023-09-02 ENCOUNTER — Telehealth: Payer: Self-pay | Admitting: Physical Therapy

## 2023-09-02 NOTE — Telephone Encounter (Signed)
Physical therapist called pt re: missed appt today. Provided encouragement to keep up with HEP.   Left message that there is no charge to missed appts but there is a waiting list with other patients needing treatment at our clinic.    We would be grateful if pt can please call to confirm whether they wish to keep or cancel remaining appts [phone].  Remaining appts will be removed after 2 no-show appts per rehab department policy.   If pt wants to schedule more appts, they will be scheduled one at a time.

## 2023-09-09 ENCOUNTER — Ambulatory Visit: Payer: 59 | Admitting: Physical Therapy

## 2023-10-19 ENCOUNTER — Encounter: Payer: Self-pay | Admitting: Internal Medicine

## 2023-10-27 ENCOUNTER — Ambulatory Visit: Payer: Self-pay

## 2023-10-27 ENCOUNTER — Encounter
Admission: RE | Disposition: A | Discharge status: Home / Self Care | Payer: Self-pay | Source: Home / Self Care | Attending: Internal Medicine

## 2023-10-27 ENCOUNTER — Encounter: Payer: Self-pay | Admitting: Internal Medicine

## 2023-10-27 ENCOUNTER — Ambulatory Visit
Admission: RE | Admit: 2023-10-27 | Discharge: 2023-10-27 | Disposition: A | Discharge status: Home / Self Care | Payer: 59 | Attending: Internal Medicine | Admitting: Internal Medicine

## 2023-10-27 DIAGNOSIS — K2960 Other gastritis without bleeding: Secondary | ICD-10-CM | POA: Diagnosis not present

## 2023-10-27 DIAGNOSIS — J439 Emphysema, unspecified: Secondary | ICD-10-CM | POA: Diagnosis not present

## 2023-10-27 DIAGNOSIS — K31A11 Gastric intestinal metaplasia without dysplasia, involving the antrum: Secondary | ICD-10-CM | POA: Insufficient documentation

## 2023-10-27 DIAGNOSIS — K2100 Gastro-esophageal reflux disease with esophagitis, without bleeding: Secondary | ICD-10-CM | POA: Diagnosis not present

## 2023-10-27 DIAGNOSIS — I10 Essential (primary) hypertension: Secondary | ICD-10-CM | POA: Insufficient documentation

## 2023-10-27 DIAGNOSIS — R1314 Dysphagia, pharyngoesophageal phase: Secondary | ICD-10-CM | POA: Diagnosis present

## 2023-10-27 DIAGNOSIS — F17200 Nicotine dependence, unspecified, uncomplicated: Secondary | ICD-10-CM | POA: Insufficient documentation

## 2023-10-27 HISTORY — PX: ESOPHAGEAL DILATION: SHX303

## 2023-10-27 HISTORY — DX: Sleep apnea, unspecified: G47.30

## 2023-10-27 HISTORY — PX: ESOPHAGOGASTRODUODENOSCOPY (EGD) WITH PROPOFOL: SHX5813

## 2023-10-27 HISTORY — DX: Cervicalgia: M54.2

## 2023-10-27 HISTORY — PX: COLONOSCOPY WITH PROPOFOL: SHX5780

## 2023-10-27 HISTORY — DX: Headache, unspecified: R51.9

## 2023-10-27 SURGERY — COLONOSCOPY WITH PROPOFOL
Anesthesia: General

## 2023-10-27 MED ORDER — GLYCOPYRROLATE 0.2 MG/ML IJ SOLN
INTRAMUSCULAR | Status: DC | PRN
  Administered 2023-10-27: .2 mg via INTRAVENOUS

## 2023-10-27 MED ORDER — LIDOCAINE HCL (PF) 2 % IJ SOLN
INTRAMUSCULAR | Status: AC
  Filled 2023-10-27: qty 5

## 2023-10-27 MED ORDER — PROPOFOL 1000 MG/100ML IV EMUL
INTRAVENOUS | Status: AC
  Filled 2023-10-27: qty 100

## 2023-10-27 MED ORDER — PROPOFOL 500 MG/50ML IV EMUL
INTRAVENOUS | Status: DC | PRN
  Administered 2023-10-27: 100 ug/kg/min via INTRAVENOUS

## 2023-10-27 MED ORDER — GLYCOPYRROLATE 0.2 MG/ML IJ SOLN
INTRAMUSCULAR | Status: AC
  Filled 2023-10-27: qty 1

## 2023-10-27 MED ORDER — SODIUM CHLORIDE 0.9 % IV SOLN
INTRAVENOUS | Status: DC
  Administered 2023-10-27: 20 mL/h via INTRAVENOUS

## 2023-10-27 MED ORDER — PROPOFOL 10 MG/ML IV BOLUS
INTRAVENOUS | Status: DC | PRN
Start: 2023-10-27 — End: 2023-10-27
  Administered 2023-10-27: 100 mg via INTRAVENOUS

## 2023-10-27 MED ORDER — LIDOCAINE HCL (CARDIAC) PF 100 MG/5ML IV SOSY
PREFILLED_SYRINGE | INTRAVENOUS | Status: DC | PRN
  Administered 2023-10-27: 100 mg via INTRAVENOUS

## 2023-10-27 MED ORDER — PROPOFOL 10 MG/ML IV BOLUS
INTRAVENOUS | Status: AC
  Filled 2023-10-27: qty 20

## 2023-10-27 NOTE — Interval H&P Note (Signed)
 History and Physical Interval Note:  10/27/2023 8:27 AM  Bonnie Gomez  has presented today for surgery, with the diagnosis of R13.19 (ICD-10-CM) - Esophageal dysphagia R07.89 (ICD-10-CM) - Atypical chest pain K21.9 (ICD-10-CM) - Gastroesophageal reflux disease, unspecified whether esophagitis present Z12.11 (ICD-10-CM) - Colon cancer screening.  The various methods of treatment have been discussed with the patient and family. After consideration of risks, benefits and other options for treatment, the patient has consented to  Procedure(s): COLONOSCOPY WITH PROPOFOL (N/A) ESOPHAGOGASTRODUODENOSCOPY (EGD) WITH PROPOFOL (N/A) as a surgical intervention.  The patient's history has been reviewed, patient examined, no change in status, stable for surgery.  I have reviewed the patient's chart and labs.  Questions were answered to the patient's satisfaction.     [place], [place]

## 2023-10-27 NOTE — H&P (Signed)
 Outpatient short stay form Pre-procedure 10/27/2023 8:25 AM Daundre Biel K. Norma Fredrickson, M.D.  Primary Physician: Franco Nones, FNP  Reason for visit: Dysphagia, GERD, failed medical therapy, colon cancer screening, atypical chest pain  History of present illness:  Bonnie Gomez presents to the [facility] at the request of her PCP at [facility] for chief complaint of esophageal dysphagia, hiatal hernia, and refractory GERD. She was evaluated by me in the clinic back in 2020 and scheduled for EGD for indications of GERD and dysphagia, but cancelled due to concerns surrounding coronavirus pandemic. She is hoping to get the EGD scheduled. She continues to endorse issues with solid > liquid dysphagia localized to suprasternal notch and upper sternum. She feels like solid foods like meats, breads, rice, and potatoes are hanging up at this level and only passes if she follows with gulp of water or two. She does not have any food regurgitation. She can get strangled on a Pepsi or other really cold beverages. She is not having any issues with pills going down. She has been trying to take small bites and chewing up her food well. She reports GERD symptoms are still somewhat bothersome despite use of Dexilant 60 mg daily. She is not having any nocturnal symptoms. She denies any complaints of odynophagia, early satiety, hoarseness, epigastric abdominal pain, nausea, or vomiting. She has noticed atypical chest pain after eating and feels like it is not related to her heart. No changes in her bowel habits. She denies any hematochezia or melena. She is still using Colestid for chronic diarrhea and feels like this is well-managed. She is still smoking about 1 ppd. She drinks 3-4 Pepsi bottles (16.9 fluid oz) daily. She is on O2 if she leaves the house or is exerting herself. She follows in Pulmonology here at the clinic with Dr. Meredeth Ide. She is on chronic narcotics with fentanyl patch and oxycodone. She struggles  with chronic back pain and chronic neck pain. She also reports financial hardships and is requesting services if they are available.     Current Facility-Administered Medications:    0.9 %  sodium chloride infusion, , Intravenous, Continuous, [place], Boykin Nearing, MD, Last Rate: 20 mL/hr at 10/27/23 0745, 20 mL/hr at 10/27/23 0745  Medications Prior to Admission  Medication Sig Dispense Refill Last Dose/Taking   albuterol (PROVENTIL) (2.5 MG/3ML) 0.083% nebulizer solution INHALE 1 VIAL USING NEBULIZER EVERY SIX HOURS AS NEEDED   10/26/2023   aspirin EC 81 MG tablet Take 81 mg by mouth every morning.    10/26/2023   BD INTEGRA SYRINGE 25G X 1" 3 ML MISC USE WITH B12  5 10/26/2023   butalbital-acetaminophen-caffeine (FIORICET) 50-325-40 MG tablet Take 1 tablet by mouth every 4 (four) hours as needed for headache.   10/26/2023   cephALEXin (KEFLEX) 500 MG capsule Take 1 capsule (500 mg total) by mouth 3 (three) times daily. 21 capsule 0 10/26/2023   Cholecalciferol (VITAMIN D3) 5000 units CAPS Take 10,000 Units by mouth every morning.   10/26/2023   clonazePAM (KLONOPIN) 0.5 MG tablet Take 0.5 mg by mouth 2 (two) times daily as needed.   10/26/2023   colestipol (COLESTID) 1 g tablet    10/26/2023   DALIRESP 500 MCG TABS tablet Take 500 mcg by mouth every evening.   2 10/26/2023   dexlansoprazole (DEXILANT) 60 MG capsule Take 1 capsule by mouth daily as needed.   10/26/2023   DULoxetine (CYMBALTA) 60 MG capsule    10/26/2023   estradiol (ESTRACE)  0.5 MG tablet Take 1 tablet (0.5 mg total) by mouth daily. PATIENT WILL NEED AN APPOINTMENT BEFORE ANY FURTHER REFILLS GIVEN 30 tablet 0 10/26/2023   fentaNYL (DURAGESIC - DOSED MCG/HR) 50 MCG/HR 1 patch every other day.  0 10/26/2023   fentaNYL (DURAGESIC) 12 MCG/HR Place 1 patch onto the skin every other day. Pt taking 75 mcg   10/26/2023   FLUoxetine (PROZAC) 40 MG capsule    10/26/2023   Fluticasone Furoate (ARNUITY ELLIPTA) 100 MCG/ACT AEPB Inhale into the lungs.   10/26/2023    furosemide (LASIX) 40 MG tablet Take 40 mg by mouth daily as needed for fluid or edema.  5 10/26/2023   gabapentin (NEURONTIN) 100 MG capsule Take by mouth.   10/26/2023   ketoconazole (NIZORAL) 2 % cream APPLY A SMALL AMOUNT TOPICALLY TWO TIMES A DAY  2 10/26/2023   medroxyPROGESTERone (PROVERA) 2.5 MG tablet Take 1 tablet (2.5 mg total) by mouth daily. 30 tablet 2 10/26/2023   mirabegron ER (MYRBETRIQ) 50 MG TB24 tablet Take 1 tablet (50 mg total) by mouth daily. 90 tablet 3 10/26/2023   mometasone (ELOCON) 0.1 % cream Apply 1 application topically 2 (two) times daily.   10/26/2023   mometasone (NASONEX) 50 MCG/ACT nasal spray Place into the nose.   10/26/2023   nystatin (MYCOSTATIN/NYSTOP) powder APPLY SMALL AMOUNT TOPICALLY TWO TIMES A DAY AS NEEDED  2 10/26/2023   oxyCODONE-acetaminophen (PERCOCET) 10-325 MG tablet Take 1 tablet by mouth every 4 (four) hours as needed for pain.   10/26/2023   pantoprazole (PROTONIX) 40 MG tablet Take 40 mg by mouth daily.  2 10/26/2023   rizatriptan (MAXALT-MLT) 10 MG disintegrating tablet Take by mouth.   10/26/2023   rosuvastatin (CRESTOR) 20 MG tablet SMARTSIG:1 Tablet(s) By Mouth Every Evening   10/26/2023   STIOLTO RESPIMAT 2.5-2.5 MCG/ACT AERS SMARTSIG:2 Puff(s) Via Inhaler Daily   10/26/2023   terbinafine (LAMISIL) 250 MG tablet Take 250 mg by mouth daily.   10/26/2023   tiZANidine (ZANAFLEX) 4 MG tablet Take 4 mg by mouth 3 (three) times daily as needed.   10/26/2023   tolterodine (DETROL LA) 4 MG 24 hr capsule Take 1 capsule (4 mg total) by mouth daily. 90 capsule 3 10/26/2023   valACYclovir (VALTREX) 500 MG tablet Take 500 mg by mouth daily.  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    Physical Exam  Gen: Alert, oriented. Appears stated age.  HEENT: [place]/AT. PERRLA. Lungs: CTA, no wheezes. CV: RR nl S1, S2. Abd: soft, benign, no masses. BS+ Ext: No edema. Pulses 2+    Planned procedures: Proceed with EGD and colonoscopy.  The patient understands the nature of the planned procedure, indications, risks, alternatives and potential complications including but not limited to bleeding, infection, perforation, damage to internal organs and possible oversedation/side effects from anesthesia. The patient agrees and gives consent to proceed.  Please refer to procedure notes for findings, recommendations and patient disposition/instructions.     Bonnie Gomez K. Norma Fredrickson, M.D. Gastroenterology 10/27/2023  8:25 AM

## 2023-10-27 NOTE — Transfer of Care (Signed)
 Immediate Anesthesia Transfer of Care Note  Patient: Bonnie Gomez  Procedure(s) Performed: COLONOSCOPY WITH PROPOFOL ESOPHAGOGASTRODUODENOSCOPY (EGD) WITH PROPOFOL  Patient Location: PACU  Anesthesia Type:General  Level of Consciousness: awake, alert , and oriented  Airway & Oxygen Therapy: Patient Spontanous Breathing and Patient connected to nasal cannula oxygen  Post-op Assessment: Report given to RN and Post -op Vital signs reviewed and stable  Post vital signs: Reviewed and stable  Last Vitals:  Vitals Value Taken Time  BP 129/75 10/27/23 0849  Temp 35.7 C 10/27/23 0849  Pulse 100 10/27/23 0856  Resp 18 10/27/23 0856  SpO2 96 % 10/27/23 0856  Vitals shown include unfiled device data.  Last Pain:  Vitals:   10/27/23 0849  TempSrc: Temporal  PainSc: 0-No pain       Patent airway to PACU. Awake, responsive and comfortable. VSS.  Complications: No notable events documented.

## 2023-10-27 NOTE — Anesthesia Preprocedure Evaluation (Signed)
 Anesthesia Evaluation  Patient identified by MRN, date of birth, ID band Patient awake    Reviewed: Allergy & Precautions, H&P , NPO status , Patient's Chart, lab work & pertinent test results, reviewed documented beta blocker date and time   History of Anesthesia Complications Negative for: history of anesthetic complications  Airway Mallampati: IV  TM Distance: >3 FB Neck ROM: full    Dental  (+) Dental Advidsory Given, Partial Upper   Pulmonary shortness of breath and with exertion, asthma , sleep apnea and Oxygen sleep apnea , COPD, neg recent URI, Current Smoker   Pulmonary exam normal breath sounds clear to auscultation       Cardiovascular Exercise Tolerance: Good hypertension, (-) angina (-) Past MI and (-) Cardiac Stents Normal cardiovascular exam(-) dysrhythmias (-) Valvular Problems/Murmurs Rhythm:regular Rate:Normal     Neuro/Psych  PSYCHIATRIC DISORDERS Anxiety Depression    negative neurological ROS     GI/Hepatic Neg liver ROS, hiatal hernia,GERD  Medicated,,  Endo/Other  neg diabetesHypothyroidism    Renal/GU negative Renal ROS  negative genitourinary   Musculoskeletal   Abdominal   Peds  Hematology negative hematology ROS (+)   Anesthesia Other Findings Past Medical History: No date: Allergy No date: Anxiety No date: Arthritis 2001: Asthma No date: Cervicalgia 2013: COPD (chronic obstructive pulmonary disease) (HCC) No date: Depression No date: Emphysema lung (HCC) No date: Headache No date: Heart abnormality     Comment:  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 No date: Hiatal hernia No date: Hypertension No date: Insomnia No date: Overactive bladder No date: Pneumonia     Comment:  2013 No date: Sleep apnea No date: Thyroid disease No date: Vitamin D deficiency   Reproductive/Obstetrics negative OB ROS                             Anesthesia Physical Anesthesia  Plan  ASA: 3  Anesthesia Plan: General   Post-op Pain Management:    Induction: Intravenous  PONV Risk Score and Plan: 2 and Propofol infusion, TIVA and Treatment may vary due to age or medical condition  Airway Management Planned: Natural Airway and Nasal Cannula  Additional Equipment:   Intra-op Plan:   Post-operative Plan:   Informed Consent: I have reviewed the patients History and Physical, chart, labs and discussed the procedure including the risks, benefits and alternatives for the proposed anesthesia with the patient or authorized representative who has indicated his/her understanding and acceptance.     Dental Advisory Given  Plan Discussed with: Anesthesiologist, CRNA and Surgeon  Anesthesia Plan Comments:         Anesthesia Quick Evaluation

## 2023-10-27 NOTE — Op Note (Signed)
 [facility] Gastroenterology Patient Name: Bonnie Gomez Procedure Date: 10/27/2023 7:13 AM MRN: [id number] Account #: [id number] Date of Birth: 03-Oct-1965 Admit Type: Outpatient Age: 58 Room: [facility] ENDO ROOM 2 Gender: Female Note Status: Finalized Instrument Name: Upper Endoscope [id number] Procedure:             Upper GI endoscopy Indications:           Esophageal dysphagia, Suspected esophageal reflux,                         Failure to respond to medical treatment Providers:             Boykin Nearing. Norma Fredrickson MD, MD Referring MD:          Fernand Parkins. Clint Guy (Referring MD) Medicines:             Propofol per Anesthesia Complications:         No immediate complications. Estimated blood loss:                         Minimal. Procedure:             Pre-Anesthesia Assessment:                        - The risks and benefits of the procedure and the                         sedation options and risks were discussed with the                         patient. All questions were answered and informed                         consent was obtained.                        - Patient identification and proposed procedure were                         verified prior to the procedure by the nurse. The                         procedure was verified in the procedure room.                        - ASA Grade Assessment: III - A patient with severe                         systemic disease.                        - After reviewing the risks and benefits, the patient                         was deemed in satisfactory condition to undergo the                         procedure.                        After obtaining  informed consent, the endoscope was                         passed under direct vision. Throughout the procedure,                         the patient's blood pressure, pulse, and oxygen                         saturations were monitored continuously. The Endoscope                          was introduced through the mouth, and advanced to the                         third part of duodenum. The upper GI endoscopy was                         accomplished without difficulty. The patient tolerated                         the procedure well. Findings:      LA Grade A (one or more mucosal breaks less than 5 mm, not extending       between tops of 2 mucosal folds) esophagitis with no bleeding was found       in the distal esophagus.      The Z-line was irregular and was found in the distal esophagus. Mucosa       was biopsied with a cold forceps for histology. One specimen bottle was       sent to pathology. Estimated blood loss was minimal.      The exam of the esophagus was otherwise normal.      Two biopsies were obtained in the upper third of the esophagus with cold       forceps for evaluation of eosinophilic esophagitis. Two biopsies were       obtained with cold forceps for evaluation of eosinophilic esophagitis in       the middle third of the esophagus.      No endoscopic abnormality was evident in the esophagus to explain the       patient's complaint of dysphagia. It was decided, however, to proceed       with dilation in the distal esophagus. The scope was withdrawn. Dilation       was performed with a Maloney dilator with mild resistance at 54 Fr.      A single 20 mm mucosal papule (nodule) with no bleeding and no stigmata       of recent bleeding was found in the gastric antrum. The nodule was Paris       classification mixed IIa + IIc (superficial elevation with central       depression). Biopsies were taken with a cold forceps for histology.       Estimated blood loss was minimal.      The cardia and gastric fundus were normal on retroflexion.      The exam of the stomach was otherwise normal.      The examined duodenum was normal. Impression:            - LA Grade A reflux esophagitis with no bleeding.                        -  Z-line irregular, in the distal  esophagus. Biopsied.                        - No endoscopic esophageal abnormality to explain                         patient's dysphagia. Esophagus dilated. Dilated.                        - A single mucosal papule (nodule) found in the                         stomach. Biopsied.                        - Normal examined duodenum.                        - Two biopsies were obtained in the upper third of the                         esophagus.                        - Biopsy performed in the middle third of the                         esophagus. Recommendation:        - Patient has a contact number available for                         emergencies. The signs and symptoms of potential                         delayed complications were discussed with the patient.                         Return to normal activities tomorrow. Written                         discharge instructions were provided to the patient.                        - Resume previous diet.                        - Continue present medications.                        - Await pathology results.                        - Monitor results to esophageal dilation                        - Cancel colonoscopy as patient passing semisolid                         stool on examination.                        - Perform a  colonoscopy at the next available                         appointment.                        - Patient had poor prep today. Procedure Code(s):     --- Professional ---                        [phone], Esophagogastroduodenoscopy, flexible,                         transoral; with biopsy, single or multiple                        43450, Dilation of esophagus, by unguided sound or                         bougie, single or multiple passes Diagnosis Code(s):     --- Professional ---                        R13.14, Dysphagia, pharyngoesophageal phase                        K31.89, Other diseases of stomach and duodenum                         K22.89, Other specified disease of esophagus                        K21.00, Gastro-esophageal reflux disease with                         esophagitis, without bleeding CPT copyright 2022 American Medical Association. All rights reserved. The codes documented in this report are preliminary and upon coder review may  be revised to meet current compliance requirements. Stanton Kidney MD, MD 10/27/2023 8:42:35 AM This report has been signed electronically. Number of Addenda: 0 Note Initiated On: 10/27/2023 7:13 AM Estimated Blood Loss:  Estimated blood loss was minimal.      [facility]

## 2023-10-28 ENCOUNTER — Encounter: Payer: Self-pay | Admitting: Internal Medicine

## 2023-10-28 LAB — SURGICAL PATHOLOGY

## 2023-10-29 NOTE — Anesthesia Postprocedure Evaluation (Signed)
 Anesthesia Post Note  Patient: Bonnie Gomez  Procedure(s) Performed: COLONOSCOPY WITH PROPOFOL ESOPHAGOGASTRODUODENOSCOPY (EGD) WITH PROPOFOL DILATION, ESOPHAGUS  Patient location during evaluation: Endoscopy Anesthesia Type: General Level of consciousness: awake and alert Pain management: pain level controlled Vital Signs Assessment: post-procedure vital signs reviewed and stable Respiratory status: spontaneous breathing, nonlabored ventilation, respiratory function stable and patient connected to nasal cannula oxygen Cardiovascular status: blood pressure returned to baseline and stable Postop Assessment: no apparent nausea or vomiting Anesthetic complications: no   No notable events documented.   Last Vitals:  Vitals:   10/27/23 0732 10/27/23 0849  BP: (!) 142/103 129/75  Pulse: 78   Resp: 20   Temp: (!) 36.1 C (!) 35.7 C  SpO2: 97%     Last Pain:  Vitals:   10/27/23 0859  TempSrc:   PainSc: 0-No pain                 Lenard Simmer

## 2023-11-03 ENCOUNTER — Ambulatory Visit (INDEPENDENT_AMBULATORY_CARE_PROVIDER_SITE_OTHER): Payer: 59 | Admitting: Nurse Practitioner

## 2023-11-03 ENCOUNTER — Encounter (INDEPENDENT_AMBULATORY_CARE_PROVIDER_SITE_OTHER): Payer: Self-pay | Admitting: Nurse Practitioner

## 2023-11-03 VITALS — BP 148/84 | HR 80 | Resp 18 | Ht 62.0 in | Wt 179.8 lb

## 2023-11-03 DIAGNOSIS — I10 Essential (primary) hypertension: Secondary | ICD-10-CM | POA: Diagnosis not present

## 2023-11-03 DIAGNOSIS — I890 Lymphedema, not elsewhere classified: Secondary | ICD-10-CM | POA: Diagnosis not present

## 2023-11-03 NOTE — Progress Notes (Incomplete)
 Subjective:    Patient ID: Bonnie Gomez, female    DOB: 07-Nov-1965, 58 y.o.   MRN: [id number] Chief Complaint  Patient presents with  . Follow-up    6 month no studies    HPI  Review of Systems     Objective:   Physical Exam  BP (!) 148/84   Pulse 80   Resp 18   Ht 5\' 2"  (1.575 m)   Wt 179 lb 12.8 oz (81.6 kg)   BMI 32.89 kg/m   Past Medical History:  Diagnosis Date  . Allergy   . Anxiety   . Arthritis   . Asthma 2001  . Cervicalgia   . COPD (chronic obstructive pulmonary disease) (HCC) 2013  . Depression   . Emphysema lung (HCC)   . Headache   . 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 Hiatal hernia   . Hypertension   . Insomnia   . Overactive bladder   . Pneumonia    2013  . Sleep apnea   . Thyroid disease   . Vitamin D deficiency     Social History   Socioeconomic History  . Marital status: Single    Spouse name: Not on file  . Number of children: Not on file  . Years of education: Not on file  . Highest education level: Not on file  Occupational History  . Not on file  Tobacco Use  . Smoking status: Every Day    Current packs/day: 1.00    Average packs/day: 1 pack/day for 35.0 years (35.0 ttl pk-yrs)    Types: Cigarettes    Passive exposure: Current  . Smokeless tobacco: Never  . Tobacco comments:    "not now "per pt  Vaping Use  . Vaping status: Some Days  Substance and Sexual Activity  . Alcohol use: No    Alcohol/week: 0.0 standard drinks of alcohol  . Drug use: Yes    Comment: prescribed 50mg  fentanyl patch & 10mg  oxy  . Sexual activity: Never  Other Topics Concern  . Not on file  Social History Narrative  . Not on file   Social Drivers of Health   Financial Resource Strain: High Risk (07/05/2023)   Received from [facility]   Overall Financial Resource Strain (CARDIA)   . Difficulty of Paying Living Expenses: Hard  Food Insecurity: Food Insecurity Present (07/05/2023)   Received from  [facility]   Hunger Vital Sign   . Worried About Programme researcher, broadcasting/film/video in the Last Year: Sometimes true   . Ran Out of Food in the Last Year: Sometimes true  Transportation Needs: Unmet Transportation Needs (07/05/2023)   Received from [facility]   [facility]   . In the past 12 months, has lack of transportation kept you from medical appointments or from getting medications?: Yes   . Lack of Transportation (Non-Medical): No  Physical Activity: Not on file  Stress: Not on file  Social Connections: Not on file  Intimate Partner Violence: Not on file    Past Surgical History:  Procedure Laterality Date  . ANGIOPLASTY  2008, 2012  . CARDIAC CATHETERIZATION    . CHOLECYSTECTOMY  1985  . COLONOSCOPY  2013  . COLONOSCOPY WITH PROPOFOL N/A 10/27/2023   Procedure: COLONOSCOPY WITH PROPOFOL;  Surgeon: Toledo, Boykin Nearing, MD;  Location: [facility] ENDOSCOPY;  Service: Gastroenterology;  Laterality: N/A;  . ESOPHAGEAL DILATION  10/27/2023   Procedure:  DILATION, ESOPHAGUS;  Surgeon: Toledo, Boykin Nearing, MD;  Location: [facility] ENDOSCOPY;  Service: Gastroenterology;;  . ESOPHAGOGASTRODUODENOSCOPY (EGD) WITH PROPOFOL N/A 10/27/2023   Procedure: ESOPHAGOGASTRODUODENOSCOPY (EGD) WITH PROPOFOL;  Surgeon: Toledo, Boykin Nearing, MD;  Location: [facility] ENDOSCOPY;  Service: Gastroenterology;  Laterality: N/A;  . GYNECOLOGIC CRYOSURGERY  1993  . SKIN BIOPSY  2006  . TUBAL LIGATION  1992  . UPPER GI ENDOSCOPY  2013   [place]    Family History  Problem Relation Age of Onset  . Cancer Mother        lung  . Cancer Father        lung  . Cancer Maternal Grandmother        breast  . Breast cancer Maternal Grandmother   . Scoliosis Brother   . Arthritis Brother   . Uterine cancer Paternal Grandmother   . Breast cancer Cousin   . Bladder Cancer Neg Hx   . Kidney cancer Neg Hx     Allergies  Allergen Reactions  . Duloxetine Other (See Comments)    Agitation  .  Varenicline Other (See Comments)    Agitation  . Duloxetine Hcl Anxiety    Agitation       Latest Ref Rng & Units 04/25/2021    3:39 AM 01/13/2021    6:13 PM 07/15/2017    3:09 PM  CBC  WBC 4.0 - 10.5 K/uL 10.1  7.9  7.4   Hemoglobin 12.0 - 15.0 g/dL 16.1  09.6  04.5   Hematocrit 36.0 - 46.0 % 42.2  44.0  44.7   Platelets 150 - 400 K/uL 225  221  212       CMP     Component Value Date/Time   NA 132 (L) 04/25/2021 0339   NA 142 06/17/2017 1513   NA 138 10/18/2012 1345   K 3.2 (L) 04/25/2021 0339   K 3.7 10/18/2012 1345   CL 94 (L) 04/25/2021 0339   CL 104 10/18/2012 1345   CO2 28 04/25/2021 0339   CO2 28 10/18/2012 1345   GLUCOSE 105 (H) 04/25/2021 0339   GLUCOSE 179 (H) 10/18/2012 1345   BUN 8 04/25/2021 0339   BUN 5 (L) 06/17/2017 1513   BUN 10 10/18/2012 1345   CREATININE 0.88 04/25/2021 0339   CREATININE 0.91 10/18/2012 1345   CALCIUM 8.3 (L) 04/25/2021 0339   CALCIUM 8.9 10/18/2012 1345   PROT 6.8 01/13/2021 1813   PROT 6.1 06/17/2017 1513   PROT 7.2 12/11/2011 1352   ALBUMIN 3.6 01/13/2021 1813   ALBUMIN 3.9 06/17/2017 1513   ALBUMIN 3.1 (L) 12/11/2011 1352   AST 20 01/13/2021 1813   AST 20 12/11/2011 1352   ALT 12 01/13/2021 1813   ALT 14 12/11/2011 1352   ALKPHOS 80 01/13/2021 1813   ALKPHOS 75 12/11/2011 1352   BILITOT 0.6 01/13/2021 1813   BILITOT 0.3 06/17/2017 1513   BILITOT 0.6 12/11/2011 1352   GFRNONAA >60 04/25/2021 0339   GFRNONAA >60 10/18/2012 1345     No results found.     Assessment & Plan:   1. Lymphedema (Primary) Recommend:  No surgery or intervention at this point in time.    I have reviewed my discussion with the patient regarding lymphedema and why it  causes symptoms.  Patient will continue wearing graduated compression on a daily basis. The patient should put the compression on first thing in the morning and removing them in the evening. The patient should not sleep in the compression.  In addition, behavioral  modification throughout the day will be continued.  This will include frequent elevation (such as in a recliner), use of over the counter pain medications as needed and exercise such as walking.  The systemic causes for chronic edema such as liver, kidney and cardiac etiologies does not appear to have significant changed over the past year.    The patient will continue aggressive use of the  lymph pump.  This will continue to improve the edema control and prevent sequela such as ulcers and infections.   The patient will follow-up in 6 months  2. Essential hypertension Continue antihypertensive medications as already ordered, these medications have been reviewed and there are no changes at this time.   Current Outpatient Medications on File Prior to Visit  Medication Sig Dispense Refill  . albuterol (PROVENTIL) (2.5 MG/3ML) 0.083% nebulizer solution INHALE 1 VIAL USING NEBULIZER EVERY SIX HOURS AS NEEDED    . aspirin EC 81 MG tablet Take 81 mg by mouth every morning.     . butalbital-acetaminophen-caffeine (FIORICET) 50-325-40 MG tablet Take 1 tablet by mouth every 4 (four) hours as needed for headache.    . cephALEXin (KEFLEX) 500 MG capsule Take 1 capsule (500 mg total) by mouth 3 (three) times daily. 21 capsule 0  . Cholecalciferol (VITAMIN D3) 5000 units CAPS Take 10,000 Units by mouth every morning.    . clonazePAM (KLONOPIN) 0.5 MG tablet Take 0.5 mg by mouth 2 (two) times daily as needed.    . colestipol (COLESTID) 1 g tablet     . cyanocobalamin (VITAMIN B12) 1000 MCG tablet Take by mouth.    Aaron Aas DALIRESP 500 MCG TABS tablet Take 500 mcg by mouth every evening.   2  . dexlansoprazole (DEXILANT) 60 MG capsule Take 1 capsule by mouth daily as needed.    . DULoxetine (CYMBALTA) 60 MG capsule     . estradiol (ESTRACE) 0.5 MG tablet Take 1 tablet (0.5 mg total) by mouth daily. PATIENT WILL NEED AN APPOINTMENT BEFORE ANY FURTHER REFILLS GIVEN 30 tablet 0  . fentaNYL (DURAGESIC) 75 MCG/HR 1  patch every other day.  0  . ferrous sulfate 325 (65 FE) MG EC tablet Take by mouth.    Aaron Aas FLUoxetine (PROZAC) 40 MG capsule     . fluticasone (FLOVENT HFA) 220 MCG/ACT inhaler Take by mouth.    . Fluticasone Furoate (ARNUITY ELLIPTA) 100 MCG/ACT AEPB Inhale into the lungs.    . furosemide (LASIX) 40 MG tablet Take 40 mg by mouth daily as needed for fluid or edema.  5  . gabapentin (NEURONTIN) 100 MG capsule Take by mouth.    Aaron Aas ketoconazole (NIZORAL) 2 % cream APPLY A SMALL AMOUNT TOPICALLY TWO TIMES A DAY  2  . losartan (COZAAR) 25 MG tablet Take 25 mg by mouth daily.    . medroxyPROGESTERone (PROVERA) 2.5 MG tablet Take 1 tablet (2.5 mg total) by mouth daily. 30 tablet 2  . mirabegron ER (MYRBETRIQ) 50 MG TB24 tablet Take 1 tablet (50 mg total) by mouth daily. 90 tablet 3  . mometasone (ELOCON) 0.1 % cream Apply 1 application topically 2 (two) times daily.    . mometasone (NASONEX) 50 MCG/ACT nasal spray Place into the nose.    . nystatin (MYCOSTATIN/NYSTOP) powder APPLY SMALL AMOUNT TOPICALLY TWO TIMES A DAY AS NEEDED  2  . oxyCODONE-acetaminophen (PERCOCET) 10-325 MG tablet Take 1 tablet by mouth every 4 (four) hours as needed for pain.    . potassium  chloride SA (KLOR-CON M) 20 MEQ tablet Take 20 mEq by mouth daily.    Aaron Aas QVAR REDIHALER 80 MCG/ACT inhaler SMARTSIG:2 Puff(s) By Mouth Twice Daily    . rizatriptan (MAXALT-MLT) 10 MG disintegrating tablet Take by mouth.    . rosuvastatin (CRESTOR) 20 MG tablet SMARTSIG:1 Tablet(s) By Mouth Every Evening    . STIOLTO RESPIMAT 2.5-2.5 MCG/ACT AERS SMARTSIG:2 Puff(s) Via Inhaler Daily    . terbinafine (LAMISIL) 250 MG tablet Take 250 mg by mouth daily.    Aaron Aas tiZANidine (ZANAFLEX) 4 MG tablet Take 4 mg by mouth 3 (three) times daily as needed.    . tolterodine (DETROL LA) 4 MG 24 hr capsule Take 1 capsule (4 mg total) by mouth daily. 90 capsule 3  . valACYclovir (VALTREX) 500 MG tablet Take 500 mg by mouth daily.  4  . BD INTEGRA SYRINGE 25G X 1" 3  ML MISC USE WITH B12  5  . fentaNYL (DURAGESIC) 12 MCG/HR Place 1 patch onto the skin every other day. Pt taking 75 mcg    . pantoprazole (PROTONIX) 40 MG tablet Take 40 mg by mouth daily.  2   No current facility-administered medications on file prior to visit.    There are no Patient Instructions on file for this visit. No follow-ups on file.   Adrienne Trombetta E Norina Cowper, NP

## 2023-11-03 NOTE — Progress Notes (Signed)
 Subjective:    Patient ID: Bonnie Gomez, female    DOB: 1965/12/04, 58 y.o.   MRN: [id number] Chief Complaint  Patient presents with   Follow-up    6 month no studies    The patient returns today for follow-up evaluation of her lymphedema.  She has received her lymphedema pump and utilizes it twice daily.  She wears compression but not consistently and notes that she needs more compression socks and that she could be better about wearing them more consistently.  She denies that she continues to have swelling but is not as bad as previous her legs to be very tight and painful.  Currently there are no open wounds or ulcerations.    Review of Systems  Cardiovascular:  Positive for leg swelling.  All other systems reviewed and are negative.      Objective:   Physical Exam Vitals reviewed.  HENT:     Head: Normocephalic.  Cardiovascular:     Rate and Rhythm: Normal rate.  Pulmonary:     Effort: Pulmonary effort is normal.  Musculoskeletal:     Right lower leg: Edema present.     Left lower leg: Edema present.  Skin:    General: Skin is warm and dry.  Neurological:     Mental Status: She is alert and oriented to person, place, and time.  Psychiatric:        Mood and Affect: Mood normal.        Behavior: Behavior normal.        Thought Content: Thought content normal.        Judgment: Judgment normal.     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   Hiatal hernia    Hypertension    Insomnia    Overactive bladder    Pneumonia    2013   Sleep apnea    Thyroid disease    Vitamin D deficiency     Social History   Socioeconomic  History   Marital status: Single    Spouse name: Not on file   Number of children: Not on file   Years of education: Not on file   Highest education level: Not on file  Occupational History   Not on file  Tobacco Use   Smoking status: Every Day    Current packs/day: 1.00    Average packs/day: 1 pack/day for 35.0 years (35.0 ttl pk-yrs)    Types: Cigarettes    Passive exposure: Current   Smokeless tobacco: Never   Tobacco comments:    "not now "per pt  Vaping Use   Vaping status: Some Days  Substance and Sexual Activity   Alcohol use: No    Alcohol/week: 0.0 standard drinks of alcohol   Drug use: Yes    Comment: prescribed 50mg  fentanyl patch & 10mg  oxy   Sexual activity: Never  Other Topics Concern   Not on file  Social History Narrative   Not on file  Social Drivers of Corporate investment banker Strain: High Risk (07/05/2023)   Received from [facility]   Overall Financial Resource Strain (CARDIA)    Difficulty of Paying Living Expenses: Hard  Food Insecurity: Food Insecurity Present (07/05/2023)   Received from [facility]   Hunger Vital Sign    Worried About Running Out of Food in the Last Year: Sometimes true    Ran Out of Food in the Last Year: Sometimes true  Transportation Needs: Unmet Transportation Needs (07/05/2023)   Received from [facility] - Transportation    In the past 12 months, has lack of transportation kept you from medical appointments or from getting medications?: Yes    Lack of Transportation (Non-Medical): No  Physical Activity: Not on file  Stress: Not on file  Social Connections: Not on file  Intimate Partner Violence: Not on file    Past Surgical History:  Procedure Laterality Date   ANGIOPLASTY  2008, 2012   CARDIAC CATHETERIZATION     CHOLECYSTECTOMY  1985   COLONOSCOPY  2013   COLONOSCOPY WITH PROPOFOL N/A 10/27/2023   Procedure: COLONOSCOPY WITH PROPOFOL;  Surgeon:  Toledo, Boykin Nearing, MD;  Location: [facility] ENDOSCOPY;  Service: Gastroenterology;  Laterality: N/A;   ESOPHAGEAL DILATION  10/27/2023   Procedure: DILATION, ESOPHAGUS;  Surgeon: Toledo, Boykin Nearing, MD;  Location: [facility] ENDOSCOPY;  Service: Gastroenterology;;   ESOPHAGOGASTRODUODENOSCOPY (EGD) WITH PROPOFOL N/A 10/27/2023   Procedure: ESOPHAGOGASTRODUODENOSCOPY (EGD) WITH PROPOFOL;  Surgeon: Toledo, Boykin Nearing, MD;  Location: [facility] ENDOSCOPY;  Service: Gastroenterology;  Laterality: N/A;   GYNECOLOGIC CRYOSURGERY  1993   SKIN BIOPSY  2006   TUBAL LIGATION  1992   UPPER GI ENDOSCOPY  2013   [place]itt    Family History  Problem Relation Age of Onset   Cancer Mother        lung   Cancer Father        lung   Cancer Maternal Grandmother        breast   Breast cancer Maternal Grandmother    Scoliosis Brother    Arthritis Brother    Uterine cancer Paternal Grandmother    Breast cancer Cousin    Bladder Cancer Neg Hx    Kidney cancer Neg Hx     Allergies  Allergen Reactions   Duloxetine Other (See Comments)    Agitation   Varenicline Other (See Comments)    Agitation   Duloxetine Hcl Anxiety    Agitation       Latest Ref Rng & Units 04/25/2021    3:39 AM 01/13/2021    6:13 PM 07/15/2017    3:09 PM  CBC  WBC 4.0 - 10.5 K/uL 10.1  7.9  7.4   Hemoglobin 12.0 - 15.0 g/dL 08.6  57.8  46.9   Hematocrit 36.0 - 46.0 % 42.2  44.0  44.7   Platelets 150 - 400 K/uL 225  221  212       CMP     Component Value Date/Time   NA 132 (L) 04/25/2021 0339   NA 142 06/17/2017 1513   NA 138 10/18/2012 1345   K 3.2 (L) 04/25/2021 0339   K 3.7 10/18/2012 1345   CL 94 (L) 04/25/2021 0339   CL 104 10/18/2012 1345   CO2 28 04/25/2021 0339   CO2 28 10/18/2012 1345   GLUCOSE 105 (H) 04/25/2021 0339   GLUCOSE 179 (H) 10/18/2012 1345   BUN 8 04/25/2021 0339  BUN 5 (L) 06/17/2017 1513   BUN 10 10/18/2012 1345   CREATININE 0.88 04/25/2021 0339   CREATININE 0.91 10/18/2012 1345   CALCIUM 8.3 (L)  04/25/2021 0339   CALCIUM 8.9 10/18/2012 1345   PROT 6.8 01/13/2021 1813   PROT 6.1 06/17/2017 1513   PROT 7.2 12/11/2011 1352   ALBUMIN 3.6 01/13/2021 1813   ALBUMIN 3.9 06/17/2017 1513   ALBUMIN 3.1 (L) 12/11/2011 1352   AST 20 01/13/2021 1813   AST 20 12/11/2011 1352   ALT 12 01/13/2021 1813   ALT 14 12/11/2011 1352   ALKPHOS 80 01/13/2021 1813   ALKPHOS 75 12/11/2011 1352   BILITOT 0.6 01/13/2021 1813   BILITOT 0.3 06/17/2017 1513   BILITOT 0.6 12/11/2011 1352   GFRNONAA >60 04/25/2021 0339   GFRNONAA >60 10/18/2012 1345     No results found.     Assessment & Plan:   1. Lymphedema (Primary) Recommend:  No surgery or intervention at this point in time.    I have reviewed my discussion with the patient regarding lymphedema and why it  causes symptoms.  Patient will continue wearing graduated compression on a daily basis. The patient should put the compression on first thing in the morning and removing them in the evening. The patient should not sleep in the compression.   In addition, behavioral modification throughout the day will be continued.  This will include frequent elevation (such as in a recliner), use of over the counter pain medications as needed and exercise such as walking.  The systemic causes for chronic edema such as liver, kidney and cardiac etiologies does not appear to have significant changed over the past year.    The patient will continue aggressive use of the  lymph pump.  This will continue to improve the edema control and prevent sequela such as ulcers and infections.   The patient will follow-up in 6 months  2. Essential hypertension Continue antihypertensive medications as already ordered, these medications have been reviewed and there are no changes at this time.   Current Outpatient Medications on File Prior to Visit  Medication Sig Dispense Refill   albuterol (PROVENTIL) (2.5 MG/3ML) 0.083% nebulizer solution INHALE 1 VIAL USING  NEBULIZER EVERY SIX HOURS AS NEEDED     aspirin EC 81 MG tablet Take 81 mg by mouth every morning.      butalbital-acetaminophen-caffeine (FIORICET) 50-325-40 MG tablet Take 1 tablet by mouth every 4 (four) hours as needed for headache.     cephALEXin (KEFLEX) 500 MG capsule Take 1 capsule (500 mg total) by mouth 3 (three) times daily. 21 capsule 0   Cholecalciferol (VITAMIN D3) 5000 units CAPS Take 10,000 Units by mouth every morning.     clonazePAM (KLONOPIN) 0.5 MG tablet Take 0.5 mg by mouth 2 (two) times daily as needed.     colestipol (COLESTID) 1 g tablet      cyanocobalamin (VITAMIN B12) 1000 MCG tablet Take by mouth.     DALIRESP 500 MCG TABS tablet Take 500 mcg by mouth every evening.   2   dexlansoprazole (DEXILANT) 60 MG capsule Take 1 capsule by mouth daily as needed.     DULoxetine (CYMBALTA) 60 MG capsule      estradiol (ESTRACE) 0.5 MG tablet Take 1 tablet (0.5 mg total) by mouth daily. PATIENT WILL NEED AN APPOINTMENT BEFORE ANY FURTHER REFILLS GIVEN 30 tablet 0   fentaNYL (DURAGESIC) 75 MCG/HR 1 patch every other day.  0   ferrous sulfate 325 (  65 FE) MG EC tablet Take by mouth.     FLUoxetine (PROZAC) 40 MG capsule      fluticasone (FLOVENT HFA) 220 MCG/ACT inhaler Take by mouth.     Fluticasone Furoate (ARNUITY ELLIPTA) 100 MCG/ACT AEPB Inhale into the lungs.     furosemide (LASIX) 40 MG tablet Take 40 mg by mouth daily as needed for fluid or edema.  5   gabapentin (NEURONTIN) 100 MG capsule Take by mouth.     ketoconazole (NIZORAL) 2 % cream APPLY A SMALL AMOUNT TOPICALLY TWO TIMES A DAY  2   losartan (COZAAR) 25 MG tablet Take 25 mg by mouth daily.     medroxyPROGESTERone (PROVERA) 2.5 MG tablet Take 1 tablet (2.5 mg total) by mouth daily. 30 tablet 2   mirabegron ER (MYRBETRIQ) 50 MG TB24 tablet Take 1 tablet (50 mg total) by mouth daily. 90 tablet 3   mometasone (ELOCON) 0.1 % cream Apply 1 application topically 2 (two) times daily.     mometasone (NASONEX) 50 MCG/ACT  nasal spray Place into the nose.     nystatin (MYCOSTATIN/NYSTOP) powder APPLY SMALL AMOUNT TOPICALLY TWO TIMES A DAY AS NEEDED  2   oxyCODONE-acetaminophen (PERCOCET) 10-325 MG tablet Take 1 tablet by mouth every 4 (four) hours as needed for pain.     potassium chloride SA (KLOR-CON M) 20 MEQ tablet Take 20 mEq by mouth daily.     QVAR REDIHALER 80 MCG/ACT inhaler SMARTSIG:2 Puff(s) By Mouth Twice Daily     rizatriptan (MAXALT-MLT) 10 MG disintegrating tablet Take by mouth.     rosuvastatin (CRESTOR) 20 MG tablet SMARTSIG:1 Tablet(s) By Mouth Every Evening     STIOLTO RESPIMAT 2.5-2.5 MCG/ACT AERS SMARTSIG:2 Puff(s) Via Inhaler Daily     terbinafine (LAMISIL) 250 MG tablet Take 250 mg by mouth daily.     tiZANidine (ZANAFLEX) 4 MG tablet Take 4 mg by mouth 3 (three) times daily as needed.     tolterodine (DETROL LA) 4 MG 24 hr capsule Take 1 capsule (4 mg total) by mouth daily. 90 capsule 3   valACYclovir (VALTREX) 500 MG tablet Take 500 mg by mouth daily.  4   BD INTEGRA SYRINGE 25G X 1" 3 ML MISC USE WITH B12  5   fentaNYL (DURAGESIC) 12 MCG/HR Place 1 patch onto the skin every other day. Pt taking 75 mcg     pantoprazole (PROTONIX) 40 MG tablet Take 40 mg by mouth daily.  2   No current facility-administered medications on file prior to visit.    There are no Patient Instructions on file for this visit. No follow-ups on file.   Nixxon Faria E Zelia Yzaguirre, NP

## 2023-11-24 ENCOUNTER — Encounter (HOSPITAL_COMMUNITY): Payer: Self-pay

## 2023-12-01 ENCOUNTER — Ambulatory Visit
Admission: RE | Admit: 2023-12-01 | Discharge: 2023-12-01 | Disposition: A | Discharge status: Home / Self Care | Attending: Internal Medicine | Admitting: Internal Medicine

## 2023-12-01 ENCOUNTER — Ambulatory Visit: Admitting: Certified Registered"

## 2023-12-01 ENCOUNTER — Other Ambulatory Visit: Payer: Self-pay

## 2023-12-01 ENCOUNTER — Encounter
Admission: RE | Disposition: A | Discharge status: Home / Self Care | Payer: Self-pay | Source: Home / Self Care | Attending: Internal Medicine

## 2023-12-01 ENCOUNTER — Encounter: Payer: Self-pay | Admitting: Internal Medicine

## 2023-12-01 DIAGNOSIS — J439 Emphysema, unspecified: Secondary | ICD-10-CM | POA: Diagnosis not present

## 2023-12-01 DIAGNOSIS — K219 Gastro-esophageal reflux disease without esophagitis: Secondary | ICD-10-CM | POA: Insufficient documentation

## 2023-12-01 DIAGNOSIS — F32A Depression, unspecified: Secondary | ICD-10-CM | POA: Diagnosis not present

## 2023-12-01 DIAGNOSIS — F419 Anxiety disorder, unspecified: Secondary | ICD-10-CM | POA: Diagnosis not present

## 2023-12-01 DIAGNOSIS — Z79899 Other long term (current) drug therapy: Secondary | ICD-10-CM | POA: Insufficient documentation

## 2023-12-01 DIAGNOSIS — G4730 Sleep apnea, unspecified: Secondary | ICD-10-CM | POA: Diagnosis not present

## 2023-12-01 DIAGNOSIS — K449 Diaphragmatic hernia without obstruction or gangrene: Secondary | ICD-10-CM | POA: Insufficient documentation

## 2023-12-01 DIAGNOSIS — D128 Benign neoplasm of rectum: Secondary | ICD-10-CM | POA: Diagnosis not present

## 2023-12-01 DIAGNOSIS — K635 Polyp of colon: Secondary | ICD-10-CM | POA: Insufficient documentation

## 2023-12-01 DIAGNOSIS — F17210 Nicotine dependence, cigarettes, uncomplicated: Secondary | ICD-10-CM | POA: Insufficient documentation

## 2023-12-01 DIAGNOSIS — R1314 Dysphagia, pharyngoesophageal phase: Secondary | ICD-10-CM | POA: Diagnosis not present

## 2023-12-01 DIAGNOSIS — K640 First degree hemorrhoids: Secondary | ICD-10-CM | POA: Insufficient documentation

## 2023-12-01 DIAGNOSIS — I10 Essential (primary) hypertension: Secondary | ICD-10-CM | POA: Insufficient documentation

## 2023-12-01 DIAGNOSIS — Z1211 Encounter for screening for malignant neoplasm of colon: Secondary | ICD-10-CM | POA: Diagnosis present

## 2023-12-01 HISTORY — PX: COLONOSCOPY: SHX5424

## 2023-12-01 SURGERY — COLONOSCOPY
Anesthesia: General

## 2023-12-01 MED ORDER — LIDOCAINE HCL (CARDIAC) PF 100 MG/5ML IV SOSY
PREFILLED_SYRINGE | INTRAVENOUS | Status: DC | PRN
  Administered 2023-12-01: 100 mg via INTRAVENOUS

## 2023-12-01 MED ORDER — SODIUM CHLORIDE 0.9 % IV SOLN: INTRAVENOUS | Status: DC

## 2023-12-01 MED ORDER — DEXMEDETOMIDINE HCL IN NACL 200 MCG/50ML IV SOLN
INTRAVENOUS | Status: DC | PRN
Start: 2023-12-01 — End: 2023-12-01
  Administered 2023-12-01: 8 ug via INTRAVENOUS

## 2023-12-01 MED ORDER — PROPOFOL 10 MG/ML IV BOLUS
INTRAVENOUS | Status: DC | PRN
  Administered 2023-12-01: 70 mg via INTRAVENOUS

## 2023-12-01 MED ORDER — GLYCOPYRROLATE 0.2 MG/ML IJ SOLN
INTRAMUSCULAR | Status: DC | PRN
  Administered 2023-12-01: .2 mg via INTRAVENOUS

## 2023-12-01 MED ORDER — PROPOFOL 500 MG/50ML IV EMUL
INTRAVENOUS | Status: DC | PRN
  Administered 2023-12-01: 165 ug/kg/min via INTRAVENOUS

## 2023-12-01 NOTE — Anesthesia Preprocedure Evaluation (Addendum)
 Anesthesia Evaluation  Patient identified by MRN, date of birth, ID band Patient awake    Reviewed: Allergy & Precautions, NPO status , Patient's Chart, lab work & pertinent test results  History of Anesthesia Complications Negative for: history of anesthetic complications  Airway Mallampati: III   Neck ROM: Full    Dental  (+) Missing   Pulmonary asthma , sleep apnea , COPD (2L home O2 at night and prn during day), Current Smoker (1/2 ppd)Patient did not abstain from smoking.   Pulmonary exam normal breath sounds clear to auscultation       Cardiovascular hypertension, Normal cardiovascular exam Rhythm:Regular Rate:Normal     Neuro/Psych  Headaches PSYCHIATRIC DISORDERS Anxiety Depression       GI/Hepatic hiatal hernia,GERD  ,,  Endo/Other  Obesity   Renal/GU negative Renal ROS     Musculoskeletal   Abdominal   Peds  Hematology negative hematology ROS (+)   Anesthesia Other Findings   Reproductive/Obstetrics                             Anesthesia Physical Anesthesia Plan  ASA: 3  Anesthesia Plan: General   Post-op Pain Management:    Induction: Intravenous  PONV Risk Score and Plan: 2 and Propofol  infusion, TIVA and Treatment may vary due to age or medical condition  Airway Management Planned: Natural Airway  Additional Equipment:   Intra-op Plan:   Post-operative Plan:   Informed Consent: I have reviewed the patients History and Physical, chart, labs and discussed the procedure including the risks, benefits and alternatives for the proposed anesthesia with the patient or authorized representative who has indicated his/her understanding and acceptance.       Plan Discussed with: CRNA  Anesthesia Plan Comments: (LMA/GETA backup discussed.  Patient consented for risks of anesthesia including but not limited to:  - adverse reactions to medications - damage to eyes,  teeth, lips or other oral mucosa - nerve damage due to positioning  - sore throat or hoarseness - damage to heart, brain, nerves, lungs, other parts of body or loss of life  Informed patient about role of CRNA in peri- and intra-operative care.  Patient voiced understanding.)       Anesthesia Quick Evaluation

## 2023-12-01 NOTE — Op Note (Signed)
 [facility] Gastroenterology Patient Name: Bonnie Gomez Procedure Date: 12/01/2023 10:31 AM MRN: [id number] Account #: [id number] Date of Birth: Nov 23, 1965 Admit Type: Outpatient Age: 58 Room: [facility] ENDO ROOM 2 Gender: Female Note Status: Finalized Instrument Name: Charlyn Cooley [id number] Procedure:             Colonoscopy Indications:           Screening for colorectal malignant neoplasm Providers:             Kekoa Fyock K. Angele Wiemann MD, MD Medicines:             Propofol  per Anesthesia Complications:         No immediate complications. Estimated blood loss: None. Procedure:             Pre-Anesthesia Assessment:                        - The risks and benefits of the procedure and the                         sedation options and risks were discussed with the                         patient. All questions were answered and informed                         consent was obtained.                        - Patient identification and proposed procedure were                         verified prior to the procedure by the nurse. The                         procedure was verified in the procedure room.                        - ASA Grade Assessment: III - A patient with severe                         systemic disease.                        - After reviewing the risks and benefits, the patient                         was deemed in satisfactory condition to undergo the                         procedure.                        After obtaining informed consent, the colonoscope was                         passed under direct vision. Throughout the procedure,                         the patient's blood pressure, pulse, and oxygen  saturations were monitored continuously. The                         Colonoscope was introduced through the anus and                         advanced to the the cecum, identified by appendiceal                         orifice and  ileocecal valve. The colonoscopy was                         performed without difficulty. The patient tolerated                         the procedure well. The quality of the bowel                         preparation was adequate. The ileocecal valve,                         appendiceal orifice, and rectum were photographed. Findings:      The perianal and digital rectal examinations were normal. Pertinent       negatives include normal sphincter tone and no palpable rectal lesions.      Non-bleeding internal hemorrhoids were found during retroflexion. The       hemorrhoids were Grade I (internal hemorrhoids that do not prolapse).      Two sessile polyps were found in the rectum. The polyps were 5 to 10 mm       in size. These polyps were removed with a hot snare. Resection and       retrieval were complete. Estimated blood loss: none.      The exam was otherwise without abnormality. Impression:            - Non-bleeding internal hemorrhoids.                        - Two 5 to 10 mm polyps in the rectum, removed with a                         hot snare. Resected and retrieved.                        - The examination was otherwise normal. Recommendation:        - Patient has a contact number available for                         emergencies. The signs and symptoms of potential                         delayed complications were discussed with the patient.                         Return to normal activities tomorrow. Written                         discharge instructions were provided to the patient.                        -  Resume previous diet.                        - Continue present medications.                        - Repeat colonoscopy is recommended for surveillance.                         The colonoscopy date will be determined after                         pathology results from today's exam become available                         for review.                        - Return to  GI office PRN.                        - The findings and recommendations were discussed with                         the patient. Procedure Code(s):     --- Professional ---                        [phone], Colonoscopy, flexible; with removal of                         tumor(s), polyp(s), or other lesion(s) by snare                         technique Diagnosis Code(s):     --- Professional ---                        K64.0, First degree hemorrhoids                        D12.8, Benign neoplasm of rectum                        Z12.11, Encounter for screening for malignant neoplasm                         of colon CPT copyright 2022 [employer]. All rights reserved. The codes documented in this report are preliminary and upon coder review may  be revised to meet current compliance requirements. Cassie Click MD, MD 12/01/2023 10:58:49 AM This report has been signed electronically. Number of Addenda: 0 Note Initiated On: 12/01/2023 10:31 AM Scope Withdrawal Time: 0 hours 6 minutes 37 seconds  Total Procedure Duration: 0 hours 15 minutes 40 seconds  Estimated Blood Loss:  Estimated blood loss: none.      [facility]

## 2023-12-01 NOTE — Interval H&P Note (Signed)
 History and Physical Interval Note:  12/01/2023 10:24 AM  Bonnie Gomez  has presented today for surgery, with the diagnosis of Colon cancer screening (Z12.11).  The various methods of treatment have been discussed with the patient and family. After consideration of risks, benefits and other options for treatment, the patient has consented to  Procedure(s): COLONOSCOPY (N/A) as a surgical intervention.  The patient's history has been reviewed, patient examined, no change in status, stable for surgery.  I have reviewed the patient's chart and labs.  Questions were answered to the patient's satisfaction.     [place], Zylan Almquist

## 2023-12-01 NOTE — Anesthesia Procedure Notes (Signed)
 Procedure Name: General with mask airway Date/Time: 12/01/2023 10:36 AM  Performed by: Niki Barter, CRNAPre-anesthesia Checklist: Patient identified, Emergency Drugs available, Suction available and Patient being monitored Patient Re-evaluated:Patient Re-evaluated prior to induction Oxygen Delivery Method: Simple face mask Induction Type: IV induction Placement Confirmation: positive ETCO2 and breath sounds checked- equal and bilateral Dental Injury: Teeth and Oropharynx as per pre-operative assessment

## 2023-12-01 NOTE — H&P (Signed)
 Outpatient short stay form Pre-procedure 12/01/2023 10:22 AM Georgene Kopper K. Corky Diener, M.D.  Primary Physician: Dysphagia, GERD, Colon cancer screening  Reason for visit:  Rueben Cote, FNP  History of present illness:  Esophageal dysphagia symptoms along with refractory GERD symptoms and atypical chest pain could be 2/2 esophageal stricture, Schatzki ring, esophageal motility disorder, EoE, esophageal or gastric neoplasm, erosive esophagitis, etc - Strict antireflux precautions advised along with dietary avoidance of trigges - Continue Dexilant 60 mg PO daily for now - Dysphagia precautions encouraged  - Advise EGD +/- dilatation and esophageal biopsies along with screening colonoscopy since she is overdue (last 2012) for screening. Procedures scheduled at [facility] with moderate sedation and Trilyte bowel prep next week 4/9.     Current Facility-Administered Medications:    0.9 %  sodium chloride  infusion, , Intravenous, Continuous, [place], Evania Lyne K, MD, Last Rate: 20 mL/hr at 12/01/23 1012, New Bag at 12/01/23 1012  Medications Prior to Admission  Medication Sig Dispense Refill Last Dose/Taking   aspirin  EC 81 MG tablet Take 81 mg by mouth every morning.    Past Week   Cholecalciferol (VITAMIN D3) 5000 units CAPS Take 10,000 Units by mouth every morning.   Past Month   clonazePAM  (KLONOPIN ) 0.5 MG tablet Take 0.5 mg by mouth 2 (two) times daily as needed.   11/30/2023   colestipol (COLESTID) 1 g tablet    11/30/2023   cyanocobalamin (VITAMIN B12) 1000 MCG tablet Take by mouth.   Past Week   DALIRESP 500 MCG TABS tablet Take 500 mcg by mouth every evening.   2 11/30/2023   dexlansoprazole (DEXILANT) 60 MG capsule Take 1 capsule by mouth daily as needed.   11/30/2023   fentaNYL  (DURAGESIC ) 12 MCG/HR Place 1 patch onto the skin every other day. Pt taking 75 mcg   11/30/2023   fentaNYL  (DURAGESIC ) 75 MCG/HR 1 patch every other day.  0 11/30/2023   ferrous sulfate 325 (65 FE) MG EC tablet Take by mouth.    Past Week   FLUoxetine (PROZAC) 40 MG capsule    11/30/2023   fluticasone  (FLOVENT HFA) 220 MCG/ACT inhaler Take by mouth.   11/30/2023   furosemide  (LASIX ) 40 MG tablet Take 40 mg by mouth daily as needed for fluid or edema.  5 Past Month   gabapentin  (NEURONTIN ) 100 MG capsule Take by mouth.   11/30/2023   losartan (COZAAR) 25 MG tablet Take 25 mg by mouth daily.   11/30/2023   medroxyPROGESTERone  (PROVERA ) 2.5 MG tablet Take 1 tablet (2.5 mg total) by mouth daily. 30 tablet 2 11/30/2023   mirabegron  ER (MYRBETRIQ ) 50 MG TB24 tablet Take 1 tablet (50 mg total) by mouth daily. 90 tablet 3 11/30/2023   mometasone (NASONEX) 50 MCG/ACT nasal spray Place into the nose.   11/30/2023   oxyCODONE -acetaminophen  (PERCOCET) 10-325 MG tablet Take 1 tablet by mouth every 4 (four) hours as needed for pain.   11/30/2023   potassium chloride SA (KLOR-CON M) 20 MEQ tablet Take 20 mEq by mouth daily.   Past Week   rosuvastatin (CRESTOR) 20 MG tablet SMARTSIG:1 Tablet(s) By Mouth Every Evening   11/30/2023   terbinafine (LAMISIL) 250 MG tablet Take 250 mg by mouth daily.   11/30/2023   tiZANidine (ZANAFLEX) 4 MG tablet Take 4 mg by mouth 3 (three) times daily as needed.   11/30/2023   tolterodine  (DETROL  LA) 4 MG 24 hr capsule Take 1 capsule (4 mg total) by mouth daily. 90 capsule 3 11/30/2023  valACYclovir (VALTREX) 500 MG tablet Take 500 mg by mouth daily.  4 11/30/2023   albuterol  (PROVENTIL ) (2.5 MG/3ML) 0.083% nebulizer solution INHALE 1 VIAL USING NEBULIZER EVERY SIX HOURS AS NEEDED      BD INTEGRA SYRINGE 25G X 1" 3 ML MISC USE WITH B12  5    butalbital-acetaminophen -caffeine (FIORICET) 50-325-40 MG tablet Take 1 tablet by mouth every 4 (four) hours as needed for headache.      cephALEXin  (KEFLEX ) 500 MG capsule Take 1 capsule (500 mg total) by mouth 3 (three) times daily. 21 capsule 0    DULoxetine (CYMBALTA) 60 MG capsule  (Patient not taking: Reported on 12/01/2023)   Completed Course   estradiol  (ESTRACE ) 0.5 MG  tablet Take 1 tablet (0.5 mg total) by mouth daily. PATIENT WILL NEED AN APPOINTMENT BEFORE ANY FURTHER REFILLS GIVEN 30 tablet 0    Fluticasone  Furoate (ARNUITY ELLIPTA ) 100 MCG/ACT AEPB Inhale into the lungs.      ketoconazole (NIZORAL) 2 % cream APPLY A SMALL AMOUNT TOPICALLY TWO TIMES A DAY  2    mometasone (ELOCON) 0.1 % cream Apply 1 application topically 2 (two) times daily.      nystatin (MYCOSTATIN/NYSTOP) powder APPLY SMALL AMOUNT TOPICALLY TWO TIMES A DAY AS NEEDED  2    pantoprazole (PROTONIX) 40 MG tablet Take 40 mg by mouth daily.  2    QVAR REDIHALER 80 MCG/ACT inhaler SMARTSIG:2 Puff(s) By Mouth Twice Daily      rizatriptan (MAXALT-MLT) 10 MG disintegrating tablet Take by mouth.      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    Physical Exam  Gen: Alert, oriented. Appears stated age.  HEENT: [place]/AT. PERRLA. Lungs: CTA, no wheezes. CV: RR nl S1, S2. Abd: soft, benign, no masses. BS+ Ext: No edema. Pulses 2+    Planned procedures: Proceed with EGD and colonoscopy. The patient understands the nature of the planned procedure, indications, risks, alternatives and potential complications including but not limited to bleeding, infection, perforation, damage to internal organs and possible oversedation/side  effects from anesthesia. The patient agrees and gives consent to proceed.  Please refer to procedure notes for findings, recommendations and patient disposition/instructions.     Shanon Becvar K. Corky Diener, M.D. Gastroenterology 12/01/2023  10:22 AM

## 2023-12-01 NOTE — Transfer of Care (Signed)
 Immediate Anesthesia Transfer of Care Note  Patient: Bonnie Gomez  Procedure(s) Performed: COLONOSCOPY  Patient Location: Endoscopy Unit  Anesthesia Type:General  Level of Consciousness: drowsy and patient cooperative  Airway & Oxygen Therapy: Patient Spontanous Breathing and Patient connected to face mask oxygen  Post-op Assessment: Report given to RN and Post -op Vital signs reviewed and stable  Post vital signs: Reviewed and stable  Last Vitals:  Vitals Value Taken Time  BP 77/50 12/01/23 1100  Temp    Pulse 66 12/01/23 1102  Resp 16 12/01/23 1102  SpO2 100 % 12/01/23 1102  Vitals shown include unfiled device data.  Last Pain:  Vitals:   12/01/23 1005  TempSrc: Temporal  PainSc: 0-No pain         Complications: No notable events documented.

## 2023-12-01 NOTE — Anesthesia Postprocedure Evaluation (Signed)
 Anesthesia Post Note  Patient: NYDA CHEESMAN  Procedure(s) Performed: COLONOSCOPY  Patient location during evaluation: PACU Anesthesia Type: General Level of consciousness: awake and alert, oriented and patient cooperative Pain management: pain level controlled Vital Signs Assessment: post-procedure vital signs reviewed and stable Respiratory status: spontaneous breathing, nonlabored ventilation and respiratory function stable Cardiovascular status: blood pressure returned to baseline and stable Postop Assessment: adequate PO intake Anesthetic complications: no   No notable events documented.   Last Vitals:  Vitals:   12/01/23 1110 12/01/23 1112  BP: (!) 70/31 (!) 90/52  Pulse: 79 81  Resp: 15 19  Temp:    SpO2: 95% 96%    Last Pain:  Vitals:   12/01/23 1112  TempSrc:   PainSc: 0-No pain                 Dorothey Gate

## 2023-12-02 ENCOUNTER — Encounter: Payer: Self-pay | Admitting: Internal Medicine

## 2023-12-02 LAB — SURGICAL PATHOLOGY

## 2023-12-07 ENCOUNTER — Encounter (INDEPENDENT_AMBULATORY_CARE_PROVIDER_SITE_OTHER): Payer: Self-pay

## 2024-01-31 ENCOUNTER — Encounter: Payer: Self-pay | Admitting: Urology

## 2024-01-31 ENCOUNTER — Telehealth: Payer: Self-pay

## 2024-01-31 NOTE — Telephone Encounter (Addendum)
 Called patient to get rescheduled no answer so left a voicemail to rescheduled because Macdiarmid will be out of office that afternoon

## 2024-02-14 ENCOUNTER — Ambulatory Visit: Payer: Self-pay | Admitting: Urology

## 2024-04-24 ENCOUNTER — Ambulatory Visit (INDEPENDENT_AMBULATORY_CARE_PROVIDER_SITE_OTHER): Admitting: Urology

## 2024-04-24 VITALS — BP 146/84 | HR 87

## 2024-04-24 DIAGNOSIS — N3946 Mixed incontinence: Secondary | ICD-10-CM

## 2024-04-24 LAB — URINALYSIS, COMPLETE
Bilirubin, UA: NEGATIVE
Glucose, UA: NEGATIVE
Ketones, UA: NEGATIVE
Leukocytes,UA: NEGATIVE
Nitrite, UA: NEGATIVE
Protein,UA: NEGATIVE
RBC, UA: NEGATIVE
Specific Gravity, UA: 1.015 (ref 1.005–1.030)
Urobilinogen, Ur: 0.2 mg/dL (ref 0.2–1.0)
pH, UA: 6 (ref 5.0–7.5)

## 2024-04-24 LAB — MICROSCOPIC EXAMINATION: Bacteria, UA: NONE SEEN

## 2024-04-24 MED ORDER — TOLTERODINE TARTRATE ER 4 MG PO CP24: 4.0000 mg | ORAL_CAPSULE | Freq: Every day | ORAL | 3 refills | Status: AC

## 2024-04-24 MED ORDER — GEMTESA 75 MG PO TABS: 75.0000 mg | ORAL_TABLET | Freq: Every day | ORAL | 11 refills | Status: AC

## 2024-04-24 MED ORDER — GEMTESA 75 MG PO TABS: 75.0000 mg | ORAL_TABLET | Freq: Every day | ORAL | 0 refills | Status: AC

## 2024-04-24 NOTE — Patient Instructions (Signed)

## 2024-04-24 NOTE — Progress Notes (Signed)
 04/24/2024 1:25 PM   Bonnie Gomez [birth year] [id number]  Referring provider: Donal Channing SQUIBB, FNP [address] [place],  [place] [address]  Chief Complaint  Patient presents with   Follow-up    HPI: Reviewed old chart and added the following Today Incontinence much better.  Still does some bathroom mapping.  Still leaks but much better on the combination.  Insurance would not cover Toviaz .  They will cover Myrbetriq  and Myrbetriq  prescription renewed.  2 months 8 mg Apples given.  She can come by and get samples when she wants.  See me in a year.  Clinically not infected today       Urge incontinence dramatically improved on Toviaz  and Myrbetriq .  90x3 Myrbetriq  sent.  She comes in and gets Toviaz  samples.  Clinically not infected.  Frequency stable.   Today Incontinence dramatically better on a combination of Detrol  and Myrbetriq . See in one year    Today Incontinence improved.  She leaks a little bit when she gets out of a chair associated with urgency.  Overall much better but still leaking some.  Clinically not infected  Patient will stay on Detrol  and Myrbetriq . Both prescriptions renewed 90 x 3. She would like to see physical therapy. She was asking if paperwork could be filled out for pads   Today Frequency stable Patient now wearing 4 pads a day for mixed incontinence.  On Detrol  and Myrbetriq .  Has bedwetting.  Is on home oxygen.  Has failed Toviaz  and Vesicare  and oxybutynin .  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   Hiatal hernia    Hypertension    Insomnia    Overactive bladder    Pneumonia    2013   Sleep apnea    Thyroid disease    Vitamin D  deficiency     Surgical History: Past Surgical History:  Procedure Laterality Date   ANGIOPLASTY  2008, 2012   CARDIAC CATHETERIZATION     CHOLECYSTECTOMY  1985   COLONOSCOPY  2013   COLONOSCOPY N/A 12/01/2023   Procedure: COLONOSCOPY;  Surgeon: Toledo, Ladell MARLA, MD;  Location: [facility] ENDOSCOPY;  Service: Gastroenterology;  Laterality: N/A;   COLONOSCOPY WITH PROPOFOL  N/A 10/27/2023   Procedure: COLONOSCOPY WITH PROPOFOL ;  Surgeon: Toledo, Ladell MARLA, MD;  Location: [facility] ENDOSCOPY;  Service: Gastroenterology;  Laterality: N/A;   ESOPHAGEAL DILATION  10/27/2023   Procedure: DILATION, ESOPHAGUS;  Surgeon: Toledo, Teodoro K, MD;  Location: [facility] ENDOSCOPY;  Service: Gastroenterology;;   ESOPHAGOGASTRODUODENOSCOPY (EGD) WITH PROPOFOL  N/A 10/27/2023   Procedure: ESOPHAGOGASTRODUODENOSCOPY (EGD) WITH PROPOFOL ;  Surgeon: Toledo, Ladell MARLA, MD;  Location: [facility] ENDOSCOPY;  Service: Gastroenterology;  Laterality: N/A;   GYNECOLOGIC CRYOSURGERY  1993   SKIN BIOPSY  2006   TUBAL LIGATION  1992   UPPER GI ENDOSCOPY  2013   [place]    Home Medications:  Allergies as of 04/24/2024       Reactions   Duloxetine Other (See Comments)   Agitation   Varenicline Other (See Comments)   Agitation   Duloxetine Hcl Anxiety   Agitation  Medication List        Accurate as of April 24, 2024  1:25 PM. If you have any questions, ask your nurse or doctor.          albuterol  (2.5 MG/3ML) 0.083% nebulizer solution Commonly known as: PROVENTIL  INHALE 1 VIAL USING NEBULIZER EVERY SIX HOURS AS NEEDED   Arnuity Ellipta  100 MCG/ACT Aepb Generic drug: Fluticasone  Furoate Inhale into the lungs.   aspirin  EC 81 MG tablet Take 81 mg by mouth every morning.   BD Integra Syringe 25G X 1 3 ML Misc Generic drug: SYRINGE-NEEDLE (DISP) 3 ML USE WITH B12   butalbital-acetaminophen -caffeine 50-325-40 MG tablet Commonly known as: FIORICET Take 1 tablet by mouth every 4 (four) hours as needed for  headache.   cephALEXin  500 MG capsule Commonly known as: KEFLEX  Take 1 capsule (500 mg total) by mouth 3 (three) times daily.   clonazePAM  0.5 MG tablet Commonly known as: KLONOPIN  Take 0.5 mg by mouth 2 (two) times daily as needed.   colestipol 1 g tablet Commonly known as: COLESTID   cyanocobalamin 1000 MCG tablet Commonly known as: VITAMIN B12 Take by mouth.   Daliresp 500 MCG Tabs tablet Generic drug: roflumilast Take 500 mcg by mouth every evening.   dexlansoprazole 60 MG capsule Commonly known as: DEXILANT Take 1 capsule by mouth daily as needed.   DULoxetine 60 MG capsule Commonly known as: CYMBALTA   estradiol  0.5 MG tablet Commonly known as: ESTRACE  Take 1 tablet (0.5 mg total) by mouth daily. PATIENT WILL NEED AN APPOINTMENT BEFORE ANY FURTHER REFILLS GIVEN   fentaNYL  75 MCG/HR Commonly known as: DURAGESIC  1 patch every other day.   fentaNYL  12 MCG/HR Commonly known as: DURAGESIC  Place 1 patch onto the skin every other day. Pt taking 75 mcg   ferrous sulfate 325 (65 FE) MG EC tablet Take by mouth.   Flovent HFA 220 MCG/ACT inhaler Generic drug: fluticasone  Take by mouth.   FLUoxetine 40 MG capsule Commonly known as: PROZAC   furosemide  40 MG tablet Commonly known as: LASIX  Take 40 mg by mouth daily as needed for fluid or edema.   gabapentin  100 MG capsule Commonly known as: NEURONTIN  Take by mouth.   ketoconazole 2 % cream Commonly known as: NIZORAL APPLY A SMALL AMOUNT TOPICALLY TWO TIMES A DAY   losartan 25 MG tablet Commonly known as: COZAAR Take 25 mg by mouth daily.   medroxyPROGESTERone  2.5 MG tablet Commonly known as: PROVERA  Take 1 tablet (2.5 mg total) by mouth daily.   mirabegron  ER 50 MG Tb24 tablet Commonly known as: Myrbetriq  Take 1 tablet (50 mg total) by mouth daily.   mometasone 0.1 % cream Commonly known as: ELOCON Apply 1 application topically 2 (two) times daily.   mometasone 50 MCG/ACT nasal spray Commonly  known as: NASONEX Place into the nose.   nystatin powder Commonly known as: MYCOSTATIN/NYSTOP APPLY SMALL AMOUNT TOPICALLY TWO TIMES A DAY AS NEEDED   oxyCODONE -acetaminophen  10-325 MG tablet Commonly known as: PERCOCET Take 1 tablet by mouth every 4 (four) hours as needed for pain.   pantoprazole 40 MG tablet Commonly known as: PROTONIX Take 40 mg by mouth daily.   potassium chloride SA 20 MEQ tablet Commonly known as: KLOR-CON M Take 20 mEq by mouth daily.   Qvar RediHaler 80 MCG/ACT inhaler Generic drug: beclomethasone SMARTSIG:2 Puff(s) By Mouth Twice Daily   rizatriptan 10 MG disintegrating tablet Commonly known as: MAXALT-MLT Take by mouth.   rosuvastatin 20 MG tablet Commonly  known as: CRESTOR SMARTSIG:1 Tablet(s) By Mouth Every Evening   Stiolto Respimat 2.5-2.5 MCG/ACT Aers Generic drug: Tiotropium Bromide -Olodaterol SMARTSIG:2 Puff(s) Via Inhaler Daily   terbinafine 250 MG tablet Commonly known as: LAMISIL Take 250 mg by mouth daily.   tiZANidine 4 MG tablet Commonly known as: ZANAFLEX Take 4 mg by mouth 3 (three) times daily as needed.   tolterodine  4 MG 24 hr capsule Commonly known as: DETROL  LA Take 1 capsule (4 mg total) by mouth daily.   valACYclovir 500 MG tablet Commonly known as: VALTREX Take 500 mg by mouth daily.   varenicline 1 MG tablet Commonly known as: CHANTIX Take 1 mg by mouth 2 (two) times daily.   Vitamin D3 125 MCG (5000 UT) Caps Take 10,000 Units by mouth every morning.        Allergies:  Allergies  Allergen Reactions   Duloxetine Other (See Comments)    Agitation   Varenicline Other (See Comments)    Agitation   Duloxetine Hcl Anxiety    Agitation    Family History: Family History  Problem Relation Age of Onset   Cancer Mother        lung   Cancer Father        lung   Cancer Maternal Grandmother        breast   Breast cancer Maternal Grandmother    Scoliosis Brother    Arthritis Brother    Uterine  cancer Paternal Grandmother    Breast cancer Cousin    Bladder Cancer Neg Hx    Kidney cancer Neg Hx     Social History:  reports that she has been smoking cigarettes. She has a 35 pack-year smoking history. She has been exposed to tobacco smoke. She has never used smokeless tobacco. She reports that she does not currently use drugs. She reports that she does not drink alcohol.  ROS:                                        Physical Exam: There were no vitals taken for this visit.  Constitutional:  Alert and oriented, No acute distress. HEENT: [place] AT, moist mucus membranes.  Trachea midline, no masses.   Laboratory Data: Lab Results  Component Value Date   WBC 10.1 04/25/2021   HGB 14.8 04/25/2021   HCT 42.2 04/25/2021   MCV 93.6 04/25/2021   PLT 225 04/25/2021    Lab Results  Component Value Date   CREATININE 0.88 04/25/2021    No results found for: PSA  Lab Results  Component Value Date   TESTOSTERONE  19 06/17/2017    No results found for: HGBA1C  Urinalysis    Component Value Date/Time   COLORURINE YELLOW (A) 10/01/2016 1540   APPEARANCEUR Clear 02/15/2023 1348   LABSPEC 1.008 10/01/2016 1540   LABSPEC 1.018 10/18/2012 1346   PHURINE 5.0 10/01/2016 1540   GLUCOSEU Negative 02/15/2023 1348   GLUCOSEU Negative 10/18/2012 1346   HGBUR NEGATIVE 10/01/2016 1540   BILIRUBINUR Negative 02/15/2023 1348   BILIRUBINUR Negative 10/18/2012 1346   KETONESUR NEGATIVE 10/01/2016 1540   PROTEINUR Trace (A) 02/15/2023 1348   PROTEINUR NEGATIVE 10/01/2016 1540   NITRITE Negative 02/15/2023 1348   NITRITE NEGATIVE 10/01/2016 1540   LEUKOCYTESUR Negative 02/15/2023 1348   LEUKOCYTESUR Negative 10/18/2012 1346    Pertinent Imaging:   Assessment & Plan: Patient has refractory mixed incontinence but is not  as bad as she was at baseline.  She is on home oxygen which limits some of the treatments.  She was short of breath even sitting.  Role of  Gemtesa samples discussed.  Role of percutaneous tibial nerve stimulation discussed.  Patient wants to try the [facility] and we scheduled PTNS.  If she does well on Gemtesa in combination with the Detrol  and she can afford it she will cancel the PTNS but otherwise proceed She is motivated to improve  1. Mixed incontinence (Primary)  - Urinalysis, Complete   No follow-ups on file.  Glendia DELENA Elizabeth, MD  [facility] [address] [place], [place] [address] [phone]

## 2024-05-02 ENCOUNTER — Ambulatory Visit (INDEPENDENT_AMBULATORY_CARE_PROVIDER_SITE_OTHER): Admitting: Vascular Surgery

## 2025-04-23 ENCOUNTER — Ambulatory Visit: Admitting: Urology
